# Patient Record
Sex: Female | Born: 1970
Health system: Southern US, Community
[De-identification: ages and names within clinical notes are randomized; demographics above are authoritative.]

## PROBLEM LIST (undated history)

## (undated) DIAGNOSIS — E785 Hyperlipidemia, unspecified: Secondary | ICD-10-CM

## (undated) DIAGNOSIS — I10 Essential (primary) hypertension: Secondary | ICD-10-CM

## (undated) DIAGNOSIS — K219 Gastro-esophageal reflux disease without esophagitis: Secondary | ICD-10-CM

## (undated) DIAGNOSIS — Z8719 Personal history of other diseases of the digestive system: Secondary | ICD-10-CM

## (undated) HISTORY — DX: Hyperlipidemia, unspecified: E78.5

## (undated) HISTORY — DX: Gastro-esophageal reflux disease without esophagitis: K21.9

## (undated) HISTORY — PX: TONSILLECTOMY: SUR1361

## (undated) HISTORY — PX: TUBAL LIGATION: SHX77

## (undated) HISTORY — PX: KNEE SURGERY: SHX244

---

## 2003-03-12 ENCOUNTER — Emergency Department (HOSPITAL_COMMUNITY): Admission: EM | Admit: 2003-03-12 | Discharge: 2003-03-12 | Payer: Self-pay

## 2004-08-26 ENCOUNTER — Emergency Department (HOSPITAL_COMMUNITY): Admission: EM | Admit: 2004-08-26 | Discharge: 2004-08-26 | Payer: Self-pay | Admitting: Emergency Medicine

## 2004-10-11 ENCOUNTER — Ambulatory Visit (HOSPITAL_BASED_OUTPATIENT_CLINIC_OR_DEPARTMENT_OTHER): Admission: RE | Admit: 2004-10-11 | Discharge: 2004-10-11 | Payer: Self-pay | Admitting: Orthopedic Surgery

## 2004-10-11 ENCOUNTER — Ambulatory Visit (HOSPITAL_COMMUNITY): Admission: RE | Admit: 2004-10-11 | Discharge: 2004-10-11 | Payer: Self-pay | Admitting: Orthopedic Surgery

## 2007-01-28 ENCOUNTER — Emergency Department (HOSPITAL_COMMUNITY): Admission: EM | Admit: 2007-01-28 | Discharge: 2007-01-28 | Payer: Self-pay | Admitting: Emergency Medicine

## 2007-03-01 ENCOUNTER — Emergency Department (HOSPITAL_COMMUNITY): Admission: EM | Admit: 2007-03-01 | Discharge: 2007-03-02 | Payer: Self-pay | Admitting: Emergency Medicine

## 2007-05-12 ENCOUNTER — Emergency Department (HOSPITAL_COMMUNITY): Admission: EM | Admit: 2007-05-12 | Discharge: 2007-05-12 | Payer: Self-pay | Admitting: Emergency Medicine

## 2007-05-20 ENCOUNTER — Other Ambulatory Visit (HOSPITAL_COMMUNITY): Admission: RE | Admit: 2007-05-20 | Discharge: 2007-06-08 | Payer: Self-pay | Admitting: Psychiatry

## 2007-06-14 ENCOUNTER — Other Ambulatory Visit: Admission: RE | Admit: 2007-06-14 | Discharge: 2007-06-14 | Payer: Self-pay | Admitting: Gynecology

## 2010-08-02 NOTE — Op Note (Signed)
NAME:  Sharon Hunt, Sharon Hunt              ACCOUNT NO.:  [id number]   MEDICAL RECORD NO.:  [id number]          PATIENT TYPE:  AMB   LOCATION:  DSC                          FACILITY:  [facility]   PHYSICIAN:  Harvie Junior, M.D.   DATE OF BIRTH:  1970/10/12   DATE OF PROCEDURE:  10/11/2004  DATE OF DISCHARGE:                                 OPERATIVE REPORT   PREOPERATIVE DIAGNOSIS:  Medial meniscal tear.   POSTOPERATIVE DIAGNOSES:  1.  Medial meniscal tear  2.  Synovitis, medial and lateral.   OPERATION PERFORMED:  1.  Debridement of anterior horn medial meniscal tear.  2.  Debridement of the synovitis medially and laterally.   SURGEON:  Harvie Junior, M.D.   ASSISTANT:  None.   ANESTHESIA:  General.   INDICATIONS FOR PROCEDURE:  The patient is a 40 year old female with a long  history of slip and fall type injury.  She is having severe type pain,  difficulty ranging the knee, difficulty being examined.  MRI was obtained  which showed an oblique medial meniscal tear and we examined her in the  office.  At that point we felt that she was having pain that was certainly  way out of proportion and could not be simply related to medial meniscal  tear.  We felt it would be important at that point to talk to her about  that, which we did address.  We got her into physical therapy.  We put her  on some nerve type medicine to see if that would not calm her pain down.  She continued to have pain and we felt that ultimately she would need to  have her meniscus tear addressed and felt that it is probably best to do  ahead and get it addressed and then address the other pain issues  simultaneously.  She was brought to the operating room for this procedure.   DESCRIPTION OF PROCEDURE:  The patient was brought to the operating room and  after adequate anesthesia was obtained with general anesthetic, the patient  was placed on the operating table where examination under anesthesia was  stable in all  directions.  Attention was then turned to the left knee which  was prepped and draped in the usual sterile fashion.  Following this,  routine arthroscopic examination of the knee revealed that there was no  significant patellofemoral abnormality, no cartilage abnormalities.  There  was no significant medial shelf plica.  We went down to the medial  compartment and the medial meniscus was probed at length. There was no  separation of the medial meniscus from the capsular structures.  There was  some looseness of the medial meniscus, not dramatic.  There was a small  anterior horn tear which was debrided which went around into an anterior  plica.  The ACL was examined and stressed and noted to be normal.  Lateral  compartment was examined and noted to be normal.  Lateral gutter had a mild  amount of synovitis which was debrided.  The articular surfaces were  evaluated at length with a probe through both  flexion and extension and up  under the patella, the articular cartilage was probed at length to look for  any kind of soft or irregular cartilage.  None was found.  Following this,  the knee was copiously irrigated and suctioned dry.  The arthroscope portals  were closed with a bandage.  Sterile compressive dressing was applied.  The  patient was then transferred to the recovery room where she was noted to be  in satisfactory condition.  Postoperatively I think it would be important  for the patient to be aggressively into physical therapy with early motion.  We may need to treat her for any kind of nerve related issues.  I do not  think her intra-articular pathology would explain the symptoms that she is  having.  We have discussed this preoperatively.  I will see her back in the  office in a week for recheck.      Harvie Junior, M.D.  Electronically Signed     JLG/MEDQ  D:  10/11/2004  T:  10/11/2004  Job:  [id number]

## 2010-10-16 ENCOUNTER — Inpatient Hospital Stay (HOSPITAL_COMMUNITY)
Admission: EM | Admit: 2010-10-16 | Discharge: 2010-10-19 | DRG: 305 | Disposition: A | Discharge status: Home / Self Care | Payer: Medicaid Other | Attending: Internal Medicine | Admitting: Internal Medicine

## 2010-10-16 ENCOUNTER — Emergency Department (HOSPITAL_COMMUNITY): Payer: Medicaid Other

## 2010-10-16 DIAGNOSIS — R1110 Vomiting, unspecified: Secondary | ICD-10-CM | POA: Diagnosis present

## 2010-10-16 DIAGNOSIS — E876 Hypokalemia: Secondary | ICD-10-CM | POA: Diagnosis present

## 2010-10-16 DIAGNOSIS — K449 Diaphragmatic hernia without obstruction or gangrene: Secondary | ICD-10-CM | POA: Diagnosis present

## 2010-10-16 DIAGNOSIS — E785 Hyperlipidemia, unspecified: Secondary | ICD-10-CM | POA: Diagnosis present

## 2010-10-16 DIAGNOSIS — F17200 Nicotine dependence, unspecified, uncomplicated: Secondary | ICD-10-CM | POA: Diagnosis present

## 2010-10-16 DIAGNOSIS — I10 Essential (primary) hypertension: Principal | ICD-10-CM | POA: Diagnosis present

## 2010-10-16 LAB — DIFFERENTIAL
Basophils Absolute: 0 10*3/uL (ref 0.0–0.1)
Basophils Relative: 0 % (ref 0–1)
Eosinophils Absolute: 0.2 10*3/uL (ref 0.0–0.7)
Lymphocytes Relative: 34 % (ref 12–46)
Monocytes Absolute: 0.7 10*3/uL (ref 0.1–1.0)
Monocytes Relative: 9 % (ref 3–12)
Neutrophils Relative %: 55 % (ref 43–77)

## 2010-10-16 LAB — BASIC METABOLIC PANEL
BUN: 5 mg/dL — ABNORMAL LOW (ref 6–23)
CO2: 25 mEq/L (ref 19–32)
GFR calc Af Amer: >60 mL/min (ref >60)
Glucose, Bld: 90 mg/dL (ref 70–99)

## 2010-10-16 LAB — CBC
Hemoglobin: 11.4 g/dL — ABNORMAL LOW (ref 12.0–15.0)
Platelets: 244 10*3/uL (ref 150–400)
WBC: 8 10*3/uL (ref 4.0–10.5)

## 2010-10-16 LAB — CK TOTAL AND CKMB (NOT AT ARMC)
CK, MB: 2.3 ng/mL (ref 0.3–4.0)
Total CK: 172 U/L (ref 7–177)

## 2010-10-16 LAB — TROPONIN I: Troponin I: <0.3 ng/mL (ref <0.30)

## 2010-10-17 ENCOUNTER — Emergency Department (HOSPITAL_COMMUNITY): Payer: Medicaid Other

## 2010-10-17 LAB — CARDIAC PANEL(CRET KIN+CKTOT+MB+TROPI)
CK, MB: 2.1 ng/mL (ref 0.3–4.0)
Relative Index: 1.7 (ref 0.0–2.5)
Relative Index: 1.5 (ref 0.0–2.5)
Total CK: 126 U/L (ref 7–177)
Total CK: 136 U/L (ref 7–177)
Troponin I: <0.3 ng/mL (ref <0.30)
Troponin I: <0.3 ng/mL (ref <0.30)

## 2010-10-17 LAB — LIPASE, BLOOD: Lipase: 14 U/L (ref 11–59)

## 2010-10-17 LAB — LIPID PANEL
Total CHOL/HDL Ratio: 2.5 RATIO
Triglycerides: 56 mg/dL (ref <150)

## 2010-10-17 LAB — T4, FREE: Free T4: 0.89 ng/dL (ref 0.80–1.80)

## 2010-10-17 LAB — T3, FREE: T3, Free: 2.5 pg/mL (ref 2.3–4.2)

## 2010-10-17 MED ORDER — IOHEXOL 350 MG/ML SOLN
100.0000 mL | Freq: Once | INTRAVENOUS | Status: AC | PRN
  Administered 2010-10-17: 100 mL via INTRAVENOUS

## 2010-10-17 NOTE — H&P (Signed)
NAME:  Sharon Hunt, Sharon Hunt              ACCOUNT NO.:  [id number]  MEDICAL RECORD NO.:  [id number]  LOCATION:                                 FACILITY:  PHYSICIAN:  Talmage Nap, MD  DATE OF BIRTH:  12-13-1970  DATE OF ADMISSION:  10/17/2010 DATE OF DISCHARGE:                             HISTORY & PHYSICAL   PRIMARY CARE PHYSICIAN:  Unassigned.  History was obtainable from the patient and the patient's son.  CHIEF COMPLAINT:  Headache and chest pain which started suddenly on October 16, 2010, in the evening.  The patient is a 40 year old African American female with history of hypertension who was said to have been in stable health until about evening of October 16, 2010, when the patient developed sudden occipital headache.  The patient claimed that was the worst headache of her life and was more than 10/10 in intensity.  The occipital head was radiating down to the neck posteriorly with associated nausea.  The patient denied any history of neck stiffness.  She denied any fever.  She denied any chills.  She denied any rigor or nuchal rigidity.She claimed she had precordial discomfort with associated nausea.  She denied any diaphoresis.  She denied any PND.  She denied any orthopnea.  She denied any systemic symptoms, i.e., no fever, no chills, no rigor, and subsequently presented to the emergency room to be evaluated.  In the ED, the patient was seen.  She was found to have a blood pressure of 211/118 and subsequently was given IV Lopressor and clonidine.  By the time the patient was seen by me, she was still complaining of occipital headache, lesser in intensity with nausea but no vomiting, no systemic symptoms, i.e., no fever, no chills, no rigor.  Her blood pressure, however, was now under control, 139/94, pulse was 74, respiratory rate 15.  After evaluating the patient, she was advised to be admitted for stabilization of blood pressure and also to rule out aneurysm  or subarachnoid hemorrhage.  PAST MEDICAL HISTORY:  Positive for hypertension and hiatal hernia.  PAST SURGICAL HISTORY: 1. Tummy tuck surgery. 2. Knee surgery. 3. Tonsillectomy. 4. Tubal ligation.  PREADMISSION MEDICATIONS:  Lisinopril 20 mg p.o. daily and omeprazole.  She has no known allergy.  SOCIAL HISTORY:  The patient smokes one pack of cigarettes over 3-4 days for the past 20 years.  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  FAMILY HISTORY:  York Spaniel to be positive for hypertension and early coronary artery disease.  REVIEW OF SYSTEMS:  The patient complained of occipital headache with nausea.  Denies any vomiting.  No fever.  No chills.  No rigor.  No neck stiffness.  She also complained about chest pain that is generalized with associated nausea.  She also denied any associated fever, chills, or rigor.  Also complained of abdominal discomfort, mainly located in the epigastric region.  Denies any diarrhea or hematochezia.  No dysuria or hematuria.  No swelling of the lower extremities.  No intolerance to heat or cold and no neuropsychiatric disorder.  PHYSICAL EXAMINATION:  GENERAL:  A young lady, overweight, not in acute  respiratory distress but holding tight to her head. INITIAL VITAL SIGNS:  Blood pressure on admission 211/118, pulse 84, respiratory rate 20, however, after IV Lopressor and clonidine, present blood pressure is 113/94, pulse is 74, respiratory rate is 15. HEENT:  Pupils are reactive to light, and extraocular muscles are intact. NECK:  No jugular venous distention.  No carotid bruit.  No lymphadenopathy. CHEST:  Clear to auscultation. HEART:  Heart sounds are one and two. ABDOMEN:  Soft, nontender.  Liver, spleen, and kidney not palpable. Bowel sounds are positive. EXTREMITIES:  No pedal edema. NEUROLOGIC:  Nonfocal. MUSCULOSKELETAL:  Unremarkable. SKIN:  Normal  turgor.  LABORATORY DATA:  Initial hematological indices showed sodium of 141, potassium of 3.4, chloride of 105 with a bicarb of 25, glucose is 90, BUN is 5, creatinine 0.80.  D-dimer is 0.62.  First set of cardiac enzymes:  Troponin I less than 0.30.  Hematological indices showed WBC of 8.0, hemoglobin of 11.4, hematocrit of 32.3, MCV of 74.4 with a platelet count of 244 with normal differentials.  IMAGING STUDIES DONE:  CT of the thorax which was negative for pulmonary embolism. EKG showed normal sinus rhythm with a rate of 80 and LVH.  IMPRESSION: 1. Occipital headache, to rule out aneurysm or subarachnoid     hemorrhage. 2. Hypertensive emergency. 3. Hiatal hernia.  Plan is to admit the patient to telemetry.  The patient's blood pressure will be continued to be maintained with Lopressor 50 mg p.o. b.i.d. and lisinopril 10 mg p.o. b.i.d.  She will be on Zofran 4 mg IV q.4 h. p.r.n. for nausea and morphine 2 mg IV q.4 h. p.r.n. for pain.  Since the patient is found to be slightly hypokalemic, she will be on KCl 10 mEq p.o. b.i.d.  GI prophylaxis will be done with Protonix 40 mg IV q.24 h. and DVT prophylaxis with TED stockings.  Further workup to be done on this patient will include cardiac enzymes q.6 h. x3, thyroid panel which will include TSH, T3, and T4, lipid panel, and 2-D echo.  Imaging study to be done will include CT of the brain with and without contrast.  The patient will be followed and evaluated on daily basis.     Talmage Nap, MD     CN/MEDQ  D:  10/17/2010  T:  10/17/2010  Job:  [id number]  Electronically Signed by Talmage Nap  on 10/17/2010 06:49:28 PM

## 2010-10-18 ENCOUNTER — Inpatient Hospital Stay (HOSPITAL_COMMUNITY): Payer: Medicaid Other

## 2010-10-18 LAB — BASIC METABOLIC PANEL
Calcium: 10.1 mg/dL (ref 8.4–10.5)
Creatinine, Ser: 0.82 mg/dL (ref 0.50–1.10)
GFR calc Af Amer: >60 mL/min (ref >60)

## 2010-10-18 MED ORDER — GADOBENATE DIMEGLUMINE 529 MG/ML IV SOLN
16.0000 mL | Freq: Once | INTRAVENOUS | Status: AC | PRN
  Administered 2010-10-18: 16 mL via INTRAVENOUS

## 2010-11-27 NOTE — Discharge Summary (Signed)
  NAMEFRANKLIN, CLAPSADDLE              ACCOUNT NO.:  [id number]  MEDICAL RECORD NO.:  [id number]  LOCATION:  5506                         FACILITY:  [facility]  PHYSICIAN:  Labresha Mellor I Tamir Wallman, MD      DATE OF BIRTH:  1970-11-21  DATE OF ADMISSION:  10/16/2010 DATE OF DISCHARGE:  10/19/2010                              DISCHARGE SUMMARY   PRIMARY CARE PHYSICIAN:  Unassigned.  DISCHARGE DIAGNOSES: 1. Hypertension urgency. 2. Headache, unlikely from hypertensive urgency. 3. Acute-on-chronic vomiting, resolved. 4. Hypokalemia.  DISCHARGE MEDICATIONS: 1. Lisinopril/hydrochlorothiazide 20/25 one tablet daily. 2. Norvasc 5 mg p.o. daily. 3. Ativan 0.5 mg p.o. daily p.r.n.  CONSULTATION:  None.  PROCEDURE: 1. Chest x-ray.  No active disease. 2. CT head without contrast.  Negative. 3. MRI of the brain.  No evidence of empty sella or dilated perioptic     space, mild exophthalmos, no acute infarct, scattered minimal     punctate, nonspecific white matter-type changes.  Minimal partial     opacification, inferior aspect of right mastoid air cells. 4. 2-D echo.  EF of 60-65%, wall motion within normal.  There were no     regional wall motion abnormalities.  HISTORY OF PRESENT ILLNESS:  This is a 40 year old African American female with history of hypertension.  On the evening of August 3, the patient developed occipital headache and headache was radiating to the neck posteriorly with associated nausea.  Denies any history of neck stiffness.  Denies any fever.  Associated with bradycardia, discomfort, and nausea.  No fever, no chills.  In the ED, the patient found to have blood pressure of 211/118. 1. Hypertensive urgency.  Blood pressure improved and currently the     patient will be discharge with lisinopril/HCTZ 20/25 mg p.o. daily     and Norvasc 5 mg p.o. daily. 2. Hyperlipidemia.  The patient educated about diet and within 6     months, if there is no significant improvement, medication  will be     indicated. 3. Chest pain, atypical.  Cardiac enzymes cycled, which were negative     and 2-D echo did show EF of 60% with normal EF. 4. Headache, which could be related to the hypertension urgency.  MRI     of the brain done, which did not show     any evidence of hemorrhage or aneurysm.  Also, CT head done, was     negative.  Currently, headache completely resolved.  Currently, we     felt the patient is medically stable for discharge. 5. Follow up wit her primary care as early as next week.  The patient     informed.     Ronneisha Jett Bosie Helper, MD     HIE/MEDQ  D:  10/19/2010  T:  10/19/2010  Job:  [id number]  Electronically Signed by Ebony Cargo MD on 11/27/2010 03:33:25 PM

## 2010-12-06 LAB — POCT CARDIAC MARKERS
CKMB, poc: <1 — ABNORMAL LOW
Myoglobin, poc: 56.8
Troponin i, poc: <0.05

## 2010-12-06 LAB — I-STAT 8, (EC8 V) (CONVERTED LAB)
BUN: 13
Bicarbonate: 23.6
Glucose, Bld: 73
TCO2: 25
pH, Ven: 7.394 — ABNORMAL HIGH

## 2010-12-06 LAB — POCT I-STAT CREATININE: Operator id: 288331

## 2010-12-20 LAB — I-STAT 8, (EC8 V) (CONVERTED LAB)
Acid-Base Excess: 1
Bicarbonate: 26.3 — ABNORMAL HIGH
HCT: 40
Hemoglobin: 13.6
Operator id: 151321
Sodium: 134 — ABNORMAL LOW
TCO2: 28
pCO2, Ven: 41.7 — ABNORMAL LOW

## 2010-12-20 LAB — DIFFERENTIAL
Basophils Absolute: 0.1
Basophils Relative: 0
Lymphocytes Relative: 11 — ABNORMAL LOW
Monocytes Absolute: 1
Neutro Abs: 14.8 — ABNORMAL HIGH
Neutrophils Relative %: 83 — ABNORMAL HIGH

## 2010-12-20 LAB — PREGNANCY, URINE: Preg Test, Ur: NEGATIVE

## 2010-12-20 LAB — CBC
Hemoglobin: 12.4
RBC: 4.36
RDW: 17 — ABNORMAL HIGH

## 2010-12-20 LAB — URINE CULTURE

## 2010-12-20 LAB — URINALYSIS, ROUTINE W REFLEX MICROSCOPIC
Glucose, UA: 100 — AB
Ketones, ur: 15 — AB
Nitrite: POSITIVE — AB
Specific Gravity, Urine: 1.036 — ABNORMAL HIGH
pH: 5.5

## 2010-12-20 LAB — HEPATIC FUNCTION PANEL
ALT: 10
AST: 42 — ABNORMAL HIGH
Albumin: 3.6
Bilirubin, Direct: 0.7 — ABNORMAL HIGH
Total Protein: 6.7

## 2010-12-20 LAB — URINE MICROSCOPIC-ADD ON

## 2010-12-20 LAB — WET PREP, GENITAL
Clue Cells Wet Prep HPF POC: NONE SEEN
Trich, Wet Prep: NONE SEEN
Yeast Wet Prep HPF POC: NONE SEEN

## 2010-12-20 LAB — POCT I-STAT CREATININE: Creatinine, Ser: 1.1

## 2010-12-20 LAB — POTASSIUM: Potassium: 3.9

## 2010-12-20 LAB — GC/CHLAMYDIA PROBE AMP, GENITAL: GC Probe Amp, Genital: POSITIVE — AB

## 2010-12-24 LAB — COMPREHENSIVE METABOLIC PANEL
AST: 13
Albumin: 3.4 — ABNORMAL LOW
BUN: 6
Creatinine, Ser: 0.83
GFR calc Af Amer: >60
Total Protein: 6.5

## 2010-12-24 LAB — CBC
HCT: 33.7 — ABNORMAL LOW
Hemoglobin: 10.9 — ABNORMAL LOW
MCHC: 32.3
Platelets: 338
RDW: 12.7

## 2010-12-24 LAB — URINALYSIS, ROUTINE W REFLEX MICROSCOPIC
Hgb urine dipstick: NEGATIVE
Nitrite: NEGATIVE
Protein, ur: NEGATIVE
Urobilinogen, UA: 0.2

## 2010-12-24 LAB — DIFFERENTIAL
Eosinophils Relative: 3
Lymphocytes Relative: 14
Lymphs Abs: 1.2
Monocytes Absolute: 0.2
Monocytes Relative: 2 — ABNORMAL LOW
Neutro Abs: 6.9

## 2011-08-19 DIAGNOSIS — E876 Hypokalemia: Secondary | ICD-10-CM | POA: Diagnosis present

## 2011-08-20 ENCOUNTER — Inpatient Hospital Stay (HOSPITAL_COMMUNITY)
Admission: EM | Admit: 2011-08-20 | Discharge: 2011-08-22 | DRG: 556 | Disposition: A | Discharge status: Home / Self Care | Payer: MEDICAID | Source: Ambulatory Visit | Attending: Family Medicine | Admitting: Family Medicine

## 2011-08-20 ENCOUNTER — Emergency Department (HOSPITAL_COMMUNITY): Payer: Self-pay

## 2011-08-20 ENCOUNTER — Encounter (HOSPITAL_COMMUNITY): Payer: Self-pay | Admitting: Emergency Medicine

## 2011-08-20 DIAGNOSIS — F1010 Alcohol abuse, uncomplicated: Secondary | ICD-10-CM | POA: Diagnosis present

## 2011-08-20 DIAGNOSIS — F10929 Alcohol use, unspecified with intoxication, unspecified: Secondary | ICD-10-CM | POA: Diagnosis present

## 2011-08-20 DIAGNOSIS — E876 Hypokalemia: Secondary | ICD-10-CM | POA: Diagnosis present

## 2011-08-20 DIAGNOSIS — I498 Other specified cardiac arrhythmias: Secondary | ICD-10-CM | POA: Diagnosis not present

## 2011-08-20 DIAGNOSIS — E538 Deficiency of other specified B group vitamins: Secondary | ICD-10-CM | POA: Diagnosis present

## 2011-08-20 DIAGNOSIS — D509 Iron deficiency anemia, unspecified: Secondary | ICD-10-CM | POA: Diagnosis present

## 2011-08-20 DIAGNOSIS — G40401 Other generalized epilepsy and epileptic syndromes, not intractable, with status epilepticus: Secondary | ICD-10-CM

## 2011-08-20 DIAGNOSIS — F17200 Nicotine dependence, unspecified, uncomplicated: Secondary | ICD-10-CM | POA: Diagnosis present

## 2011-08-20 DIAGNOSIS — R531 Weakness: Secondary | ICD-10-CM | POA: Diagnosis present

## 2011-08-20 DIAGNOSIS — R29898 Other symptoms and signs involving the musculoskeletal system: Principal | ICD-10-CM | POA: Diagnosis present

## 2011-08-20 DIAGNOSIS — Z7189 Other specified counseling: Secondary | ICD-10-CM

## 2011-08-20 DIAGNOSIS — I10 Essential (primary) hypertension: Secondary | ICD-10-CM | POA: Diagnosis present

## 2011-08-20 DIAGNOSIS — R404 Transient alteration of awareness: Secondary | ICD-10-CM

## 2011-08-20 DIAGNOSIS — E611 Iron deficiency: Secondary | ICD-10-CM | POA: Diagnosis present

## 2011-08-20 DIAGNOSIS — Z7982 Long term (current) use of aspirin: Secondary | ICD-10-CM

## 2011-08-20 DIAGNOSIS — R7989 Other specified abnormal findings of blood chemistry: Secondary | ICD-10-CM | POA: Diagnosis present

## 2011-08-20 DIAGNOSIS — Z9889 Other specified postprocedural states: Secondary | ICD-10-CM

## 2011-08-20 DIAGNOSIS — E785 Hyperlipidemia, unspecified: Secondary | ICD-10-CM | POA: Diagnosis present

## 2011-08-20 DIAGNOSIS — Z720 Tobacco use: Secondary | ICD-10-CM | POA: Diagnosis present

## 2011-08-20 DIAGNOSIS — Z79899 Other long term (current) drug therapy: Secondary | ICD-10-CM

## 2011-08-20 HISTORY — DX: Personal history of other diseases of the digestive system: Z87.19

## 2011-08-20 HISTORY — DX: Essential (primary) hypertension: I10

## 2011-08-20 LAB — COMPREHENSIVE METABOLIC PANEL
AST: 37 U/L (ref 0–37)
Albumin: 3.8 g/dL (ref 3.5–5.2)
Calcium: 8.9 mg/dL (ref 8.4–10.5)
Chloride: 105 mEq/L (ref 96–112)
Creatinine, Ser: 0.86 mg/dL (ref 0.50–1.10)
Sodium: 140 mEq/L (ref 135–145)
Total Bilirubin: 0.3 mg/dL (ref 0.3–1.2)

## 2011-08-20 LAB — CBC
MCH: 28.8 pg (ref 26.0–34.0)
Platelets: 203 10*3/uL (ref 150–400)
RBC: 3.99 MIL/uL (ref 3.87–5.11)
WBC: 9 10*3/uL (ref 4.0–10.5)

## 2011-08-20 LAB — APTT: aPTT: 27 seconds (ref 24–37)

## 2011-08-20 LAB — PROTIME-INR
INR: 1.02 (ref 0.00–1.49)
Prothrombin Time: 13.6 seconds (ref 11.6–15.2)

## 2011-08-20 LAB — GLUCOSE, CAPILLARY

## 2011-08-20 LAB — CK TOTAL AND CKMB (NOT AT ARMC)
CK, MB: 2.4 ng/mL (ref 0.3–4.0)
Relative Index: 0.8 (ref 0.0–2.5)

## 2011-08-20 LAB — RAPID URINE DRUG SCREEN, HOSP PERFORMED
Cocaine: NOT DETECTED
Opiates: NOT DETECTED

## 2011-08-20 MED ORDER — ONDANSETRON HCL 4 MG/2ML IJ SOLN
4.0000 mg | Freq: Once | INTRAMUSCULAR | Status: AC
  Administered 2011-08-20: 4 mg via INTRAVENOUS

## 2011-08-20 MED ORDER — LORAZEPAM 1 MG PO TABS
1.0000 mg | ORAL_TABLET | Freq: Four times a day (QID) | ORAL | Status: DC | PRN
  Filled 2011-08-20: qty 1

## 2011-08-20 MED ORDER — LORAZEPAM 2 MG/ML IJ SOLN: 0.0000 mg | Freq: Two times a day (BID) | INTRAMUSCULAR | Status: DC

## 2011-08-20 MED ORDER — THIAMINE HCL 100 MG/ML IJ SOLN: 100.0000 mg | Freq: Every day | INTRAMUSCULAR | Status: DC

## 2011-08-20 MED ORDER — LORAZEPAM 2 MG/ML IJ SOLN
0.0000 mg | Freq: Four times a day (QID) | INTRAMUSCULAR | Status: DC
  Administered 2011-08-20: 1 mg via INTRAVENOUS

## 2011-08-20 MED ORDER — FOLIC ACID 1 MG PO TABS: 1.0000 mg | ORAL_TABLET | Freq: Every day | ORAL | Status: DC

## 2011-08-20 MED ORDER — ADULT MULTIVITAMIN W/MINERALS CH
1.0000 | ORAL_TABLET | Freq: Every day | ORAL | Status: DC
  Filled 2011-08-20: qty 1

## 2011-08-20 MED ORDER — ONDANSETRON HCL 4 MG/2ML IJ SOLN: 4.0000 mg | Freq: Three times a day (TID) | INTRAMUSCULAR | Status: DC | PRN

## 2011-08-20 MED ORDER — SODIUM CHLORIDE 0.9 % IV SOLN
INTRAVENOUS | Status: DC
  Administered 2011-08-21: 75 mL/h via INTRAVENOUS

## 2011-08-20 MED ORDER — VITAMIN B-1 100 MG PO TABS: 100.0000 mg | ORAL_TABLET | Freq: Every day | ORAL | Status: DC

## 2011-08-20 MED ORDER — LORAZEPAM 2 MG/ML IJ SOLN
1.0000 mg | Freq: Four times a day (QID) | INTRAMUSCULAR | Status: DC | PRN
  Filled 2011-08-20: qty 1

## 2011-08-20 MED ORDER — ONDANSETRON HCL 4 MG/2ML IJ SOLN
INTRAMUSCULAR | Status: AC
  Filled 2011-08-20: qty 2

## 2011-08-20 NOTE — ED Notes (Signed)
Per EMS- pt was mowing the lawn when she collapsed. Her son called EMS. Pt had a couple of drinks prior to mowing the lawn. Pt has left sided facial droop and weakness. Pt stuttering but not slurring words. Pt also c/o chest pain. EMS didn't give any meds d/t pt unable speak clearly. EMS started IV 18G in Rt AC.

## 2011-08-20 NOTE — Code Documentation (Signed)
Patient arrived via EMS after being found by son's in grass while mowing lawn at 2140, patient admits having two wine coolers prior to mowing. Code stroke called at 2207, patient arrived at 2218, EDP exam at 2218, stroke team arrived at 2211, LSN 2140, patient arrived to CT at 2220, phlebotomist arrived at 2220, CT read by Dr. Roseanne Reno at 2230. Initial NIH 7 due to left side weakness, aphasia, and facial droop. UDS ordered Patient improved to NIH 2 with slight left arm weakness and aphasia. Failed swallow screen. Code stroke cancelled at 2248 by Dr. Roseanne Reno.

## 2011-08-20 NOTE — ED Notes (Signed)
Pt no longer has facial droop.

## 2011-08-20 NOTE — ED Notes (Signed)
Family in at bedside; plan of care discussed; pt attempting to get out of bed - instructed to stay in bed for safety reasons; family to assist in ensuring pt will stay in bed; stretcher in low position; side rails up x2

## 2011-08-20 NOTE — ED Notes (Signed)
Code Stroke cancelled, per Dr. Vonita Moss.

## 2011-08-20 NOTE — Consult Note (Signed)
[facility] CONSULT NOTE     Reason for Consult: Unresponsive followed by confusion and speech difficulty as well as reduced movement of left extremities.    HPI:    Sharon Hunt is an 41 y.o. female history of hypertension presenting following an episode of unresponsiveness followed by confusion as well as reduce speech output and reduced movement of left extremities. Patient was brought to the emergency room and code stroke status. Was noted to have reduce speech output but no dysarthria. She had good strength of left extremities and no facial asymmetry. There was reduced movement of left extremities voluntarily, compared to right extremities. CT scan of her head showed no acute intracranial abnormality. No previous history of loss of consciousness. Patient admits to alcohol consumption daily. Alcohol level is pending, as is urine drug screen. Patient's speech returned to normal in the emergency room after about 20-30 minutes. Speech and movement of her left extremities also returned to normal. Code stroke was canceled.   Past Medical History  Diagnosis Date  . Hypertension     History reviewed. No pertinent past surgical history.  History reviewed. No pertinent family history.  Social History:  does not have a smoking history on file. She does not have any smokeless tobacco history on file. She reports that she drinks alcohol. Her drug history not on file.  No Known Allergies  Medications:    Prior to Admission:  Norvasc 5 mg per day Aspirin 81 mg per day Lisinopril-hydrochlorothiazide 20-25 one tablet daily  Blood pressure 157/100, pulse 88, temperature 97.8 F (36.6 C), temperature source Oral, resp. rate 34, SpO2 100.00%.   Neurologic Examination:  Mental Status: Alert, oriented x3. Somewhat tremulous but no acute distress Speech fluent without evidence of aphasia. Able to follow commands without difficulty. Cranial Nerves: II-Visual  fields were normal. III/IV/VI-Pupils were equal and reacted. Extraocular movements were full and conjugate.    V/VII-no facial numbness and no facial weakness. VIII-normal. X-normal speech. XII-midline tongue extension Motor: 5/5 bilaterally with normal tone and bulk Sensory: Normal throughout. Deep Tendon Reflexes: Trace to 1+ and symmetric. Plantars: Flexor bilaterally Cerebellar: Normal finger-to-nose testing.   Lab Results  Component Value Date/Time   CHOL 203* 10/17/2010  6:40 AM    Results for orders placed during the hospital encounter of 08/20/11 (from the past 48 hour(s))  PROTIME-INR     Status: Normal   Collection Time   08/20/11 10:22 PM      Component Value Range Comment   Prothrombin Time 13.6  11.6 - 15.2 (seconds)    INR 1.02  0.00 - 1.49    APTT     Status: Normal   Collection Time   08/20/11 10:22 PM      Component Value Range Comment   aPTT 27  24 - 37 (seconds)   CBC     Status: Abnormal   Collection Time   08/20/11 10:22 PM      Component Value Range Comment   WBC 9.0  4.0 - 10.5 (K/uL)    RBC 3.99  3.87 - 5.11 (MIL/uL)    Hemoglobin 11.5 (*) 12.0 - 15.0 (g/dL)    HCT 82.9 (*) 56.2 - 46.0 (%)    MCV 82.7  78.0 - 100.0 (fL)    MCH 28.8  26.0 - 34.0 (pg)    MCHC 34.8  30.0 - 36.0 (g/dL)  RDW 21.4 (*) 11.5 - 15.5 (%)    Platelets 203  150 - 400 (K/uL)   DIFFERENTIAL     Status: Normal (Preliminary result)   Collection Time   08/20/11 10:22 PM      Component Value Range Comment   Neutrophils Relative PENDING  43 - 77 (%)    Neutro Abs PENDING  1.7 - 7.7 (K/uL)    Band Neutrophils PENDING  0 - 10 (%)    Lymphocytes Relative PENDING  12 - 46 (%)    Lymphs Abs PENDING  0.7 - 4.0 (K/uL)    Monocytes Relative PENDING  3 - 12 (%)    Monocytes Absolute PENDING  0.1 - 1.0 (K/uL)    Eosinophils Relative PENDING  0 - 5 (%)    Eosinophils Absolute PENDING  0.0 - 0.7 (K/uL)    Basophils Relative PENDING  0 - 1 (%)    Basophils Absolute PENDING  0.0 - 0.1 (K/uL)      WBC Morphology PENDING      RBC Morphology PENDING      Smear Review PENDING      nRBC PENDING  0 (/100 WBC)    Metamyelocytes Relative PENDING      Myelocytes PENDING      Promyelocytes Absolute PENDING      Blasts PENDING     COMPREHENSIVE METABOLIC PANEL     Status: Abnormal   Collection Time   08/20/11 10:22 PM      Component Value Range Comment   Sodium 140  135 - 145 (mEq/L)    Potassium 3.1 (*) 3.5 - 5.1 (mEq/L)    Chloride 105  96 - 112 (mEq/L)    CO2 22  19 - 32 (mEq/L)    Glucose, Bld 99  70 - 99 (mg/dL)    BUN 7  6 - 23 (mg/dL)    Creatinine, Ser 1.61  0.50 - 1.10 (mg/dL)    Calcium 8.9  8.4 - 10.5 (mg/dL)    Total Protein 6.8  6.0 - 8.3 (g/dL)    Albumin 3.8  3.5 - 5.2 (g/dL)    AST 37  0 - 37 (U/L)    ALT 16  0 - 35 (U/L)    Alkaline Phosphatase 72  39 - 117 (U/L)    Total Bilirubin 0.3  0.3 - 1.2 (mg/dL)    GFR calc non Af Amer 82 (*) >90 (mL/min)    GFR calc Af Amer >90  >90 (mL/min)   CK TOTAL AND CKMB     Status: Abnormal   Collection Time   08/20/11 10:22 PM      Component Value Range Comment   Total CK 318 (*) 7 - 177 (U/L)    CK, MB 2.4  0.3 - 4.0 (ng/mL)    Relative Index 0.8  0.0 - 2.5    TROPONIN I     Status: Normal   Collection Time   08/20/11 10:22 PM      Component Value Range Comment   Troponin I <0.30  <0.30 (ng/mL)   GLUCOSE, CAPILLARY     Status: Abnormal   Collection Time   08/20/11 10:46 PM      Component Value Range Comment   Glucose-Capillary 107 (*) 70 - 99 (mg/dL)    Comment 1 Documented in Chart      Comment 2 Notify RN       Ct Head Wo Contrast  08/20/2011  *RADIOLOGY REPORT*  Clinical Data: Left-sided weakness.  Code stroke.  CT HEAD WITHOUT CONTRAST  Technique:  Contiguous axial images were obtained from the base of the skull through the vertex without contrast.  Comparison: Brain MRI 10/18/2010.  Findings: No acute intracranial abnormalities.  Specifically, no definite evidence of acute/subacute cerebral ischemia, no acute  intracranial hemorrhage, no focal mass, mass effect, hydrocephalus or abnormal intra or extra-axial fluid collections.  No acute displaced skull fractures are identified.  Visualized paranasal sinuses and mastoids are well pneumatized.  IMPRESSION: 1.  No acute intracranial abnormalities. 2.  The appearance the brain is normal.  These results were called by telephone on 08/20/2011  at  10:32 p.m. to  Dr. Manus Gunning, who verbally acknowledged these results.  Original Report Authenticated By: Florencia Reasons, M.D.     Assessment/Plan:  Probable generalized seizure followed by postictal state with confusion with reduced speech output as well as reduced weakness of left extremities transiently. Patient is somewhat tremulous and I am suspicious that she may actually consume more alcohol than she admits to a daily basis and may have experienced a withdrawal seizure.  Recommendations: 1. No anticonvulsant medication at this point. 2. Alcohol withdrawal precautions and management 3. Seizure precautions 4. MRI of the brain without and with contrast 5. Routine EEG  Venetia Maxon M.D. [facility] [phone]  08/20/2011, 11:25 PM

## 2011-08-20 NOTE — ED Provider Notes (Signed)
History     CSN: [id number]  Arrival date & time 08/20/11  2220   First MD Initiated Contact with Patient 08/20/11 2237      Chief Complaint  Patient presents with  . Code Stroke    (Consider location/radiation/quality/duration/timing/severity/associated sxs/prior treatment) HPI Comments: Per EMS, patient collapsed in the yard while mowing the lawn. Found by son to be confused, difficulty talking, L sided weakness.  Code stroke called PTA.  Neurology at bedside on arrival. Admits to alcohol use today.  Patient tearful, anxious, follows commands, reduced speech without dysarthria. Questionable complaint of chest pain earlier.  The history is provided by the EMS personnel. The history is limited by the condition of the patient.    Past Medical History  Diagnosis Date  . Hypertension     History reviewed. No pertinent past surgical history.  History reviewed. No pertinent family history.  History  Substance Use Topics  . Smoking status: Not on file  . Smokeless tobacco: Not on file  . Alcohol Use: Yes    OB History    Grav Para Term Preterm Abortions TAB SAB Ect Mult Living                  Review of Systems  Unable to perform ROS   Allergies  Review of patient's allergies indicates no known allergies.  Home Medications   Current Outpatient Rx  Name Route Sig Dispense Refill  . AMLODIPINE BESYLATE 5 MG PO TABS Oral Take 5 mg by mouth daily.    . ASPIRIN 81 MG PO CHEW Oral Chew 81 mg by mouth daily.    Marland Kitchen LISINOPRIL-HYDROCHLOROTHIAZIDE 20-25 MG PO TABS Oral Take 1 tablet by mouth daily.      BP 157/100  Pulse 88  Temp(Src) 97.8 F (36.6 C) (Oral)  Resp 34  SpO2 100%  Physical Exam  Constitutional: She is oriented to person, place, and time. She appears well-developed. No distress.  HENT:  Head: Normocephalic and atraumatic.  Mouth/Throat: Oropharynx is clear and moist. No oropharyngeal exudate.       L sided facial droop, tongue midline  Eyes:  Conjunctivae and EOM are normal. Pupils are equal, round, and reactive to light.  Neck: Normal range of motion. Neck supple.  Cardiovascular: Normal rate, regular rhythm and normal heart sounds.   No murmur heard. Pulmonary/Chest: Effort normal and breath sounds normal. No respiratory distress.  Abdominal: Soft. There is no tenderness. There is no rebound and no guarding.  Musculoskeletal: Normal range of motion. She exhibits no edema and no tenderness.  Neurological: She is alert and oriented to person, place, and time. A cranial nerve deficit is present.       4/5 LUE, 4/5LLE, 5/5 RLE, 5/5 RUE.  See neurology notes for complete exam  Skin: Skin is warm.    ED Course  Procedures (including critical care time)  Labs Reviewed  CBC - Abnormal; Notable for the following:    Hemoglobin 11.5 (*)    HCT 33.0 (*)    RDW 21.4 (*)    All other components within normal limits  COMPREHENSIVE METABOLIC PANEL - Abnormal; Notable for the following:    Potassium 3.1 (*)    GFR calc non Af Amer 82 (*)    All other components within normal limits  CK TOTAL AND CKMB - Abnormal; Notable for the following:    Total CK 318 (*)    All other components within normal limits  GLUCOSE, CAPILLARY - Abnormal; Notable for  the following:    Glucose-Capillary 107 (*)    All other components within normal limits  PROTIME-INR  APTT  DIFFERENTIAL  TROPONIN I  URINE RAPID DRUG SCREEN (HOSP PERFORMED)  ETHANOL   Ct Head Wo Contrast  08/20/2011  *RADIOLOGY REPORT*  Clinical Data: Left-sided weakness.  Code stroke.  CT HEAD WITHOUT CONTRAST  Technique:  Contiguous axial images were obtained from the base of the skull through the vertex without contrast.  Comparison: Brain MRI 10/18/2010.  Findings: No acute intracranial abnormalities.  Specifically, no definite evidence of acute/subacute cerebral ischemia, no acute intracranial hemorrhage, no focal mass, mass effect, hydrocephalus or abnormal intra or extra-axial  fluid collections.  No acute displaced skull fractures are identified.  Visualized paranasal sinuses and mastoids are well pneumatized.  IMPRESSION: 1.  No acute intracranial abnormalities. 2.  The appearance the brain is normal.  These results were called by telephone on 08/20/2011  at  10:32 p.m. to  Dr. Manus Gunning, who verbally acknowledged these results.  Original Report Authenticated By: Florencia Reasons, M.D.     No diagnosis found.    MDM  Episode of collapsing and unresponsiveness at home. Patient arrived with left-sided facial droop and left-sided weakness. Protecting her airway and following commands. Family arrives and states that she was mowing the lawn and collapsed. she had a few alcoholic drinks today.  CT neg.  Patient improved back to baseline after about 20 minutes in ED.  Not a thrombolytic candidate given uncertain time of onset and rapidly improving symptoms.  Neurology suspects postictal state from possible alcohol withdrawal seizure. Patient states she usually drinks 4-5 daily and today had 2.  CIWA begun.  Patient with baseline speech and mentation but tremulous, not tachycardic.  Admission for further workup d/w hospitalist.      Date: 08/20/2011  Rate: 79  Rhythm: normal sinus rhythm  QRS Axis: normal  Intervals: normal  ST/T Wave abnormalities: normal  Conduction Disutrbances:none  Narrative Interpretation:   Old EKG Reviewed: unchanged  CRITICAL CARE Performed by: Glynn Octave   Total critical care time: 30  Critical care time was exclusive of separately billable procedures and treating other patients.  Critical care was necessary to treat or prevent imminent or life-threatening deterioration.  Critical care was time spent personally by me on the following activities: development of treatment plan with patient and/or surrogate as well as nursing, discussions with consultants, evaluation of patient's response to treatment, examination of patient,  obtaining history from patient or surrogate, ordering and performing treatments and interventions, ordering and review of laboratory studies, ordering and review of radiographic studies, pulse oximetry and re-evaluation of patient's condition.   Glynn Octave, MD 08/21/11 [phone]

## 2011-08-20 NOTE — ED Notes (Signed)
Pt chewing on left jaw and twitching noted to left side of face.

## 2011-08-20 NOTE — ED Notes (Addendum)
Pt now talking in full sentences. Pt reports she is embarrassed about her high blood pressure.

## 2011-08-20 NOTE — ED Notes (Signed)
Neurologist in to speak with pt and family - plan of care discussed

## 2011-08-20 NOTE — ED Notes (Signed)
Dr. Roseanne Reno at bedside to assess pt. Pt A&O X 4, pt denies pain. Pt keeps saying she wants to go home. Pt is restless in bed, wont stop moving arms and legs.

## 2011-08-21 ENCOUNTER — Inpatient Hospital Stay (HOSPITAL_COMMUNITY): Payer: Self-pay

## 2011-08-21 ENCOUNTER — Encounter (HOSPITAL_COMMUNITY): Payer: Self-pay | Admitting: Internal Medicine

## 2011-08-21 DIAGNOSIS — F1010 Alcohol abuse, uncomplicated: Secondary | ICD-10-CM

## 2011-08-21 DIAGNOSIS — I10 Essential (primary) hypertension: Secondary | ICD-10-CM | POA: Diagnosis present

## 2011-08-21 DIAGNOSIS — G8190 Hemiplegia, unspecified affecting unspecified side: Secondary | ICD-10-CM

## 2011-08-21 DIAGNOSIS — Z720 Tobacco use: Secondary | ICD-10-CM | POA: Diagnosis present

## 2011-08-21 DIAGNOSIS — R404 Transient alteration of awareness: Secondary | ICD-10-CM

## 2011-08-21 DIAGNOSIS — R531 Weakness: Secondary | ICD-10-CM | POA: Diagnosis present

## 2011-08-21 DIAGNOSIS — E538 Deficiency of other specified B group vitamins: Secondary | ICD-10-CM | POA: Diagnosis present

## 2011-08-21 DIAGNOSIS — I6340 Cerebral infarction due to embolism of unspecified cerebral artery: Secondary | ICD-10-CM

## 2011-08-21 DIAGNOSIS — D509 Iron deficiency anemia, unspecified: Secondary | ICD-10-CM | POA: Insufficient documentation

## 2011-08-21 DIAGNOSIS — E611 Iron deficiency: Secondary | ICD-10-CM | POA: Diagnosis present

## 2011-08-21 DIAGNOSIS — F10929 Alcohol use, unspecified with intoxication, unspecified: Secondary | ICD-10-CM | POA: Diagnosis present

## 2011-08-21 DIAGNOSIS — G40401 Other generalized epilepsy and epileptic syndromes, not intractable, with status epilepticus: Secondary | ICD-10-CM

## 2011-08-21 LAB — FERRITIN: Ferritin: 12 ng/mL (ref 10–291)

## 2011-08-21 LAB — FOLATE: Folate: 2.6 ng/mL — ABNORMAL LOW

## 2011-08-21 LAB — COMPREHENSIVE METABOLIC PANEL WITH GFR
ALT: 13 U/L (ref 0–35)
AST: 29 U/L (ref 0–37)
Albumin: 3.3 g/dL — ABNORMAL LOW (ref 3.5–5.2)
Alkaline Phosphatase: 61 U/L (ref 39–117)
BUN: 7 mg/dL (ref 6–23)
CO2: 21 meq/L (ref 19–32)
Calcium: 8.3 mg/dL — ABNORMAL LOW (ref 8.4–10.5)
Chloride: 108 meq/L (ref 96–112)
Creatinine, Ser: 0.87 mg/dL (ref 0.50–1.10)
GFR calc Af Amer: >90 mL/min
GFR calc non Af Amer: 81 mL/min — ABNORMAL LOW
Glucose, Bld: 82 mg/dL (ref 70–99)
Potassium: 3.1 meq/L — ABNORMAL LOW (ref 3.5–5.1)
Sodium: 144 meq/L (ref 135–145)
Total Bilirubin: 0.3 mg/dL (ref 0.3–1.2)
Total Protein: 6 g/dL (ref 6.0–8.3)

## 2011-08-21 LAB — DIFFERENTIAL
Eosinophils Relative: 2 % (ref 0–5)
Lymphocytes Relative: 49 % — ABNORMAL HIGH (ref 12–46)
Monocytes Absolute: 0.6 10*3/uL (ref 0.1–1.0)
Neutrophils Relative %: 41 % — ABNORMAL LOW (ref 43–77)

## 2011-08-21 LAB — RAPID URINE DRUG SCREEN, HOSP PERFORMED
Barbiturates: NOT DETECTED
Cocaine: NOT DETECTED

## 2011-08-21 LAB — CARDIAC PANEL(CRET KIN+CKTOT+MB+TROPI)
CK, MB: 2.7 ng/mL (ref 0.3–4.0)
CK, MB: 2.7 ng/mL (ref 0.3–4.0)
Relative Index: 0.8 (ref 0.0–2.5)
Troponin I: <0.3 ng/mL (ref <0.30)
Troponin I: <0.3 ng/mL (ref <0.30)

## 2011-08-21 LAB — HEMOGLOBIN A1C
Hgb A1c MFr Bld: 5.2 %
Mean Plasma Glucose: 103 mg/dL

## 2011-08-21 LAB — ETHANOL: Alcohol, Ethyl (B): 233 mg/dL — ABNORMAL HIGH (ref 0–11)

## 2011-08-21 LAB — IRON AND TIBC
Saturation Ratios: 8 % — ABNORMAL LOW (ref 20–55)
UIBC: 345 ug/dL (ref 125–400)

## 2011-08-21 LAB — CBC
HCT: 30.9 % — ABNORMAL LOW (ref 36.0–46.0)
Hemoglobin: 10.8 g/dL — ABNORMAL LOW (ref 12.0–15.0)
MCHC: 35 g/dL (ref 30.0–36.0)

## 2011-08-21 LAB — RETICULOCYTES
RBC.: 3.73 MIL/uL — ABNORMAL LOW (ref 3.87–5.11)
Retic Count, Absolute: 56 K/uL (ref 19.0–186.0)
Retic Ct Pct: 1.5 % (ref 0.4–3.1)

## 2011-08-21 LAB — LIPID PANEL
HDL: 59 mg/dL (ref >39)
LDL Cholesterol: 132 mg/dL — ABNORMAL HIGH (ref 0–99)

## 2011-08-21 MED ORDER — FOLIC ACID 1 MG PO TABS
1.0000 mg | ORAL_TABLET | Freq: Every day | ORAL | Status: DC
  Administered 2011-08-22: 1 mg via ORAL
  Filled 2011-08-21 (×2): qty 1

## 2011-08-21 MED ORDER — LORAZEPAM 2 MG/ML IJ SOLN: 1.0000 mg | Freq: Four times a day (QID) | INTRAMUSCULAR | Status: DC | PRN

## 2011-08-21 MED ORDER — LORAZEPAM 1 MG PO TABS: 0.0000 mg | ORAL_TABLET | Freq: Two times a day (BID) | ORAL | Status: DC

## 2011-08-21 MED ORDER — SENNOSIDES-DOCUSATE SODIUM 8.6-50 MG PO TABS: 1.0000 | ORAL_TABLET | Freq: Every evening | ORAL | Status: DC | PRN

## 2011-08-21 MED ORDER — POTASSIUM CHLORIDE IN NACL 20-0.9 MEQ/L-% IV SOLN
INTRAVENOUS | Status: DC
  Administered 2011-08-21 – 2011-08-22 (×2): via INTRAVENOUS
  Filled 2011-08-21 (×5): qty 1000

## 2011-08-21 MED ORDER — THIAMINE HCL 100 MG/ML IJ SOLN
100.0000 mg | Freq: Every day | INTRAMUSCULAR | Status: DC
  Administered 2011-08-21: 100 mg via INTRAVENOUS
  Filled 2011-08-21 (×2): qty 1

## 2011-08-21 MED ORDER — LORAZEPAM 1 MG PO TABS
0.0000 mg | ORAL_TABLET | Freq: Four times a day (QID) | ORAL | Status: DC
  Administered 2011-08-21 – 2011-08-22 (×2): 1 mg via ORAL
  Filled 2011-08-21 (×2): qty 1

## 2011-08-21 MED ORDER — LISINOPRIL 20 MG PO TABS
20.0000 mg | ORAL_TABLET | Freq: Every day | ORAL | Status: DC
  Administered 2011-08-22: 20 mg via ORAL
  Filled 2011-08-21 (×2): qty 1

## 2011-08-21 MED ORDER — LORAZEPAM 1 MG PO TABS: 1.0000 mg | ORAL_TABLET | Freq: Four times a day (QID) | ORAL | Status: DC | PRN

## 2011-08-21 MED ORDER — POTASSIUM CHLORIDE CRYS ER 20 MEQ PO TBCR
30.0000 meq | EXTENDED_RELEASE_TABLET | Freq: Three times a day (TID) | ORAL | Status: DC
  Administered 2011-08-21 – 2011-08-22 (×3): 30 meq via ORAL
  Filled 2011-08-21 (×5): qty 1

## 2011-08-21 MED ORDER — FERROUS SULFATE 325 (65 FE) MG PO TABS
325.0000 mg | ORAL_TABLET | Freq: Every day | ORAL | Status: DC
  Administered 2011-08-22: 325 mg via ORAL
  Filled 2011-08-21 (×2): qty 1

## 2011-08-21 MED ORDER — CYANOCOBALAMIN 1000 MCG/ML IJ SOLN
1000.0000 ug | Freq: Once | INTRAMUSCULAR | Status: DC
  Filled 2011-08-21: qty 1

## 2011-08-21 MED ORDER — SODIUM CHLORIDE 0.9 % IV SOLN
INTRAVENOUS | Status: DC
  Administered 2011-08-21: 02:00:00 via INTRAVENOUS

## 2011-08-21 MED ORDER — ADULT MULTIVITAMIN W/MINERALS CH
1.0000 | ORAL_TABLET | Freq: Every day | ORAL | Status: DC
  Administered 2011-08-22: 1 via ORAL
  Filled 2011-08-21 (×2): qty 1

## 2011-08-21 MED ORDER — AMLODIPINE BESYLATE 5 MG PO TABS
5.0000 mg | ORAL_TABLET | Freq: Every day | ORAL | Status: DC
  Administered 2011-08-22: 5 mg via ORAL
  Filled 2011-08-21 (×2): qty 1

## 2011-08-21 MED ORDER — VITAMIN B-1 100 MG PO TABS
100.0000 mg | ORAL_TABLET | Freq: Every day | ORAL | Status: DC
  Administered 2011-08-22: 100 mg via ORAL
  Filled 2011-08-21 (×2): qty 1

## 2011-08-21 MED ORDER — BIOTENE DRY MOUTH MT LIQD
15.0000 mL | Freq: Two times a day (BID) | OROMUCOSAL | Status: DC
  Administered 2011-08-21: 15 mL via OROMUCOSAL

## 2011-08-21 MED ORDER — GADOBENATE DIMEGLUMINE 529 MG/ML IV SOLN
20.0000 mL | Freq: Once | INTRAVENOUS | Status: AC
  Administered 2011-08-21: 20 mL via INTRAVENOUS

## 2011-08-21 MED ORDER — ENSURE COMPLETE PO LIQD: 237.0000 mL | Freq: Every day | ORAL | Status: DC

## 2011-08-21 NOTE — Progress Notes (Signed)
Reviewed chart, labs and examined patient.   Please see new orders.  Maryln Manuel, MD

## 2011-08-21 NOTE — Progress Notes (Signed)
Occupational Therapy Discharge Patient Details Name: Sharon Hunt MRN: [id number] DOB: 07-24-1970 Today's Date: 08/21/2011 Time: 4098-1191 ([id number]) OT Time Calculation (min): 26 min  Patient discharged from OT services secondary to goals met and no further OT needs identified.  Please see latest therapy progress note for current level of functioning and progress toward goals.    Progress and discharge plan discussed with patient and/or caregiver: Patient/Caregiver agrees with plan  Lucile Shutters Pager: [phone]  08/21/2011, 3:46 PM

## 2011-08-21 NOTE — Evaluation (Signed)
Occupational Therapy Evaluation Patient Details Name: Sharon Hunt MRN: [id number] DOB: [birth year] Today's Date: 08/21/2011 Time: 4098-1191 ([id number]) OT Time Calculation (min): 26 min  OT Assessment / Plan / Recommendation Clinical Impression  41 yo female admitted for LOC (+) and pending work up to R/o CVA.     OT Assessment  Patient does not need any further OT services    Follow Up Recommendations  No OT follow up    Barriers to Discharge      Equipment Recommendations  None recommended by OT    Recommendations for Other Services    Frequency       Precautions / Restrictions Precautions Precautions: None   Pertinent Vitals/Pain Monitored and stable    ADL  Lower Body Dressing: Performed;Modified independent Where Assessed - Lower Body Dressing: Unsupported sitting Toilet Transfer: Simulated;Modified independent Toilet Transfer Method: Sit to Barista: Regular height toilet Toileting - Clothing Manipulation and Hygiene: Performed;Modified independent Where Assessed - Toileting Clothing Manipulation and Hygiene: Sit on 3-in-1 or toilet ADL Comments: PT reports feeling back to baseline and with no deficits. PT closing one eye to read the clock. When asked "do you see double?" pt said if I tell you then you arent going to feed me. Pt educated on the need for SLP to eval and that double vision will not affect the result of that seperate testing. Pt says I dont have my glasses so i can see better with one eye closed. Pt denies double vision.     OT Goals    Visit Information  Last OT Received On: 08/21/11 Assistance Needed: +1    Subjective Data  Subjective: "when can I eat? I am hungry   Prior Functioning  Home Living Lives With: Son Available Help at Discharge: Available PRN/intermittently (can divide supervision between two sons as needed ) Type of Home: House Home Access: Stairs to enter [employer] of Steps: 1 ( on  porch) Home Layout: Two level Bathroom Shower/Tub: Tub/shower unit;Curtain Firefighter: Handicapped height Bathroom Accessibility: Yes How Accessible: Accessible via walker Home Adaptive Equipment: None Prior Function Level of Independence: Independent Able to Take Stairs?: Yes Driving: Yes Vocation: Student Communication Communication: No difficulties Dominant Hand: Right    Cognition  Overall Cognitive Status: Appears within functional limits for tasks assessed/performed Arousal/Alertness: Awake/alert Orientation Level: Appears intact for tasks assessed Behavior During Session: [facility] for tasks performed    Extremity/Trunk Assessment Right Upper Extremity Assessment RUE ROM/Strength/Tone: Within functional levels RUE Sensation: WFL - Light Touch RUE Coordination: WFL - gross/fine motor Left Upper Extremity Assessment LUE ROM/Strength/Tone: Within functional levels LUE Sensation: WFL - Light Touch LUE Coordination: WFL - gross/fine motor Trunk Assessment Trunk Assessment: Normal   Mobility Bed Mobility Bed Mobility: Supine to Sit Supine to Sit: 7: Independent;HOB flat Transfers Transfers: Sit to Stand;Stand to Sit Sit to Stand: 6: Modified independent (Device/Increase time);From bed Stand to Sit: 6: Modified independent (Device/Increase time);To chair/3-in-1   Exercise    Balance    End of Session OT - End of Session Activity Tolerance: Patient tolerated treatment well Patient left: in chair (transporting to vascular lab) Nurse Communication: Mobility status  Pt educated on signs and symptoms of stroke. Pt educated on risk and prevention. Pt plans to drink ETOH and pt encouraged to speak to MD about safe levels of ETOH and that no consumption would be the best option.    Harrel Carina [facility] 08/21/2011, 3:44 PM Pager: [phone]

## 2011-08-21 NOTE — Evaluation (Signed)
Physical Therapy Evaluation Patient Details Name: Sharon Hunt MRN: [id number] DOB: 1970/04/11 Today's Date: 08/21/2011 Time: 0865-7846 PT Time Calculation (min): 17 min  PT Assessment / Plan / Recommendation Clinical Impression  Pt presents with a medical diagnosis of Questionable tiny acute non hemorrhagic infarct posterior capsule. Pt with decreased functional mobility and balance presenting as a fall risk as well as decreased safety awareness. Pt will benefit from skilled PT in the acute care setting in order to maximize functional mobility and safety prior to d/c home    PT Assessment  Patient needs continued PT services    Follow Up Recommendations  Home health PT (pending progress)       lEquipment Recommendations  Other (comment) (TBD)       Frequency Min 4X/week    Precautions / Restrictions Precautions Precautions: None Restrictions Weight Bearing Restrictions: No         Mobility  Bed Mobility Bed Mobility: Supine to Sit Supine to Sit: 7: Independent;HOB flat Sit to Supine: 7: Independent Transfers Transfers: Sit to Stand;Stand to Sit Sit to Stand: 4: Min guard;With upper extremity assist;From bed Stand to Sit: 4: Min guard;With upper extremity assist;To bed Details for Transfer Assistance: Minguard for stability upon standing Ambulation/Gait Ambulation/Gait Assistance: 4: Min assist Ambulation Distance (Feet): 150 Feet Assistive device: None Ambulation/Gait Assistance Details: Min assist for stability as pt with scissoring gait and balance difficulties Gait Pattern: Scissoring;Narrow base of support Stairs: Yes Stairs Assistance: 5: Supervision Stairs Assistance Details (indicate cue type and reason): VC for sequencing and safe technique Stair Management Technique: Two rails;Forwards Number of Stairs: 5  Modified Rankin (Stroke Patients Only) Pre-Morbid Rankin Score: No symptoms Modified Rankin: Moderately severe disability    Exercises     PT  Diagnosis: Difficulty walking  PT Problem List: Decreased activity tolerance;Decreased balance;Decreased mobility;Decreased knowledge of use of DME;Decreased safety awareness;Decreased knowledge of precautions PT Treatment Interventions: DME instruction;Gait training;Stair training;Therapeutic activities;Functional mobility training;Therapeutic exercise;Balance training;Neuromuscular re-education;Patient/family education   PT Goals Acute Rehab PT Goals PT Goal Formulation: With patient Time For Goal Achievement: 08/28/11 Potential to Achieve Goals: Fair Pt will go Sit to Stand: with modified independence PT Goal: Sit to Stand - Progress: Goal set today Pt will go Stand to Sit: with modified independence PT Goal: Stand to Sit - Progress: Goal set today Pt will Transfer Bed to Chair/Chair to Bed: with modified independence PT Transfer Goal: Bed to Chair/Chair to Bed - Progress: Goal set today Pt will Ambulate: >150 feet;with modified independence;with least restrictive assistive device PT Goal: Ambulate - Progress: Goal set today Pt will Go Up / Down Stairs: Flight;with supervision;with rail(s) PT Goal: Up/Down Stairs - Progress: Goal set today Additional Goals Additional Goal #1: Pt will score > 19 on the DGI to indicate a decreased fall risk in the community PT Goal: Additional Goal #1 - Progress: Goal set today  Visit Information  Last PT Received On: 08/21/11 Assistance Needed: +1    Subjective Data      Prior Functioning  Home Living Lives With: Son Available Help at Discharge: Available PRN/intermittently Type of Home: House Home Access: Stairs to enter Secretary/administrator of Steps: 1 Entrance Stairs-Rails: Right;None Home Layout: Two level Alternate Level Stairs-Number of Steps: 13 Alternate Level Stairs-Rails: Can reach both Bathroom Shower/Tub: Tub/shower unit;Curtain Bathroom Toilet: Handicapped height Bathroom Accessibility: Yes How Accessible: Accessible via  walker Home Adaptive Equipment: None Prior Function Level of Independence: Independent Able to Take Stairs?: Yes Driving: Yes Vocation: Student Communication Communication:  No difficulties Dominant Hand: Right    Cognition  Overall Cognitive Status: Appears within functional limits for tasks assessed/performed Arousal/Alertness: Awake/alert Orientation Level: Appears intact for tasks assessed Behavior During Session: [facility] for tasks performed    Extremity/Trunk Assessment Right Upper Extremity Assessment RUE ROM/Strength/Tone: Within functional levels RUE Sensation: WFL - Light Touch RUE Coordination: WFL - gross/fine motor Left Upper Extremity Assessment LUE ROM/Strength/Tone: Within functional levels LUE Sensation: WFL - Light Touch LUE Coordination: WFL - gross/fine motor Right Lower Extremity Assessment RLE ROM/Strength/Tone: Within functional levels RLE Sensation: WFL - Light Touch Left Lower Extremity Assessment LLE ROM/Strength/Tone: Within functional levels LLE Sensation: WFL - Light Touch Trunk Assessment Trunk Assessment: Normal   Balance Balance Balance Assessed: Yes (see ggait) Standardized Balance Assessment Standardized Balance Assessment: Dynamic Gait Index  End of Session PT - End of Session Equipment Utilized During Treatment: Gait belt Activity Tolerance: Patient tolerated treatment well Patient left: in bed;with call bell/phone within reach Nurse Communication: Mobility status   Milana Kidney 08/21/2011, 5:22 PM  08/21/2011 Milana Kidney DPT PAGER: [phone] OFFICE: [phone]

## 2011-08-21 NOTE — Care Management Note (Signed)
    Page 1 of 1   08/25/2011     10:29:10 AM   CARE MANAGEMENT NOTE 08/25/2011  Patient:  Sharon Hunt, Sharon Hunt   Account Number:  [id number]  Date Initiated:  08/21/2011  Documentation initiated by:  Onnie Boer  Subjective/Objective Assessment:   PT WAS ADMITTED WITH LEFT SIDED WEAKNESS AFTER BEING FOUND UNCONSIOUS AT HOME IN HER YARD     Action/Plan:   PROGRESSION OF CARE AND DISCHARE PLANNING   Anticipated DC Date:  08/24/2011   Anticipated DC Plan:  HOME W HOME HEALTH SERVICES  In-house referral  Clinical Social Worker      DC Planning Services  CM consult      Choice offered to / List presented to:             Status of service:  Completed, signed off Medicare Important Message given?   (If response is "NO", the following Medicare IM given date fields will be blank) Date Medicare IM given:   Date Additional Medicare IM given:    Discharge Disposition:  HOME/SELF CARE  Per UR Regulation:  Reviewed for med. necessity/level of care/duration of stay  If discussed at Long Length of Stay Meetings, dates discussed:    Comments:  08/22/11 Onnie Boer, RN, BSN 1028 PT WAS DC'D TO HOME WITH SELF CARE  08/21/11 Onnie Boer, RN, BSN 1454 PT WAS AT HOME WITH SELF CARE PTA.  WILL F/U ON DC NEEDS

## 2011-08-21 NOTE — Progress Notes (Signed)
  Echocardiogram 2D Echocardiogram has been performed.  Crystal Scarberry L 08/21/2011, 11:51 AM

## 2011-08-21 NOTE — Progress Notes (Signed)
Subjective: Patient awake and alert.  Ready to go home.  Still feels that her left side is weaker but also has pain on the left as well and it is unclear if this is more related to the fall than an intracranial process.    Objective: Current vital signs: BP 118/76  Pulse 89  Temp(Src) 98.6 F (37 C) (Oral)  Resp 16  Ht 5\' 5"  (1.651 m)  Wt 75.751 kg (167 lb)  BMI 27.79 kg/m2  SpO2 97% Vital signs in last 24 hours: Temp:  [97.8 F (36.6 C)-98.6 F (37 C)] 98.6 F (37 C) (06/06 0413) Pulse Rate:  [84-89] 89  (06/06 0413) Resp:  [16-34] 16  (06/06 0413) BP: (113-161)/(72-105) 118/76 mmHg (06/06 0413) SpO2:  [96 %-100 %] 97 % (06/06 0413) Weight:  [75.751 kg (167 lb)] 75.751 kg (167 lb) (06/06 0500)  Intake/Output from previous day:   Intake/Output this shift:   Nutritional status: NPO  Neurologic Exam: Mental Status: Alert, oriented, thought content appropriate.  Speech fluent without evidence of aphasia.  Able to follow 3 step commands without difficulty. Cranial Nerves: II: visual fields grossly normal, pupils equal, round, reactive to light and accommodation III,IV, VI: ptosis not present, extra-ocular motions intact bilaterally V,VII: smile symmetric, facial light touch sensation normal bilaterally VIII: hearing normal bilaterally IX,X: gag reflex present XI: trapezius strength/neck flexion strength normal bilaterally XII: tongue strength normal  Motor: Right : Upper extremity   5/5    Left:  Upper extremity Decreased movement secondary to pain but able to lift off bed  Lower extremity   5/5             Lower extremity Decreased movement secondary to pain but able to lift off bed  Tone and bulk:normal tone throughout; no atrophy noted Sensory: Pinprick and light touch intact throughout, bilaterally Deep Tendon Reflexes: 1+ and symmetric throughout Plantars: Right: downgoing   Left: downgoing Cerebellar: normal finger-to-nose and normal heel-to-shin test   Lab  Results: Results for orders placed during the hospital encounter of 08/20/11 (from the past 48 hour(s))  PROTIME-INR     Status: Normal   Collection Time   08/20/11 10:22 PM      Component Value Range Comment   Prothrombin Time 13.6  11.6 - 15.2 (seconds)    INR 1.02  0.00 - 1.49    APTT     Status: Normal   Collection Time   08/20/11 10:22 PM      Component Value Range Comment   aPTT 27  24 - 37 (seconds)   CBC     Status: Abnormal   Collection Time   08/20/11 10:22 PM      Component Value Range Comment   WBC 9.0  4.0 - 10.5 (K/uL)    RBC 3.99  3.87 - 5.11 (MIL/uL)    Hemoglobin 11.5 (*) 12.0 - 15.0 (g/dL)    HCT 40.9 (*) 81.1 - 46.0 (%)    MCV 82.7  78.0 - 100.0 (fL)    MCH 28.8  26.0 - 34.0 (pg)    MCHC 34.8  30.0 - 36.0 (g/dL)    RDW 91.4 (*) 78.2 - 15.5 (%)    Platelets 203  150 - 400 (K/uL)   DIFFERENTIAL     Status: Abnormal   Collection Time   08/20/11 10:22 PM      Component Value Range Comment   Neutrophils Relative 41 (*) 43 - 77 (%)    Lymphocytes Relative 49 (*) 12 -  46 (%)    Monocytes Relative 7  3 - 12 (%)    Eosinophils Relative 2  0 - 5 (%)    Basophils Relative 1  0 - 1 (%)    Neutro Abs 3.7  1.7 - 7.7 (K/uL)    Lymphs Abs 4.4 (*) 0.7 - 4.0 (K/uL)    Monocytes Absolute 0.6  0.1 - 1.0 (K/uL)    Eosinophils Absolute 0.2  0.0 - 0.7 (K/uL)    Basophils Absolute 0.1  0.0 - 0.1 (K/uL)    RBC Morphology TEARDROP CELLS      WBC Morphology        Value: MODERATE LEFT SHIFT (>5% METAS AND MYELOS,OCC PRO NOTED)  COMPREHENSIVE METABOLIC PANEL     Status: Abnormal   Collection Time   08/20/11 10:22 PM      Component Value Range Comment   Sodium 140  135 - 145 (mEq/L)    Potassium 3.1 (*) 3.5 - 5.1 (mEq/L)    Chloride 105  96 - 112 (mEq/L)    CO2 22  19 - 32 (mEq/L)    Glucose, Bld 99  70 - 99 (mg/dL)    BUN 7  6 - 23 (mg/dL)    Creatinine, Ser 1.61  0.50 - 1.10 (mg/dL)    Calcium 8.9  8.4 - 10.5 (mg/dL)    Total Protein 6.8  6.0 - 8.3 (g/dL)    Albumin 3.8  3.5 -  5.2 (g/dL)    AST 37  0 - 37 (U/L)    ALT 16  0 - 35 (U/L)    Alkaline Phosphatase 72  39 - 117 (U/L)    Total Bilirubin 0.3  0.3 - 1.2 (mg/dL)    GFR calc non Af Amer 82 (*) >90 (mL/min)    GFR calc Af Amer >90  >90 (mL/min)   CK TOTAL AND CKMB     Status: Abnormal   Collection Time   08/20/11 10:22 PM      Component Value Range Comment   Total CK 318 (*) 7 - 177 (U/L)    CK, MB 2.4  0.3 - 4.0 (ng/mL)    Relative Index 0.8  0.0 - 2.5    TROPONIN I     Status: Normal   Collection Time   08/20/11 10:22 PM      Component Value Range Comment   Troponin I <0.30  <0.30 (ng/mL)   URINE RAPID DRUG SCREEN (HOSP PERFORMED)     Status: Normal   Collection Time   08/20/11 10:46 PM      Component Value Range Comment   Opiates NONE DETECTED  NONE DETECTED     Cocaine NONE DETECTED  NONE DETECTED     Benzodiazepines NONE DETECTED  NONE DETECTED     Amphetamines NONE DETECTED  NONE DETECTED     Tetrahydrocannabinol NONE DETECTED  NONE DETECTED     Barbiturates NONE DETECTED  NONE DETECTED    GLUCOSE, CAPILLARY     Status: Abnormal   Collection Time   08/20/11 10:46 PM      Component Value Range Comment   Glucose-Capillary 107 (*) 70 - 99 (mg/dL)    Comment 1 Documented in Chart      Comment 2 Notify RN     ETHANOL     Status: Abnormal   Collection Time   08/20/11 11:02 PM      Component Value Range Comment   Alcohol, Ethyl (B) 233 (*) 0 - 11 (mg/dL)  COMPREHENSIVE METABOLIC PANEL     Status: Abnormal   Collection Time   08/21/11  6:13 AM      Component Value Range Comment   Sodium 144  135 - 145 (mEq/L)    Potassium 3.1 (*) 3.5 - 5.1 (mEq/L)    Chloride 108  96 - 112 (mEq/L)    CO2 21  19 - 32 (mEq/L)    Glucose, Bld 82  70 - 99 (mg/dL)    BUN 7  6 - 23 (mg/dL)    Creatinine, Ser 2.84  0.50 - 1.10 (mg/dL)    Calcium 8.3 (*) 8.4 - 10.5 (mg/dL)    Total Protein 6.0  6.0 - 8.3 (g/dL)    Albumin 3.3 (*) 3.5 - 5.2 (g/dL)    AST 29  0 - 37 (U/L)    ALT 13  0 - 35 (U/L)    Alkaline  Phosphatase 61  39 - 117 (U/L)    Total Bilirubin 0.3  0.3 - 1.2 (mg/dL)    GFR calc non Af Amer 81 (*) >90 (mL/min)    GFR calc Af Amer >90  >90 (mL/min)   CBC     Status: Abnormal   Collection Time   08/21/11  6:13 AM      Component Value Range Comment   WBC 6.5  4.0 - 10.5 (K/uL)    RBC 3.73 (*) 3.87 - 5.11 (MIL/uL)    Hemoglobin 10.8 (*) 12.0 - 15.0 (g/dL)    HCT 13.2 (*) 44.0 - 46.0 (%)    MCV 82.8  78.0 - 100.0 (fL)    MCH 29.0  26.0 - 34.0 (pg)    MCHC 35.0  30.0 - 36.0 (g/dL)    RDW 10.2 (*) 72.5 - 15.5 (%)    Platelets 206  150 - 400 (K/uL)   RETICULOCYTES     Status: Abnormal   Collection Time   08/21/11  6:13 AM      Component Value Range Comment   Retic Ct Pct 1.5  0.4 - 3.1 (%)    RBC. 3.73 (*) 3.87 - 5.11 (MIL/uL)    Retic Count, Manual 56.0  19.0 - 186.0 (K/uL)   LIPID PANEL     Status: Abnormal   Collection Time   08/21/11  6:13 AM      Component Value Range Comment   Cholesterol 212 (*) 0 - 200 (mg/dL)    Triglycerides 366  <150 (mg/dL)    HDL 59  >44 (mg/dL)    Total CHOL/HDL Ratio 3.6      VLDL 21  0 - 40 (mg/dL)    LDL Cholesterol 034 (*) 0 - 99 (mg/dL)   URINE RAPID DRUG SCREEN (HOSP PERFORMED)     Status: Normal   Collection Time   08/21/11  6:50 AM      Component Value Range Comment   Opiates NONE DETECTED  NONE DETECTED     Cocaine NONE DETECTED  NONE DETECTED     Benzodiazepines NONE DETECTED  NONE DETECTED     Amphetamines NONE DETECTED  NONE DETECTED     Tetrahydrocannabinol NONE DETECTED  NONE DETECTED     Barbiturates NONE DETECTED  NONE DETECTED      No results found for this or any previous visit (from the past 240 hour(s)).  Lipid Panel  Basename 08/21/11 0613  CHOL 212*  TRIG 105  HDL 59  CHOLHDL 3.6  VLDL 21  LDLCALC 742*    Studies/Results: Ct Head Wo  Contrast  08/20/2011  *RADIOLOGY REPORT*  Clinical Data: Left-sided weakness.  Code stroke.  CT HEAD WITHOUT CONTRAST  Technique:  Contiguous axial images were obtained from the base  of the skull through the vertex without contrast.  Comparison: Brain MRI 10/18/2010.  Findings: No acute intracranial abnormalities.  Specifically, no definite evidence of acute/subacute cerebral ischemia, no acute intracranial hemorrhage, no focal mass, mass effect, hydrocephalus or abnormal intra or extra-axial fluid collections.  No acute displaced skull fractures are identified.  Visualized paranasal sinuses and mastoids are well pneumatized.  IMPRESSION: 1.  No acute intracranial abnormalities. 2.  The appearance the brain is normal.  These results were called by telephone on 08/20/2011  at  10:32 p.m. to  Dr. Manus Gunning, who verbally acknowledged these results.  Original Report Authenticated By: Florencia Reasons, M.D.    Medications:  I have reviewed the patient's current medications. Scheduled:   . amLODipine  5 mg Oral Daily  . antiseptic oral rinse  15 mL Mouth Rinse q12n4p  . folic acid  1 mg Oral Daily  . lisinopril  20 mg Oral Daily  . LORazepam  0-4 mg Oral Q6H   Followed by  . LORazepam  0-4 mg Oral Q12H  . multivitamin with minerals  1 tablet Oral Daily  . ondansetron      . ondansetron (ZOFRAN) IV  4 mg Intravenous Once  . potassium chloride  30 mEq Oral TID  . thiamine  100 mg Oral Daily   Or  . thiamine  100 mg Intravenous Daily  . DISCONTD: sodium chloride   Intravenous STAT  . DISCONTD: folic acid  1 mg Oral Daily  . DISCONTD: LORazepam  0-4 mg Intravenous Q6H  . DISCONTD: LORazepam  0-4 mg Intravenous Q12H  . DISCONTD: multivitamin with minerals  1 tablet Oral Daily  . DISCONTD: thiamine  100 mg Intravenous Daily  . DISCONTD: thiamine  100 mg Oral Daily    Assessment/Plan:  Patient Active Hospital Problem List: Left-sided weakness (08/21/2011)   Assessment: Exam essentially unchanged.  MRI and EEG pending. Patient no longer tremulous and vital are stable.     Plan: Will f/u results of MRI and EEG.    LOS: 1 day   Thana Farr, MD [facility] [phone] 08/21/2011  11:17 AM

## 2011-08-21 NOTE — Progress Notes (Signed)
INITIAL ADULT NUTRITION ASSESSMENT Date: 08/21/2011   Time: 3:24 PM Reason for Assessment: Nutrition Risk- unintended weight loss  ASSESSMENT: Female 41 y.o.  Dx: Left-sided weakness, alcohol abuse, anemia  Past Medical History  Diagnosis Date  . Hypertension   . H/O hiatal hernia     Scheduled Meds:    . amLODipine  5 mg Oral Daily  . antiseptic oral rinse  15 mL Mouth Rinse q12n4p  . folic acid  1 mg Oral Daily  . gadobenate dimeglumine  20 mL Intravenous Once  . lisinopril  20 mg Oral Daily  . LORazepam  0-4 mg Oral Q6H   Followed by  . LORazepam  0-4 mg Oral Q12H  . multivitamin with minerals  1 tablet Oral Daily  . ondansetron      . ondansetron (ZOFRAN) IV  4 mg Intravenous Once  . potassium chloride  30 mEq Oral TID  . thiamine  100 mg Oral Daily   Or  . thiamine  100 mg Intravenous Daily  . DISCONTD: sodium chloride   Intravenous STAT  . DISCONTD: folic acid  1 mg Oral Daily  . DISCONTD: LORazepam  0-4 mg Intravenous Q6H  . DISCONTD: LORazepam  0-4 mg Intravenous Q12H  . DISCONTD: multivitamin with minerals  1 tablet Oral Daily  . DISCONTD: thiamine  100 mg Intravenous Daily  . DISCONTD: thiamine  100 mg Oral Daily   Continuous Infusions:    . 0.9 % NaCl with KCl 20 mEq / L 100 mL/hr at 08/21/11 1051  . DISCONTD: sodium chloride 100 mL/hr at 08/21/11 0200   PRN Meds:.LORazepam, LORazepam, senna-docusate, DISCONTD: LORazepam, DISCONTD: LORazepam, DISCONTD: ondansetron (ZOFRAN) IV   Ht: 5\' 5"  (165.1 cm)  Wt: 167 lb (75.751 kg)  Ideal Wt: 56.8 kg  % Ideal Wt: 133%  Usual Wt: around 190-200 lb   Body mass index is 27.79 kg/(m^2).  Food/Nutrition Related Hx:  Pt reports frequent vomiting due to GERD and hiatal hernia. Pt stated that used to weight 216 lb and has progressively lost, and regained some of the weight back. Pt reports avoiding foods that are acidic like tomato or vinegar based sauces and peppers. Pt willing to try supplement  beverage.  Labs:  CMP     Component Value Date/Time   NA 144 08/21/2011 0613   K 3.1* 08/21/2011 0613   CL 108 08/21/2011 0613   CO2 21 08/21/2011 0613   GLUCOSE 82 08/21/2011 0613   BUN 7 08/21/2011 0613   CREATININE 0.87 08/21/2011 0613   CALCIUM 8.3* 08/21/2011 0613   PROT 6.0 08/21/2011 0613   ALBUMIN 3.3* 08/21/2011 0613   AST 29 08/21/2011 0613   ALT 13 08/21/2011 0613   ALKPHOS 61 08/21/2011 0613   BILITOT 0.3 08/21/2011 0613   GFRNONAA 81* 08/21/2011 0613   GFRAA >90 08/21/2011 0613    CBG (last 3)   Basename 08/20/11 2246  GLUCAP 107*     Intake/Output Summary (Last 24 hours) at 08/21/11 1524 Last data filed at 08/21/11 1100  Gross per 24 hour  Intake    900 ml  Output      0 ml  Net    900 ml     Diet Order: General; no PO documented at this time because pt previously NPO  Supplements/Tube Feeding: None at this time.  IVF:     0.9 % NaCl with KCl 20 mEq / L Last Rate: 100 mL/hr at 08/21/11 1051  DISCONTD: sodium chloride Last Rate: 100  mL/hr at 08/21/11 0200    Estimated Nutritional Needs:   Kcal: 2000-2150 Protein: 70-80 gm Fluid: >2.2 L  NUTRITION DIAGNOSIS: -Altered GI function (NI-1.4).  Status: Ongoing  RELATED TO: hiatal hernia  AS EVIDENCE BY: frequent vomiting and approx. 50 lb weight loss  MONITORING/EVALUATION(Goals): Goals: Pt to maintain current weight within 5%. Monitor: weight, PO intake, labs  EDUCATION NEEDS: -Education needs addressed  INTERVENTION: Discussed with pt dietary management of GERD. Will order pt Ensure Complete daily.   DOCUMENTATION CODES Per approved criteria  -Non-severe (moderate) malnutrition in the context of chronic illness    Leonette Most 08/21/2011, 3:24 PM

## 2011-08-21 NOTE — Progress Notes (Signed)
Physical Therapy Cancellation Note: order received pt currently OOR for procedure/test and unable to complete eval. Will attempt again as time allows. Thanks Delaney Meigs, PT [phone]

## 2011-08-21 NOTE — Progress Notes (Signed)
*  PRELIMINARY RESULTS* Vascular Ultrasound Carotid Duplex (Doppler) has been completed.  No evidence of internal carotid artery stenosis bilaterally. Bilateral antegrade vertebral artery flow.  Malachy Moan, RDMS, RDCS 08/21/2011, 12:02 PM

## 2011-08-21 NOTE — Evaluation (Signed)
Clinical/Bedside Swallow Evaluation Patient Details  Name: Sharon Hunt MRN: [id number] Date of Birth: April 26, 1970  Today's Date: 08/21/2011 Time: 8657-8469 SLP Time Calculation (min): 26 min  Past Medical History:  Past Medical History  Diagnosis Date  . Hypertension   . H/O hiatal hernia    Past Surgical History:  Past Surgical History  Procedure Date  . Tonsillectomy   . Tubal ligation   . Knee surgery    HPI:  Sharon Hunt is an 41 y.o. female history of hypertension presenting following an episode of unresponsiveness followed by confusion as well as reduce speech output and reduced movement of left extremities. Patient was brought to the emergency room and code stroke status. Was noted to have reduce speech output but no dysarthria. She had good strength of left extremities and no facial asymmetry. There was reduced movement of left extremities voluntarily, compared to right extremities. CT scan of her head showed no acute intracranial abnormality. No previous history of loss of consciousness. Patient admits to alcohol consumption daily. Alcohol level is pending, as is urine drug screen. Patient's speech returned to normal in the emergency room after about 20-30 minutes. Speech and movement of her left extremities also returned to normal. MRI does show questionable small infarct in right internal capsule. Pt reports history of vomiting due to hiatal hernia and severe acid reflux, diagnosed by Dr. Ewing Schlein via EGD. Cannot locate these records in chart.    Assessment / Plan / Recommendation Clinical Impression  Pt does not demonstarte any overt s/s ofa spiraiton iwth liquid or solid POs. Pt does admit to long history of esopahgeal dyshpagia with vomiting in am and feeling of food stuck in throat at night. She reports she had an EGD with [facility] who told her she had a hiatal hernia and severe reflux. Pt did complin of sharp pain in chest after eating. SLP recommends pt initate a regular  diet and thin liquids but follow esophageal precuations. SLP educated pt. No f/u needed at this point.     Aspiration Risk  None    Diet Recommendation Regular;Thin liquid   Liquid Administration via: Cup;Straw Medication Administration: Whole meds with liquid Supervision: Patient able to self feed Compensations: Follow solids with liquid Postural Changes and/or Swallow Maneuvers: Upright 30-60 min after meal    Other  Recommendations Recommended Consults: Other (Comment) (MD discuss medical managment of GERD with pt. ) Oral Care Recommendations: Patient independent with oral care   Follow Up Recommendations  None    Frequency and Duration        Pertinent Vitals/Pain NA    SLP Swallow Goals     Swallow Study Prior Functional Status       General HPI: Sharon Hunt is an 41 y.o. female history of hypertension presenting following an episode of unresponsiveness followed by confusion as well as reduce speech output and reduced movement of left extremities. Patient was brought to the emergency room and code stroke status. Was noted to have reduce speech output but no dysarthria. She had good strength of left extremities and no facial asymmetry. There was reduced movement of left extremities voluntarily, compared to right extremities. CT scan of her head showed no acute intracranial abnormality. No previous history of loss of consciousness. Patient admits to alcohol consumption daily. Alcohol level is pending, as is urine drug screen. Patient's speech returned to normal in the emergency room after about 20-30 minutes. Speech and movement of her left extremities also returned to normal. MRI  does show questionable small infarct in right internal capsule. Pt reports history of vomiting due to hiatal hernia and severe acid reflux, diagnosed by Dr. Ewing Schlein via EGD. Cannot locate these records in chart.  Type of Study: Bedside swallow evaluation Diet Prior to this Study: NPO Temperature  Spikes Noted: No Respiratory Status: Room air Behavior/Cognition: Alert;Cooperative Oral Cavity - Dentition: Adequate natural dentition Self-Feeding Abilities: Able to feed self Patient Positioning: Upright in bed Baseline Vocal Quality: Clear Volitional Cough: Strong Volitional Swallow: Able to elicit    Oral/Motor/Sensory Function Overall Oral Motor/Sensory Function: Impaired Labial ROM: Within Functional Limits Labial Symmetry: Within Functional Limits Labial Strength: Within Functional Limits Labial Sensation: Within Functional Limits Lingual ROM: Within Functional Limits Lingual Symmetry: Abnormal symmetry right (questionable. ) Lingual Strength: Within Functional Limits Lingual Sensation: Within Functional Limits Facial ROM: Within Functional Limits Facial Symmetry: Within Functional Limits Facial Strength: Within Functional Limits Facial Sensation: Within Functional Limits Velum: Within Functional Limits Mandible: Within Functional Limits   Ice Chips Ice chips: Not tested   Thin Liquid Thin Liquid: Within functional limits Presentation: Cup;Straw;Self Fed    Nectar Thick Nectar Thick Liquid: Not tested   Honey Thick Honey Thick Liquid: Not tested   Puree Puree: Within functional limits   Solid Solid: Within functional limits    Anndee Connett, Riley Nearing 08/21/2011,3:20 PM

## 2011-08-21 NOTE — H&P (Signed)
Sharon Hunt is an 41 y.o. female.   PCP - [facility]. Chief Complaint: Found unconscious. HPI: 41 year-old female with history of hypertension was found to be unconscious in the yard at home. She was mowing the yard when suddenly she lost consciousness. Prior to that patient had some alcohol. Patient's family noticed that patient also had some left-sided weakness. EMS was called and patient was brought to the ER as code stroke. CT head was negative. Neurologist evaluated the patient and felt patient probably might have had seizure related to alcoholism. Patient has been admitted for further management. Patient denies any headache, or visual symptoms. Patient still has mild weakness on the left side. Denies any chest pain or shortness of breath.  Past Medical History  Diagnosis Date  . Hypertension     Past Surgical History  Procedure Date  . Tonsillectomy   . Tubal ligation   . Knee surgery     History reviewed. No pertinent family history. Social History:  reports that she has been smoking.  She does not have any smokeless tobacco history on file. She reports that she drinks alcohol. Her drug history not on file.  Allergies: No Known Allergies   (Not in a hospital admission)  Results for orders placed during the hospital encounter of 08/20/11 (from the past 48 hour(s))  PROTIME-INR     Status: Normal   Collection Time   08/20/11 10:22 PM      Component Value Range Comment   Prothrombin Time 13.6  11.6 - 15.2 (seconds)    INR 1.02  0.00 - 1.49    APTT     Status: Normal   Collection Time   08/20/11 10:22 PM      Component Value Range Comment   aPTT 27  24 - 37 (seconds)   CBC     Status: Abnormal   Collection Time   08/20/11 10:22 PM      Component Value Range Comment   WBC 9.0  4.0 - 10.5 (K/uL)    RBC 3.99  3.87 - 5.11 (MIL/uL)    Hemoglobin 11.5 (*) 12.0 - 15.0 (g/dL)    HCT 16.1 (*) 09.6 - 46.0 (%)    MCV 82.7  78.0 - 100.0 (fL)    MCH 28.8   26.0 - 34.0 (pg)    MCHC 34.8  30.0 - 36.0 (g/dL)    RDW 04.5 (*) 40.9 - 15.5 (%)    Platelets 203  150 - 400 (K/uL)   DIFFERENTIAL     Status: Abnormal   Collection Time   08/20/11 10:22 PM      Component Value Range Comment   Neutrophils Relative 41 (*) 43 - 77 (%)    Lymphocytes Relative 49 (*) 12 - 46 (%)    Monocytes Relative 7  3 - 12 (%)    Eosinophils Relative 2  0 - 5 (%)    Basophils Relative 1  0 - 1 (%)    Neutro Abs 3.7  1.7 - 7.7 (K/uL)    Lymphs Abs 4.4 (*) 0.7 - 4.0 (K/uL)    Monocytes Absolute 0.6  0.1 - 1.0 (K/uL)    Eosinophils Absolute 0.2  0.0 - 0.7 (K/uL)    Basophils Absolute 0.1  0.0 - 0.1 (K/uL)    RBC Morphology TEARDROP CELLS      WBC Morphology        Value: MODERATE LEFT SHIFT (>5% METAS AND MYELOS,OCC PRO NOTED)  COMPREHENSIVE METABOLIC PANEL     Status: Abnormal   Collection Time   08/20/11 10:22 PM      Component Value Range Comment   Sodium 140  135 - 145 (mEq/L)    Potassium 3.1 (*) 3.5 - 5.1 (mEq/L)    Chloride 105  96 - 112 (mEq/L)    CO2 22  19 - 32 (mEq/L)    Glucose, Bld 99  70 - 99 (mg/dL)    BUN 7  6 - 23 (mg/dL)    Creatinine, Ser 1.61  0.50 - 1.10 (mg/dL)    Calcium 8.9  8.4 - 10.5 (mg/dL)    Total Protein 6.8  6.0 - 8.3 (g/dL)    Albumin 3.8  3.5 - 5.2 (g/dL)    AST 37  0 - 37 (U/L)    ALT 16  0 - 35 (U/L)    Alkaline Phosphatase 72  39 - 117 (U/L)    Total Bilirubin 0.3  0.3 - 1.2 (mg/dL)    GFR calc non Af Amer 82 (*) >90 (mL/min)    GFR calc Af Amer >90  >90 (mL/min)   CK TOTAL AND CKMB     Status: Abnormal   Collection Time   08/20/11 10:22 PM      Component Value Range Comment   Total CK 318 (*) 7 - 177 (U/L)    CK, MB 2.4  0.3 - 4.0 (ng/mL)    Relative Index 0.8  0.0 - 2.5    TROPONIN I     Status: Normal   Collection Time   08/20/11 10:22 PM      Component Value Range Comment   Troponin I <0.30  <0.30 (ng/mL)   URINE RAPID DRUG SCREEN (HOSP PERFORMED)     Status: Normal   Collection Time   08/20/11 10:46 PM       Component Value Range Comment   Opiates NONE DETECTED  NONE DETECTED     Cocaine NONE DETECTED  NONE DETECTED     Benzodiazepines NONE DETECTED  NONE DETECTED     Amphetamines NONE DETECTED  NONE DETECTED     Tetrahydrocannabinol NONE DETECTED  NONE DETECTED     Barbiturates NONE DETECTED  NONE DETECTED    GLUCOSE, CAPILLARY     Status: Abnormal   Collection Time   08/20/11 10:46 PM      Component Value Range Comment   Glucose-Capillary 107 (*) 70 - 99 (mg/dL)    Comment 1 Documented in Chart      Comment 2 Notify RN     ETHANOL     Status: Abnormal   Collection Time   08/20/11 11:02 PM      Component Value Range Comment   Alcohol, Ethyl (B) 233 (*) 0 - 11 (mg/dL)    Ct Head Wo Contrast  08/20/2011  *RADIOLOGY REPORT*  Clinical Data: Left-sided weakness.  Code stroke.  CT HEAD WITHOUT CONTRAST  Technique:  Contiguous axial images were obtained from the base of the skull through the vertex without contrast.  Comparison: Brain MRI 10/18/2010.  Findings: No acute intracranial abnormalities.  Specifically, no definite evidence of acute/subacute cerebral ischemia, no acute intracranial hemorrhage, no focal mass, mass effect, hydrocephalus or abnormal intra or extra-axial fluid collections.  No acute displaced skull fractures are identified.  Visualized paranasal sinuses and mastoids are well pneumatized.  IMPRESSION: 1.  No acute intracranial abnormalities. 2.  The appearance the brain is normal.  These results were called by telephone on  08/20/2011  at  10:32 p.m. to  Dr. Manus Gunning, who verbally acknowledged these results.  Original Report Authenticated By: Florencia Reasons, M.D.    Review of Systems  Constitutional: Negative.   HENT: Negative.   Eyes: Negative.   Respiratory: Negative.   Cardiovascular: Negative.   Gastrointestinal: Negative.   Genitourinary: Negative.   Musculoskeletal: Negative.   Skin: Negative.   Neurological: Positive for focal weakness (left sided weakness.) and loss  of consciousness.  Endo/Heme/Allergies: Negative.   Psychiatric/Behavioral: Negative.     Blood pressure 131/84, pulse 88, temperature 97.8 F (36.6 C), temperature source Oral, resp. rate 16, SpO2 100.00%. Physical Exam  Constitutional: She is oriented to person, place, and time. She appears well-developed and well-nourished. No distress.  HENT:  Head: Normocephalic and atraumatic.  Right Ear: External ear normal.  Left Ear: External ear normal.  Nose: Nose normal.  Mouth/Throat: Oropharynx is clear and moist. No oropharyngeal exudate.  Eyes: Conjunctivae are normal. Pupils are equal, round, and reactive to light. Right eye exhibits no discharge. Left eye exhibits no discharge. No scleral icterus.  Neck: Normal range of motion. Neck supple.  Cardiovascular: Normal rate and regular rhythm.   Respiratory: Effort normal and breath sounds normal. No respiratory distress. She has no wheezes. She has no rales.  GI: Soft. Bowel sounds are normal. She exhibits no distension. There is no tenderness. There is no rebound.  Musculoskeletal: Normal range of motion. She exhibits no edema and no tenderness.  Neurological: She is alert and oriented to person, place, and time.       Left upper extremity 4/4. Left lower extremity 4/5. Right upper and lower 5/5. No facial asymmetry. Tongue is midline.   Skin: Skin is warm and dry. She is not diaphoretic.  Psychiatric: Her behavior is normal.     Assessment/Plan #1. Left-sided weakness after unresponsive episode concerning for seizures - neurologist has already evaluated the patient. Patient will be placed on neurochecks and seizure precautions. MRI of the brain with and without contrast and EEG has been ordered. #2. Alcohol abuse - patient is placed on alcohol withdrawal protocol. #3. Hypertension - continue home medications except for HCTZ as we are hydrating the patient. #4. Tobacco abuse - strongly advised to quit smoking. #5. Anemia - check  anemia panel.  CODE STATUS - full code.  Santina Trillo N. 08/21/2011, 12:49 AM

## 2011-08-21 NOTE — Progress Notes (Signed)
Routine EEG completed.  

## 2011-08-21 NOTE — Procedures (Signed)
REFERRING PHYSICIAN:  Eduard Clos, MD  HISTORY:  A 41 year old female with episode of syncope.  MEDICATIONS:  Norvasc, Folvite, Ativan, vitamins, potassium, lisinopril.  CONDITIONS OF RECORDING:  This is a 16-channel EEG carried out with the patient in the awake state.  DESCRIPTION:  The waking background activity consists of a low-voltage, symmetrical, fairly well-organized 10 Hz alpha activity seen from the parieto-occipital and posterotemporal regions.  Low-voltage, fast activity, poorly organized was seen and during at times, superimposed on more posterior rhythms.  A mixture of theta and alpha rhythm was seen from these central and temporal regions.  The patient does not drowse or sleep.  Hyperventilation was not performed.  Intermittent photic stimulation failed to elicit any change in the tracing.  IMPRESSION:  This is a normal awake EEG.  COMMENT:  An EEG with the patient sleep deprived to elicit drowse and light sleep maybe desirable to further elicit a possible seizure disorder.          ______________________________ Thana Farr, MD    [id number] D:  08/21/2011 18:55:34  T:  08/21/2011 19:58:17  Job #:  [id number]

## 2011-08-22 DIAGNOSIS — I10 Essential (primary) hypertension: Secondary | ICD-10-CM

## 2011-08-22 DIAGNOSIS — F17200 Nicotine dependence, unspecified, uncomplicated: Secondary | ICD-10-CM

## 2011-08-22 DIAGNOSIS — G8190 Hemiplegia, unspecified affecting unspecified side: Secondary | ICD-10-CM

## 2011-08-22 DIAGNOSIS — E785 Hyperlipidemia, unspecified: Secondary | ICD-10-CM | POA: Diagnosis present

## 2011-08-22 DIAGNOSIS — F1010 Alcohol abuse, uncomplicated: Secondary | ICD-10-CM

## 2011-08-22 LAB — COMPREHENSIVE METABOLIC PANEL
ALT: 12 U/L (ref 0–35)
AST: 27 U/L (ref 0–37)
Alkaline Phosphatase: 64 U/L (ref 39–117)
CO2: 23 mEq/L (ref 19–32)
Calcium: 8.7 mg/dL (ref 8.4–10.5)
GFR calc non Af Amer: >90 mL/min (ref >90)
Potassium: 3.9 mEq/L (ref 3.5–5.1)

## 2011-08-22 LAB — CBC
HCT: 32.7 % — ABNORMAL LOW (ref 36.0–46.0)
Hemoglobin: 11.4 g/dL — ABNORMAL LOW (ref 12.0–15.0)
MCH: 29.2 pg (ref 26.0–34.0)
MCHC: 34.9 g/dL (ref 30.0–36.0)

## 2011-08-22 MED ORDER — SENNOSIDES-DOCUSATE SODIUM 8.6-50 MG PO TABS
1.0000 | ORAL_TABLET | Freq: Every day | ORAL | Status: AC | PRN
Start: 2011-08-22 — End: 2012-08-21

## 2011-08-22 MED ORDER — ASPIRIN 81 MG PO CHEW
81.0000 mg | CHEWABLE_TABLET | Freq: Every day | ORAL | Status: DC
  Administered 2011-08-22: 81 mg via ORAL
  Filled 2011-08-22: qty 1

## 2011-08-22 MED ORDER — THIAMINE HCL 100 MG PO TABS: 100.0000 mg | ORAL_TABLET | Freq: Every day | ORAL | Status: AC

## 2011-08-22 MED ORDER — CYANOCOBALAMIN 1000 MCG PO TABS: 1000.0000 ug | ORAL_TABLET | Freq: Every day | ORAL | Status: AC

## 2011-08-22 MED ORDER — PRAVASTATIN SODIUM 20 MG PO TABS: 20.0000 mg | ORAL_TABLET | Freq: Every day | ORAL | Status: DC

## 2011-08-22 MED ORDER — FERROUS SULFATE 325 (65 FE) MG PO TABS: 325.0000 mg | ORAL_TABLET | Freq: Every day | ORAL | Status: DC

## 2011-08-22 MED ORDER — VITAMIN B-12 1000 MCG PO TABS
1000.0000 ug | ORAL_TABLET | Freq: Every day | ORAL | Status: DC
  Filled 2011-08-22: qty 1

## 2011-08-22 MED ORDER — FOLIC ACID 1 MG PO TABS: 1.0000 mg | ORAL_TABLET | Freq: Every day | ORAL | Status: AC

## 2011-08-22 NOTE — Progress Notes (Signed)
Stroke Team Progress Note  HISTORY Sharon Hunt is an 41 y.o. female history of hypertension presenting following an episode of unresponsiveness followed by confusion as well as reduce speech output and reduced movement of left extremities.   Patient was brought to the emergency room and code stroke status. Patient's speech returned to normal in the emergency room after about 20-30 minutes. On evaluation in the ER she was felt to have a probable generalized seizure followed by postictal state with confusion with reduced speech output as well as reduced weakness of left extremities transiently.  Her alcohol level was elevated on admission and she admitted to drinking 4 alcoholic beverages daily. In addition, she did mention to [place] that her father had a stroke in his 33's. She feels that she has returned back to her normal baseline without any residual symptoms.   SUBJECTIVE Overall she feels her condition is completely resolved. She is lying flat in the bed resting.She denies any prior h/o stroke, TIA or any current focal neurologic symptoms.   OBJECTIVE Most recent Vital Signs: Filed Vitals:   08/22/11 0241 08/22/11 0307 08/22/11 0713 08/22/11 1000  BP: 166/114 154/99 158/90 147/72  Pulse: 97 73 78 74  Temp: 98.7 F (37.1 C) 98.7 F (37.1 C) 98.6 F (37 C) 98.8 F (37.1 C)  TempSrc: Oral Oral Oral Oral  Resp: 18 18 18 18   Height:      Weight:      SpO2: 97% 96% 98% 99%   CBG (last 3)   Basename 08/21/11 2233 08/20/11 2246  GLUCAP 182* 107*   Intake/Output from previous day: 06/06 0701 - 06/07 0700 In: 2400 [I.V.:2400] Out: -   IV Fluid Intake:     . 0.9 % NaCl with KCl 20 mEq / L 100 mL/hr at 08/22/11 0535    MEDICATIONS    . amLODipine  5 mg Oral Daily  . antiseptic oral rinse  15 mL Mouth Rinse q12n4p  . cyanocobalamin  1,000 mcg Intramuscular Once  . feeding supplement  237 mL Oral Q1200  . ferrous sulfate  325 mg Oral Q breakfast  . folic acid  1 mg Oral Daily   . gadobenate dimeglumine  20 mL Intravenous Once  . lisinopril  20 mg Oral Daily  . LORazepam  0-4 mg Oral Q6H   Followed by  . LORazepam  0-4 mg Oral Q12H  . multivitamin with minerals  1 tablet Oral Daily  . potassium chloride  30 mEq Oral TID  . thiamine  100 mg Oral Daily   Or  . thiamine  100 mg Intravenous Daily   PRN:  LORazepam, LORazepam, senna-docusate  Diet:  General regular liquids Activity: up ad lib DVT Prophylaxis:  Asa 81mg  daily  CLINICALLY SIGNIFICANT STUDIES Basic Metabolic Panel:  Lab 08/22/11 0454 08/21/11 0613  NA 139 144  K 3.9 3.1*  CL 107 108  CO2 23 21  GLUCOSE 89 82  BUN 6 7  CREATININE 0.76 0.87  CALCIUM 8.7 8.3*  MG 1.6 --  PHOS -- --   Liver Function Tests:  Lab 08/22/11 0655 08/21/11 0613  AST 27 29  ALT 12 13  ALKPHOS 64 61  BILITOT 0.6 0.3  PROT 6.2 6.0  ALBUMIN 3.3* 3.3*   CBC:  Lab 08/22/11 0655 08/21/11 0613 08/20/11 2222  WBC 7.9 6.5 --  NEUTROABS -- -- 3.7  HGB 11.4* 10.8* --  HCT 32.7* 30.9* --  MCV 83.6 82.8 --  PLT 213 206 --  Coagulation:  Lab 08/20/11 2222  LABPROT 13.6  INR 1.02   Cardiac Enzymes:  Lab 08/21/11 1654 08/21/11 1506 08/20/11 2222  CKTOTAL 328* 331* 318*  CKMB 2.7 2.7 2.4  CKMBINDEX -- -- --  TROPONINI <0.30 <0.30 <0.30   Urinalysis: No results found for this basename: COLORURINE:2,APPERANCEUR:2,LABSPEC:2,PHURINE:2,GLUCOSEU:2,HGBUR:2,BILIRUBINUR:2,KETONESUR:2,PROTEINUR:2,UROBILINOGEN:2,NITRITE:2,LEUKOCYTESUR:2 in the last 168 hours Lipid Panel    Component Value Date/Time   CHOL 212* 08/21/2011 0613   TRIG 105 08/21/2011 0613   HDL 59 08/21/2011 0613   CHOLHDL 3.6 08/21/2011 0613   VLDL 21 08/21/2011 0613   LDLCALC 132* 08/21/2011 0613   HgbA1C  Lab Results  Component Value Date   HGBA1C 5.2 08/21/2011    Urine Drug Screen:     Component Value Date/Time   LABOPIA NONE DETECTED 08/21/2011 0650   COCAINSCRNUR NONE DETECTED 08/21/2011 0650   LABBENZ NONE DETECTED 08/21/2011 0650   AMPHETMU NONE  DETECTED 08/21/2011 0650   THCU NONE DETECTED 08/21/2011 0650   LABBARB NONE DETECTED 08/21/2011 0650    Alcohol Level:  Lab 08/20/11 2302  ETH 233*    Dg Chest 2 View  08/21/2011  *RADIOLOGY REPORT*  Clinical Data: Stroke, hypertension  CHEST - 2 VIEW  Comparison: 10/16/2010  Findings: Normal heart size, mediastinal contours, and pulmonary vascularity. Lungs clear. No pleural effusion or pneumothorax. Bones unremarkable. Bilateral nipple rings.  IMPRESSION: No acute abnormalities.  Original Report Authenticated By: Lollie Marrow, M.D.   Ct Head Wo Contrast  08/20/2011  *RADIOLOGY REPORT*  Clinical Data: Left-sided weakness.  Code stroke.  CT HEAD WITHOUT CONTRAST  Technique:  Contiguous axial images were obtained from the base of the skull through the vertex without contrast.  Comparison: Brain MRI 10/18/2010.  Findings: No acute intracranial abnormalities.  Specifically, no definite evidence of acute/subacute cerebral ischemia, no acute intracranial hemorrhage, no focal mass, mass effect, hydrocephalus or abnormal intra or extra-axial fluid collections.  No acute displaced skull fractures are identified.  Visualized paranasal sinuses and mastoids are well pneumatized.  IMPRESSION: 1.  No acute intracranial abnormalities. 2.  The appearance the brain is normal.  These results were called by telephone on 08/20/2011  at  10:32 p.m. to  Dr. Manus Gunning, who verbally acknowledged these results.  Original Report Authenticated By: Florencia Reasons, M.D.   Mr Laqueta Jean WG Contrast  08/21/2011  *RADIOLOGY REPORT*  Clinical Data: Left-sided weakness.  Found down.  History of alcohol use.  Hypertension.  Possible seizure related to alcoholism.  MRI HEAD WITHOUT AND WITH CONTRAST  Technique:  Multiplanar, multiecho pulse sequences of the brain and surrounding structures were obtained according to standard protocol without and with intravenous contrast  Contrast: 20mL MULTIHANCE GADOBENATE DIMEGLUMINE 529 MG/ML IV SOLN   Comparison: 08/20/2011 head CT.  10/18/2010 brain MR.  Findings: Questionable tiny acute non hemorrhagic infarct posterior limb of the right internal capsule (series 3 image 15).  Minimal nonspecific white matter type changes unchanged.  No intracranial mass or abnormal enhancement noted on the motion degraded contrast enhanced imaging.  No intracranial hemorrhage.  Exophthalmos.  Minimal paranasal sinus mucosal thickening.   Minimal partial opacification inferior aspect of the mastoid air cells.  Major intracranial vascular structures are patent.  Cervical medullary junction unremarkable.  IMPRESSION: Questionable tiny acute non hemorrhagic infarct posterior limb of the right internal capsule.  Please see above.  Original Report Authenticated By: Fuller Canada, M.D.    EKG  normal EKG, normal sinus rhythm, unchanged from previous tracings, normal sinus rhythm  Physical  Exam    Cranial Nerves:  II: visual fields grossly normal, pupils equal, round, reactive to light and accommodation  III,IV, VI: ptosis not present, extra-ocular motions intact bilaterally  V,VII: smile symmetric, facial light touch sensation normal bilaterally  VIII: hearing normal bilaterally  IX,X: gag reflex present  XI: trapezius strength/neck flexion strength normal bilaterally  XII: tongue strength normal  Motor:  Right : Upper extremity 5/5 Left: Lower extremity 5/5 Lower extremity.Tone and bulk:normal tone throughout; no atrophy noted  Sensory: Pinprick and light touch intact throughout, bilaterally  Deep Tendon Reflexes: 1+ and symmetric throughout  Plantars:  Right: downgoing Left: downgoing  Cerebellar:  normal finger-to-nose and normal heel-to-shin test    OT/PT no needs identified.  EEG: NORMAL AWAKE EEG  ASSESSMENT Ms. CIARRAH RAE is a 41 y.o. female with a left sided weakness after epicode concerning for seizures. She has known alcohol abuse and this was felt to be a possible additive factor to her  symptoms. Now on aspirin 81 mg orally every day for secondary stroke prevention.    Hospital day # 2  TREATMENT/PLAN -Continue aspirin 81mg  daily for secondary stroke prevention. -aggressive management of blood pressure. -recommend stroke workup. Discussed with Dr. Laural Benes who plans to refer her to primary MD, Dr. Mayford Knife for  outpatient workup:recommendations to include hypercoagulability testing, ANA, ESR, and further stroke testing to include MRA brain, carotid u/s and possible TEE. Patient is to followup with Dr. Pearlean Brownie within 2 mos and is to see her primary MD for acute management, Dr Mayford Knife as discussed in plan of care.   Guy Franco, [facility],  MBA, [facility]  [phone] Dr. Delia Heady  Patient was seen and examined by Dr. Pearlean Brownie who provided plan of care/recommendations for Ms. Habenicht.

## 2011-08-22 NOTE — Progress Notes (Signed)
Physical Therapy Treatment Patient Details Name: Sharon Hunt MRN: [id number] DOB: November 17, 1970 Today's Date: 08/22/2011 Time: 4098-1191 PT Time Calculation (min): 9 min  PT Assessment / Plan / Recommendation Comments on Treatment Session  Pt at baseline functional level. Minimal balance deficits during head turns, although no significant fall risk. Pt plan to d/c home this afternoon.    Follow Up Recommendations  No PT follow up    Barriers to Discharge        Equipment Recommendations  None recommended by PT    Recommendations for Other Services    Frequency     Plan All goals met and education completed, patient dischaged from PT services    Precautions / Restrictions Precautions Precautions: None Restrictions Weight Bearing Restrictions: No       Mobility  Bed Mobility Bed Mobility: Not assessed Transfers Transfers: Sit to Stand;Stand to Sit Sit to Stand: 7: Independent Stand to Sit: 7: Independent Ambulation/Gait Ambulation/Gait Assistance: 7: Independent Ambulation Distance (Feet): 250 Feet Assistive device: None Ambulation/Gait Assistance Details: No assist needed Gait Pattern: Within Functional Limits Stairs: Yes Stairs Assistance: 7: Independent Stairs Assistance Details (indicate cue type and reason): No assist needed Stair Management Technique: No rails;Alternating pattern Number of Stairs: 5  Modified Rankin (Stroke Patients Only) Modified Rankin: No significant disability      PT Goals Acute Rehab PT Goals PT Goal: Sit to Stand - Progress: Met PT Goal: Stand to Sit - Progress: Met PT Transfer Goal: Bed to Chair/Chair to Bed - Progress: Met PT Goal: Ambulate - Progress: Met PT Goal: Up/Down Stairs - Progress: Progressing toward goal Additional Goals PT Goal: Additional Goal #1 - Progress: Met  Visit Information  Last PT Received On: 08/22/11 Assistance Needed: +1    Subjective Data      Cognition  Overall Cognitive Status: Appears  within functional limits for tasks assessed/performed Arousal/Alertness: Awake/alert Orientation Level: Appears intact for tasks assessed Behavior During Session: [facility] for tasks performed    Balance  Balance Balance Assessed: Yes Standardized Balance Assessment Standardized Balance Assessment: Dynamic Gait Index Dynamic Gait Index Level Surface: Normal Change in Gait Speed: Normal Gait with Horizontal Head Turns: Mild Impairment Gait with Vertical Head Turns: Mild Impairment Gait and Pivot Turn: Normal Step Over Obstacle: Normal Step Around Obstacles: Normal Steps: Normal Total Score: 22   End of Session PT - End of Session Equipment Utilized During Treatment: Gait belt Activity Tolerance: Patient tolerated treatment well Patient left: in chair;with call bell/phone within reach;with family/visitor present Nurse Communication: Mobility status    Milana Kidney 08/22/2011, 5:11 PM  08/22/2011 Milana Kidney DPT PAGER: [phone] OFFICE: [phone]

## 2011-08-22 NOTE — Discharge Instructions (Signed)
STROKE/TIA DISCHARGE INSTRUCTIONS SMOKING Cigarette smoking nearly doubles your risk of having a stroke & is the single most alterable risk factor  If you smoke or have smoked in the last 12 months, you are advised to quit smoking for your health.  Most of the excess cardiovascular risk related to smoking disappears within a year of stopping.  Ask you doctor about anti-smoking medications  [place] Quit Line: 1-800-QUIT NOW  Free Smoking Cessation Classes [phone]  CHOLESTEROL Know your levels; limit fat & cholesterol in your diet  Lipid Panel     Component Value Date/Time   CHOL 203* 10/17/2010 0640   TRIG 56 10/17/2010 0640   HDL 81 10/17/2010 0640   CHOLHDL 2.5 10/17/2010 0640   VLDL 11 10/17/2010 0640   LDLCALC 111* 10/17/2010 0640      Many patients benefit from treatment even if their cholesterol is at goal.  Goal: Total Cholesterol (CHOL) less than 160  Goal:  Triglycerides (TRIG) less than 150  Goal:  HDL greater than 40  Goal:  LDL (LDLCALC) less than 100   BLOOD PRESSURE American Stroke Association blood pressure target is less that 120/80 mm/Hg  Your discharge blood pressure is:  BP: 118/76 mmHg  Monitor your blood pressure  Limit your salt and alcohol intake  Many individuals will require more than one medication for high blood pressure  DIABETES (A1c is a blood sugar average for last 3 months) Goal HGBA1c is under 7% (HBGA1c is blood sugar average for last 3 months)  Diabetes: {STROKE DC DIABETES:22357}    No results found for this basename: HGBA1C     Your HGBA1c can be lowered with medications, healthy diet, and exercise.  Check your blood sugar as directed by your physician  Call your physician if you experience unexplained or low blood sugars.  PHYSICAL ACTIVITY/REHABILITATION Goal is 30 minutes at least 4 days per week    {STROKE DC ACTIVITY/REHAB:22359}  Activity decreases your risk of heart attack and stroke and makes your heart stronger.  It helps control  your weight and blood pressure; helps you relax and can improve your mood.  Participate in a regular exercise program.  Talk with your doctor about the best form of exercise for you (dancing, walking, swimming, cycling).  DIET/WEIGHT Goal is to maintain a healthy weight  Your discharge diet is: NPO *** liquids Your height is:  Height: 5\' 5"  (165.1 cm) Your current weight is: Weight: 75.751 kg (167 lb) Your Body Mass Index (BMI) is:  BMI (Calculated): 27.8   Following the type of diet specifically designed for you will help prevent another stroke.  Your goal weight range is:  ***  Your goal Body Mass Index (BMI) is 19-24.  Healthy food habits can help reduce 3 risk factors for stroke:  High cholesterol, hypertension, and excess weight.  RESOURCES Stroke/Support Group:  Call [phone]  they meet the 3rd Sunday of the month on the Rehab Unit at [facility], [place] ( no meetings June, July & Aug).  STROKE EDUCATION PROVIDED/REVIEWED AND GIVEN TO PATIENT Stroke warning signs and symptoms How to activate emergency medical system (call 911). Medications prescribed at discharge. Need for follow-up after discharge. Personal risk factors for stroke. Pneumonia vaccine given:   {STROKE DC YES/NO/DATE:22363} Flu vaccine given:   {STROKE DC YES/NO/DATE:22363} My questions have been answered, the writing is legible, and I understand these instructions.  I will adhere to these goals & educational materials that have been provided to me after my  discharge from the hospital.     Cyanocobalamin, Vitamin B12 injection What is this medicine? CYANOCOBALAMIN (sye an oh koe BAL a min) is a man made form of vitamin B12. Vitamin B12 is used in the growth of healthy blood cells, nerve cells, and proteins in the body. It also helps with the metabolism of fats and carbohydrates. This medicine is used to treat people who can not absorb vitamin B12. This medicine may be used for other purposes; ask your health care  provider or pharmacist if you have questions. What should I tell my health care provider before I take this medicine? They need to know if you have any of these conditions: -kidney disease -Leber's disease -megaloblastic anemia -an unusual or allergic reaction to cyanocobalamin, cobalt, other medicines, foods, dyes, or preservatives -pregnant or trying to get pregnant -breast-feeding How should I use this medicine? This medicine is injected into a muscle or deeply under the skin. It is usually given by a health care professional in a clinic or doctor's office. However, your doctor may teach you how to inject yourself. Follow all instructions. Talk to your pediatrician regarding the use of this medicine in children. Special care may be needed. Overdosage: If you think you have taken too much of this medicine contact a poison control center or emergency room at once. NOTE: This medicine is only for you. Do not share this medicine with others. What if I miss a dose? If you are given your dose at a clinic or doctor's office, call to reschedule your appointment. If you give your own injections and you miss a dose, take it as soon as you can. If it is almost time for your next dose, take only that dose. Do not take double or extra doses. What may interact with this medicine? -colchicine -heavy alcohol intake This list may not describe all possible interactions. Give your health care provider a list of all the medicines, herbs, non-prescription drugs, or dietary supplements you use. Also tell them if you smoke, drink alcohol, or use illegal drugs. Some items may interact with your medicine. What should I watch for while using this medicine? Visit your doctor or health care professional regularly. You may need blood work done while you are taking this medicine. You may need to follow a special diet. Talk to your doctor. Limit your alcohol intake and avoid smoking to get the best benefit. What side  effects may I notice from receiving this medicine? Side effects that you should report to your doctor or health care professional as soon as possible: -allergic reactions like skin rash, itching or hives, swelling of the face, lips, or tongue -blue tint to skin -chest tightness, pain -difficulty breathing, wheezing -dizziness -red, swollen painful area on the leg Side effects that usually do not require medical attention (report to your doctor or health care professional if they continue or are bothersome): -diarrhea -headache This list may not describe all possible side effects. Call your doctor for medical advice about side effects. You may report side effects to FDA at 1-800-FDA-1088. Where should I keep my medicine? Keep out of the reach of children. Store at room temperature between 15 and 30 degrees C (59 and 85 degrees F). Protect from light. Throw away any unused medicine after the expiration date. NOTE: This sheet is a summary. It may not cover all possible information. If you have questions about this medicine, talk to your doctor, pharmacist, or health care provider.  2012, Elsevier/Gold Standard. (06/14/2007  10:10:20 PM)  Hypertension As your heart beats, it forces blood through your arteries. This force is your blood pressure. If the pressure is too high, it is called hypertension (HTN) or high blood pressure. HTN is dangerous because you may have it and not know it. High blood pressure may mean that your heart has to work harder to pump blood. Your arteries may be narrow or stiff. The extra work puts you at risk for heart disease, stroke, and other problems.  Blood pressure consists of two numbers, a higher number over a lower, 110/72, for example. It is stated as "110 over 72." The ideal is below 120 for the top number (systolic) and under 80 for the bottom (diastolic). Write down your blood pressure today. You should pay close attention to your blood pressure if you have certain  conditions such as:  Heart failure.   Prior heart attack.   Diabetes   Chronic kidney disease.   Prior stroke.   Multiple risk factors for heart disease.  To see if you have HTN, your blood pressure should be measured while you are seated with your arm held at the level of the heart. It should be measured at least twice. A one-time elevated blood pressure reading (especially in the Emergency Department) does not mean that you need treatment. There may be conditions in which the blood pressure is different between your right and left arms. It is important to see your caregiver soon for a recheck. Most people have essential hypertension which means that there is not a specific cause. This type of high blood pressure may be lowered by changing lifestyle factors such as:  Stress.   Smoking.   Lack of exercise.   Excessive weight.   Drug/tobacco/alcohol use.   Eating less salt.  Most people do not have symptoms from high blood pressure until it has caused damage to the body. Effective treatment can often prevent, delay or reduce that damage. TREATMENT  When a cause has been identified, treatment for high blood pressure is directed at the cause. There are a large number of medications to treat HTN. These fall into several categories, and your caregiver will help you select the medicines that are best for you. Medications may have side effects. You should review side effects with your caregiver. If your blood pressure stays high after you have made lifestyle changes or started on medicines,   Your medication(s) may need to be changed.   Other problems may need to be addressed.   Be certain you understand your prescriptions, and know how and when to take your medicine.   Be sure to follow up with your caregiver within the time frame advised (usually within two weeks) to have your blood pressure rechecked and to review your medications.   If you are taking more than one medicine to  lower your blood pressure, make sure you know how and at what times they should be taken. Taking two medicines at the same time can result in blood pressure that is too low.  SEEK IMMEDIATE MEDICAL CARE IF:  You develop a severe headache, blurred or changing vision, or confusion.   You have unusual weakness or numbness, or a faint feeling.   You have severe chest or abdominal pain, vomiting, or breathing problems.  MAKE SURE YOU:   Understand these instructions.   Will watch your condition.   Will get help right away if you are not doing well or get worse.  Document Released: 03/03/2005 Document  Revised: 02/20/2011 Document Reviewed: 10/22/2007 [facility] Patient Information 2012 [place], [place].  Iron Deficiency Anemia  Anemia is when you have a low number of healthy red blood cells. HOME CARE   Ask your doctor or dietician what foods you should eat.   Take iron and vitamins as told by your doctor.   Eat foods that have iron in them. This includes liver, lean beef, whole-grain bread, eggs, dried fruit, and dark green leafy vegetables.  GET HELP RIGHT AWAY IF:  You pass out (faint).   You have chest pain.   You feel sick to your stomach (nauseous) or throw up (vomit).   You get very short of breath with activity.   You are weak.   You are thirstier than normal.   You have a fast heartbeat.   You start to sweat or become lightheaded when getting up from a chair or bed.  MAKE SURE YOU:  Understand these instructions.   Will watch your condition.   Will get help right away if you are not doing well or get worse.  Document Released: 04/05/2010 Document Revised: 02/20/2011 Document Reviewed: 04/05/2010 [facility] Patient Information 2012 [place], [place].  Syncope Syncope (fainting) is a sudden, short loss of consciousness. People normally fall to the ground when they faint. Recovery is often fast. HOME CARE  Do not drive or use machines. Wait until your doctor says it  is safe to do so.   If you have diabetes, check your blood sugar. If it is low (below 70), you need to drink or eat something sweet. If over 300, call your doctor.   If you have a blood pressure machine at home, take your blood pressure. If the top number is below 100 or above 170, call your doctor.   Lie down until you feel normal.   Drink extra fluids (water, juice, soup).  GET HELP RIGHT AWAY IF:   You pass out (faint) when sitting or lying down. Do not drive. Call your local emergency services (911 in [place] if no one is there to help you.   There is chest pain.   You feel sick to your stomach (nauseous) or keep throwing up (vomiting).   You have very bad belly (abdominal) pain.   You feel your heartbeat is fast or not normal.   You lose feeling in some part of the body.   You cannot move your arms or legs.   You cannot talk well and get confused.   You feel weak or cannot see well.   You get sweaty and feel lightheaded.  MAKE SURE YOU:   Understand these instructions.   Will watch your condition.   Will get help right away if you are not doing well or get worse.  Document Released: 08/20/2007 Document Revised: 02/20/2011 Document Reviewed: 08/20/2007 [facility] Patient Information 2012 [place], [place].  Smoking Cessation, Tips for Success YOU CAN QUIT SMOKING If you are ready to quit smoking, congratulations! You have chosen to help yourself be healthier. Cigarettes bring nicotine, tar, carbon monoxide, and other irritants into your body. Your lungs, heart, and blood vessels will be able to work better without these poisons. There are many different ways to quit smoking. Nicotine gum, nicotine patches, a nicotine inhaler, or nicotine nasal spray can help with physical craving. Hypnosis, support groups, and medicines help break the habit of smoking. Here are some tips to help you quit for good.  Throw away all cigarettes.   Clean and remove all ashtrays from your  home,  work, and car.   On a card, write down your reasons for quitting. Carry the card with you and read it when you get the urge to smoke.   Cleanse your body of nicotine. Drink enough water and fluids to keep your urine clear or pale yellow. Do this after quitting to flush the nicotine from your body.   Learn to predict your moods. Do not let a bad situation be your excuse to have a cigarette. Some situations in your life might tempt you into wanting a cigarette.   Never have "just one" cigarette. It leads to wanting another and another. Remind yourself of your decision to quit.   Change habits associated with smoking. If you smoked while driving or when feeling stressed, try other activities to replace smoking. Stand up when drinking your coffee. Brush your teeth after eating. Sit in a different chair when you read the paper. Avoid alcohol while trying to quit, and try to drink fewer caffeinated beverages. Alcohol and caffeine may urge you to smoke.   Avoid foods and drinks that can trigger a desire to smoke, such as sugary or spicy foods and alcohol.   Ask people who smoke not to smoke around you.   Have something planned to do right after eating or having a cup of coffee. Take a walk or exercise to perk you up. This will help to keep you from overeating.   Try a relaxation exercise to calm you down and decrease your stress. Remember, you may be tense and nervous for the first 2 weeks after you quit, but this will pass.   Find new activities to keep your hands busy. Play with a pen, coin, or rubber band. Doodle or draw things on paper.   Brush your teeth right after eating. This will help cut down on the craving for the taste of tobacco after meals. You can try mouthwash, too.   Use oral substitutes, such as lemon drops, carrots, a cinnamon stick, or chewing gum, in place of cigarettes. Keep them handy so they are available when you have the urge to smoke.   When you have the urge to smoke,  try deep breathing.   Designate your home as a nonsmoking area.   If you are a heavy smoker, ask your caregiver about a prescription for nicotine chewing gum. It can ease your withdrawal from nicotine.   Reward yourself. Set aside the cigarette money you save and buy yourself something nice.   Look for support from others. Join a support group or smoking cessation program. Ask someone at home or at work to help you with your plan to quit smoking.   Always ask yourself, "Do I need this cigarette or is this just a reflex?" Tell yourself, "Today, I choose not to smoke," or "I do not want to smoke." You are reminding yourself of your decision to quit, even if you do smoke a cigarette.  HOW WILL I FEEL WHEN I QUIT SMOKING?  The benefits of not smoking start within days of quitting.   You may have symptoms of withdrawal because your body is used to nicotine (the addictive substance in cigarettes). You may crave cigarettes, be irritable, feel very hungry, cough often, get headaches, or have difficulty concentrating.   The withdrawal symptoms are only temporary. They are strongest when you first quit but will go away within 10 to 14 days.   When withdrawal symptoms occur, stay in control. Think about your reasons for quitting.  Remind yourself that these are signs that your body is healing and getting used to being without cigarettes.   Remember that withdrawal symptoms are easier to treat than the major diseases that smoking can cause.   Even after the withdrawal is over, expect periodic urges to smoke. However, these cravings are generally short-lived and will go away whether you smoke or not. Do not smoke!   If you relapse and smoke again, do not lose hope. Most smokers quit 3 times before they are successful.   If you relapse, do not give up! Plan ahead and think about what you will do the next time you get the urge to smoke.  LIFE AS A NONSMOKER: MAKE IT FOR A MONTH, MAKE IT FOR LIFE Day 1:  Hang this page where you will see it every day. Day 2: Get rid of all ashtrays, matches, and lighters. Day 3: Drink water. Breathe deeply between sips. Day 4: Avoid places with smoke-filled air, such as bars, clubs, or the smoking section of restaurants. Day 5: Keep track of how much money you save by not smoking. Day 6: Avoid boredom. Keep a good book with you or go to the movies. Day 7: Reward yourself! One week without smoking! Day 8: Make a dental appointment to get your teeth cleaned. Day 9: Decide how you will turn down a cigarette before it is offered to you. Day 10: Review your reasons for quitting. Day 11: Distract yourself. Stay active to keep your mind off smoking and to relieve tension. Take a walk, exercise, read a book, do a crossword puzzle, or try a new hobby. Day 12: Exercise. Get off the bus before your stop or use stairs instead of escalators. Day 13: Call on friends for support and encouragement. Day 14: Reward yourself! Two weeks without smoking! Day 15: Practice deep breathing exercises. Day 16: Bet a friend that you can stay a nonsmoker. Day 17: Ask to sit in nonsmoking sections of restaurants. Day 18: Hang up "No Smoking" signs. Day 19: Think of yourself as a nonsmoker. Day 20: Each morning, tell yourself you will not smoke. Day 21: Reward yourself! Three weeks without smoking! Day 22: Think of smoking in negative ways. Remember how it stains your teeth, gives you bad breath, and leaves you short of breath. Day 23: Eat a nutritious breakfast. Day 24:Do not relive your days as a smoker. Day 25: Hold a pencil in your hand when talking on the telephone. Day 26: Tell all your friends you do not smoke. Day 27: Think about how much better food tastes. Day 28: Remember, one cigarette is one too many. Day 29: Take up a hobby that will keep your hands busy. Day 30: Congratulations! One month without smoking! Give yourself a big reward. Your caregiver can direct you to  community resources or hospitals for support, which may include:  Group support.   Education.   Hypnosis.   Subliminal therapy.  Document Released: 11/30/2003 Document Revised: 02/20/2011 Document Reviewed: 12/18/2008 [facility] Patient Information 2012 [place], [place].  Alcohol Intoxication Alcohol intoxication means your blood alcohol level is above legal limits. Alcohol is a drug. It has serious side effects. These side effects can include:  Damage to your organs (liver, nervous system, and blood system).   Unclear thinking.   Slowed reflexes.   Decreased muscle coordination.  HOME CARE  Do not drink and drive.   Do not drink alcohol if you are taking medicine or using other drugs. Doing so can  cause serious medical problems or even death.   Drink enough water and fluids to keep your pee (urine) clear or pale yellow.   Eat healthy foods.   Only take medicine as told by your doctor.   Join an alcohol support group.  GET HELP RIGHT AWAY IF:  You become shaky when you stop drinking.   Your thinking is unclear or you become confused.   You throw up (vomit) blood. It may look bright red or like coffee grounds.   You notice blood in your poop (bowel movements).   You become lightheaded or pass out (faint).  MAKE SURE YOU:   Understand these instructions.   Will watch your condition.   Will get help right away if you are not doing well or get worse.  Document Released: 08/20/2007 Document Revised: 02/20/2011 Document Reviewed: 08/20/2009 [facility] Patient Information 2012 [place], [place].

## 2011-08-22 NOTE — Discharge Summary (Addendum)
Physician Discharge Summary  Patient ID: Sharon Hunt MRN: [id number] DOB/AGE: 03/23/1970 41 y.o.  Admit date: 08/20/2011 Discharge date: 08/22/2011  Admission Diagnoses: #1. Left-sided weakness after unresponsive episode concerning for seizures  #2. Alcohol abuse - patient is placed on alcohol withdrawal protocol.  #3. Hypertension - continue home medications except for HCTZ as we are hydrating the patient.  #4. Tobacco abuse - strongly advised to quit smoking.  #5. Anemia - check anemia panel.  Discharge Diagnoses:  Principal Problem:  *Left-sided weakness Active Problems:  Alcohol abuse  HTN (hypertension)  Tobacco abuse  Low vitamin B12 level  Iron deficiency  Alcohol intoxication  Things To Follow Up Outpatient:  Discussed with inpatient neurology team and they recommended outpatient workup recommendations to include hypercoagulability testing, ANA, ESR, and further stroke testing to include MRA brain, and possible TEE.  This can be done through primary care physician as outpatient.  Patient is to followup with Dr. Pearlean Brownie within 2 mos and is to see her primary MD for acute management, Dr Mayford Knife as discussed in plan of care.  Discharged Condition: good  Hospital Course:  #1. Left-sided weakness after unresponsive episode concerning for seizures - neurologist has already evaluated the patient. Patient was placed on neurochecks and seizure precautions. No further seizure activity found.  Pt was placed on alcohol withdrawal protocol.  MRI of the brain suspicious for small acute non hemorrhagic infarct posterior limb of the right internal capsule.  #2. Alcohol abuse - patient is placed on alcohol withdrawal protocol. No delerium tremens.    #3. Hypertension - continue home medications.   #4. Tobacco abuse - strongly advised to quit smoking. Cessation counseling was provided.   #5. Anemia - Pt found to be deficient in iron and B12.  Pt declined a B12 injection.  She will be sent  home with oral B12 tablets.    Consults: neurology  Significant Diagnostic Studies:  Urine Drug Screen:   Component  Value  Date/Time    LABOPIA  NONE DETECTED  08/21/2011 0650    COCAINSCRNUR  NONE DETECTED  08/21/2011 0650    LABBENZ  NONE DETECTED  08/21/2011 0650    AMPHETMU  NONE DETECTED  08/21/2011 0650    THCU  NONE DETECTED  08/21/2011 0650    LABBARB  NONE DETECTED  08/21/2011 0650    Alcohol Level:  Lab  08/20/11 2302   ETH  233*    Dg Chest 2 View  08/21/2011 *RADIOLOGY REPORT* Clinical Data: Stroke, hypertension CHEST - 2 VIEW Comparison: 10/16/2010 Findings: Normal heart size, mediastinal contours, and pulmonary vascularity. Lungs clear. No pleural effusion or pneumothorax. Bones unremarkable. Bilateral nipple rings. IMPRESSION: No acute abnormalities. Original Report Authenticated By: Lollie Marrow, M.D.   Ct Head Wo Contrast  08/20/2011 *RADIOLOGY REPORT* Clinical Data: Left-sided weakness. Code stroke. CT HEAD WITHOUT CONTRAST Technique: Contiguous axial images were obtained from the base of the skull through the vertex without contrast. Comparison: Brain MRI 10/18/2010. Findings: No acute intracranial abnormalities. Specifically, no definite evidence of acute/subacute cerebral ischemia, no acute intracranial hemorrhage, no focal mass, mass effect, hydrocephalus or abnormal intra or extra-axial fluid collections. No acute displaced skull fractures are identified. Visualized paranasal sinuses and mastoids are well pneumatized. IMPRESSION: 1. No acute intracranial abnormalities. 2. The appearance the brain is normal. These results were called by telephone on 08/20/2011 at 10:32 p.m. to Dr. Manus Gunning, who verbally acknowledged these results. Original Report Authenticated By: Florencia Reasons, M.D.   Mr Laqueta Jean Wo  Contrast  08/21/2011 *RADIOLOGY REPORT* Clinical Data: Left-sided weakness. Found down. History of alcohol use. Hypertension. Possible seizure related to alcoholism. MRI HEAD WITHOUT  AND WITH CONTRAST Technique: Multiplanar, multiecho pulse sequences of the brain and surrounding structures were obtained according to standard protocol without and with intravenous contrast Contrast: 20mL MULTIHANCE GADOBENATE DIMEGLUMINE 529 MG/ML IV SOLN Comparison: 08/20/2011 head CT. 10/18/2010 brain MR. Findings: Questionable tiny acute non hemorrhagic infarct posterior limb of the right internal capsule (series 3 image 15). Minimal nonspecific white matter type changes unchanged. No intracranial mass or abnormal enhancement noted on the motion degraded contrast enhanced imaging. No intracranial hemorrhage. Exophthalmos. Minimal paranasal sinus mucosal thickening. Minimal partial opacification inferior aspect of the mastoid air cells. Major intracranial vascular structures are patent. Cervical medullary junction unremarkable. IMPRESSION: Questionable tiny acute non hemorrhagic infarct posterior limb of the right internal capsule. Please see above. Original Report Authenticated By: Fuller Canada, M.D.   2D Echocardiogram Left ventricle: The cavity size was normal. Systolic function was normal. The estimated ejection fraction was in the range of 55% to 60%. Wall motion was normal; there were no regional wall motion abnormalities. Impressions: - No cardiac source of emboli was indentified.  EEG: This is a normal awake EEG  Carotid Doppler No significant extracranial carotid artery stenosis demonstrated. Vertebrals are patent with antegrade flow.  Discharge Exam: Blood pressure 147/72, pulse 74, temperature 98.8 F (37.1 C), temperature source Oral, resp. rate 18, height 5\' 5"  (1.651 m), weight 75.751 kg (167 lb), last menstrual period 08/15/2011, SpO2 99.00%. Constitutional: She is oriented to person, place, and time. She appears well-developed and well-nourished. No distress.  Head: Normocephalic and atraumatic.  Nose: Nose normal.  Mouth/Throat: Oropharynx is clear and moist. No oropharyngeal  exudate.  Eyes: Conjunctivae are normal. Pupils are equal, round, and reactive to light. Right eye exhibits no discharge. Left eye exhibits no discharge. No scleral icterus.  Neck: Normal range of motion. Neck supple.  Cardiovascular: Normal rate and regular rhythm.  Respiratory: Effort normal and breath sounds normal. No respiratory distress. She has no wheezes. She has no rales.  GI: Soft. Bowel sounds are normal. She exhibits no distension. There is no tenderness. There is no rebound.  Musculoskeletal: Normal range of motion. She exhibits no edema and no tenderness.  Neurological: She is alert and oriented to person, place, and time. No facial asymmetry. Tongue is midline. Skin: Skin is warm and dry. She is not diaphoretic.  Psychiatric: Her behavior is normal.   Disposition: 01-Home or Self Care   Medication List  As of 08/22/2011  3:13 PM   ASK your doctor about these medications         amLODipine 5 MG tablet   Commonly known as: NORVASC   Take 5 mg by mouth daily.      aspirin 81 MG chewable tablet   Chew 81 mg by mouth daily.      lisinopril-hydrochlorothiazide 20-25 MG per tablet   Commonly known as: PRINZIDE,ZESTORETIC   Take 1 tablet by mouth daily.           Follow-up Information    Follow up with Jearld Lesch, MD. Schedule an appointment as soon as possible for a visit in 1 week.   Contact information:   [address]  [facility] [place] [address] [phone]         I spent 38 mins preparing discharge, reviewing labs, tests, imaging, counseling patient, etc.   Signed: Thy Gullikson 08/22/2011, 3:13 PM

## 2011-08-22 NOTE — Consult Note (Signed)
Pt is a 1/2 ppd smoker who has quit several times on her own. She is interested in the Nicotrol Inhaler. Discussed the nicotrol inhaler instructions and referred pt to MD for Rx. Pt also had questions about the e- cigarettes which were addressed and discouraged their use. Referred to 1-800 quit now for f/u and support. Discussed oral fixation substitutes, second hand smoke and in home smoking policy. Reviewed and gave pt Written education/contact information.

## 2011-08-22 NOTE — Progress Notes (Signed)
PT Cancellation Note  Treatment cancelled today due to patient's refusal to participate. Pt extremely lethargic and requested to come back later this afternoon. Will attempt treatment this afternoon pending availability.  Thanks, 08/22/2011 Sharon Hunt DPT PAGER: [phone] OFFICE: [phone]    Sharon Hunt 08/22/2011, 10:37 AM

## 2012-05-19 DIAGNOSIS — F419 Anxiety disorder, unspecified: Secondary | ICD-10-CM | POA: Insufficient documentation

## 2012-12-30 ENCOUNTER — Other Ambulatory Visit: Payer: Self-pay | Admitting: Family Medicine

## 2012-12-30 DIAGNOSIS — Z1231 Encounter for screening mammogram for malignant neoplasm of breast: Secondary | ICD-10-CM

## 2013-02-25 ENCOUNTER — Encounter: Payer: Self-pay | Admitting: Obstetrics

## 2013-03-07 ENCOUNTER — Ambulatory Visit (INDEPENDENT_AMBULATORY_CARE_PROVIDER_SITE_OTHER): Payer: BC Managed Care – PPO | Admitting: Obstetrics

## 2013-03-07 ENCOUNTER — Encounter: Payer: Self-pay | Admitting: Obstetrics

## 2013-03-07 VITALS — BP 122/81 | HR 84 | Temp 98.4°F | Ht 66.0 in | Wt 186.0 lb

## 2013-03-07 DIAGNOSIS — N939 Abnormal uterine and vaginal bleeding, unspecified: Secondary | ICD-10-CM | POA: Insufficient documentation

## 2013-03-07 DIAGNOSIS — Z01419 Encounter for gynecological examination (general) (routine) without abnormal findings: Secondary | ICD-10-CM | POA: Insufficient documentation

## 2013-03-07 DIAGNOSIS — D259 Leiomyoma of uterus, unspecified: Secondary | ICD-10-CM

## 2013-03-07 DIAGNOSIS — N946 Dysmenorrhea, unspecified: Secondary | ICD-10-CM | POA: Insufficient documentation

## 2013-03-07 MED ORDER — MELOXICAM 7.5 MG PO TABS: 7.5000 mg | ORAL_TABLET | Freq: Every day | ORAL | Status: DC

## 2013-03-07 NOTE — Addendum Note (Signed)
Addended by: Elby Beck F on: 03/07/2013 04:15 PM   Modules accepted: Orders

## 2013-03-07 NOTE — Progress Notes (Signed)
Subjective:     Sharon Hunt is a 42 y.o. female here for a routine annual exam.  Current complaints:  Pt was given diagnosis of uterine fibroids in 2009. Pt states that her bleeding has gotten worse and is much more painful.  Pt states that intercourse is also painful.  Pt is in office to find out about fibroids and what course to take.  Pt would like to know if hysterectomy is the best option.    Personal health questionnaire reviewed: yes.   Gynecologic History Patient's last menstrual period was 02/22/2013. Contraception: tubal ligation Last Pap: years ago. Results were: normal Last mammogram: n/a  Obstetric History OB History  No data available     The following portions of the patient's history were reviewed and updated as appropriate: allergies, current medications, past family history, past medical history, past social history, past surgical history and problem list.  Review of Systems Pertinent items are noted in HPI.    Objective:    General appearance: alert and no distress Breasts: normal appearance, no masses or tenderness Abdomen: normal findings: soft, non-tender Pelvic: cervix normal in appearance, external genitalia normal, no adnexal masses or tenderness, no cervical motion tenderness, rectovaginal septum normal, uterus normal size, shape, and consistency and vagina normal without discharge    Assessment:    Healthy female exam.   Dysmenorrhea  ? Uterine Fibroids  AUB   Plan:    Education reviewed: safe sex/STD prevention, self breast exams and management of Dysmenorrhea. Contraception: tubal ligation. Mammogram ordered. Follow up in: 2 weeks. Ultrasound ordered.

## 2013-03-08 LAB — PAP IG W/ RFLX HPV ASCU

## 2013-03-08 LAB — WET PREP BY MOLECULAR PROBE
Gardnerella vaginalis: NEGATIVE
Trichomonas vaginosis: NEGATIVE

## 2013-03-08 LAB — GC/CHLAMYDIA PROBE AMP
CT Probe RNA: NEGATIVE
GC Probe RNA: NEGATIVE

## 2013-03-23 ENCOUNTER — Ambulatory Visit (HOSPITAL_COMMUNITY): Payer: Self-pay

## 2013-03-23 ENCOUNTER — Ambulatory Visit (HOSPITAL_COMMUNITY): Payer: Self-pay | Attending: Family Medicine

## 2015-03-09 ENCOUNTER — Encounter (HOSPITAL_COMMUNITY): Payer: Self-pay | Admitting: Family Medicine

## 2015-03-09 ENCOUNTER — Emergency Department (HOSPITAL_COMMUNITY)
Admission: EM | Admit: 2015-03-09 | Discharge: 2015-03-09 | Disposition: A | Discharge status: Home / Self Care | Payer: Medicaid Other | Attending: Emergency Medicine | Admitting: Emergency Medicine

## 2015-03-09 ENCOUNTER — Emergency Department (HOSPITAL_COMMUNITY): Payer: Medicaid Other

## 2015-03-09 DIAGNOSIS — Z3202 Encounter for pregnancy test, result negative: Secondary | ICD-10-CM | POA: Diagnosis not present

## 2015-03-09 DIAGNOSIS — Z79899 Other long term (current) drug therapy: Secondary | ICD-10-CM | POA: Diagnosis not present

## 2015-03-09 DIAGNOSIS — I10 Essential (primary) hypertension: Secondary | ICD-10-CM | POA: Insufficient documentation

## 2015-03-09 DIAGNOSIS — Z7982 Long term (current) use of aspirin: Secondary | ICD-10-CM | POA: Diagnosis not present

## 2015-03-09 DIAGNOSIS — K2970 Gastritis, unspecified, without bleeding: Secondary | ICD-10-CM | POA: Insufficient documentation

## 2015-03-09 DIAGNOSIS — F419 Anxiety disorder, unspecified: Secondary | ICD-10-CM | POA: Diagnosis not present

## 2015-03-09 DIAGNOSIS — R109 Unspecified abdominal pain: Secondary | ICD-10-CM | POA: Diagnosis present

## 2015-03-09 DIAGNOSIS — F17200 Nicotine dependence, unspecified, uncomplicated: Secondary | ICD-10-CM | POA: Insufficient documentation

## 2015-03-09 LAB — COMPREHENSIVE METABOLIC PANEL
ALBUMIN: 4.3 g/dL (ref 3.5–5.0)
ALT: 38 U/L (ref 14–54)
AST: 108 U/L — AB (ref 15–41)
Alkaline Phosphatase: 80 U/L (ref 38–126)
Anion gap: 20 — ABNORMAL HIGH (ref 5–15)
BILIRUBIN TOTAL: 1.5 mg/dL — AB (ref 0.3–1.2)
BUN: 7 mg/dL (ref 6–20)
CHLORIDE: 100 mmol/L — AB (ref 101–111)
CO2: 20 mmol/L — AB (ref 22–32)
Calcium: 9.7 mg/dL (ref 8.9–10.3)
Creatinine, Ser: 0.98 mg/dL (ref 0.44–1.00)
GFR calc Af Amer: >60 mL/min (ref >60)
GFR calc non Af Amer: >60 mL/min (ref >60)
GLUCOSE: 112 mg/dL — AB (ref 65–99)
POTASSIUM: 2.9 mmol/L — AB (ref 3.5–5.1)
SODIUM: 140 mmol/L (ref 135–145)
Total Protein: 7.4 g/dL (ref 6.5–8.1)

## 2015-03-09 LAB — CBC
HCT: 39.6 % (ref 36.0–46.0)
Hemoglobin: 13.9 g/dL (ref 12.0–15.0)
MCH: 29.2 pg (ref 26.0–34.0)
MCHC: 35.1 g/dL (ref 30.0–36.0)
MCV: 83.2 fL (ref 78.0–100.0)
Platelets: 252 10*3/uL (ref 150–400)
RBC: 4.76 MIL/uL (ref 3.87–5.11)
RDW: 22.7 % — AB (ref 11.5–15.5)
WBC: 12 10*3/uL — ABNORMAL HIGH (ref 4.0–10.5)

## 2015-03-09 LAB — I-STAT BETA HCG BLOOD, ED (MC, WL, AP ONLY): I-stat hCG, quantitative: <5 m[IU]/mL (ref <5)

## 2015-03-09 LAB — ETHANOL: Alcohol, Ethyl (B): 145 mg/dL — ABNORMAL HIGH (ref <5)

## 2015-03-09 LAB — LIPASE, BLOOD: LIPASE: 28 U/L (ref 11–51)

## 2015-03-09 MED ORDER — PROMETHAZINE HCL 25 MG PO TABS: 25.0000 mg | ORAL_TABLET | Freq: Three times a day (TID) | ORAL | Status: DC | PRN

## 2015-03-09 MED ORDER — LORAZEPAM 1 MG PO TABS: 1.0000 mg | ORAL_TABLET | Freq: Three times a day (TID) | ORAL | Status: DC | PRN

## 2015-03-09 MED ORDER — LORAZEPAM 2 MG/ML IJ SOLN
1.0000 mg | Freq: Once | INTRAMUSCULAR | Status: AC
  Administered 2015-03-09: 1 mg via INTRAVENOUS
  Filled 2015-03-09: qty 1

## 2015-03-09 MED ORDER — POTASSIUM CHLORIDE CRYS ER 20 MEQ PO TBCR
40.0000 meq | EXTENDED_RELEASE_TABLET | Freq: Once | ORAL | Status: AC
  Administered 2015-03-09: 40 meq via ORAL
  Filled 2015-03-09: qty 2

## 2015-03-09 MED ORDER — SUCRALFATE 1 GM/10ML PO SUSP: 1.0000 g | Freq: Three times a day (TID) | ORAL | Status: DC

## 2015-03-09 MED ORDER — SODIUM CHLORIDE 0.9 % IV BOLUS (SEPSIS)
1000.0000 mL | Freq: Once | INTRAVENOUS | Status: AC
  Administered 2015-03-09: 1000 mL via INTRAVENOUS

## 2015-03-09 MED ORDER — RANITIDINE HCL 150 MG PO CAPS: 150.0000 mg | ORAL_CAPSULE | Freq: Two times a day (BID) | ORAL | Status: DC

## 2015-03-09 MED ORDER — IOHEXOL 300 MG/ML  SOLN
100.0000 mL | Freq: Once | INTRAMUSCULAR | Status: AC | PRN
  Administered 2015-03-09: 100 mL via INTRAVENOUS

## 2015-03-09 MED ORDER — ONDANSETRON HCL 4 MG/2ML IJ SOLN
4.0000 mg | Freq: Once | INTRAMUSCULAR | Status: AC | PRN
  Administered 2015-03-09: 4 mg via INTRAVENOUS
  Filled 2015-03-09: qty 2

## 2015-03-09 MED ORDER — ONDANSETRON HCL 4 MG/2ML IJ SOLN
4.0000 mg | Freq: Once | INTRAMUSCULAR | Status: AC
  Administered 2015-03-09: 4 mg via INTRAVENOUS
  Filled 2015-03-09: qty 2

## 2015-03-09 MED ORDER — PANTOPRAZOLE SODIUM 40 MG IV SOLR
40.0000 mg | Freq: Once | INTRAVENOUS | Status: AC
Start: 2015-03-09 — End: 2015-03-09
  Administered 2015-03-09: 40 mg via INTRAVENOUS
  Filled 2015-03-09: qty 40

## 2015-03-09 NOTE — Discharge Instructions (Signed)
Your liver enzymes were slightly elevated today, which can be a sign of liver injury due to alcohol use.  Your CT scan showed an area on your uterus that needs to be rechecked by your family doctor or OBGYN - call for follow up.  Drink only liquids for the next twenty four hours and advance your diet as tolerated after that.     Gastritis, Adult Gastritis is soreness and swelling (inflammation) of the lining of the stomach. Gastritis can develop as a sudden onset (acute) or long-term (chronic) condition. If gastritis is not treated, it can lead to stomach bleeding and ulcers. CAUSES  Gastritis occurs when the stomach lining is weak or damaged. Digestive juices from the stomach then inflame the weakened stomach lining. The stomach lining may be weak or damaged due to viral or bacterial infections. One common bacterial infection is the Helicobacter pylori infection. Gastritis can also result from excessive alcohol consumption, taking certain medicines, or having too much acid in the stomach.  SYMPTOMS  In some cases, there are no symptoms. When symptoms are present, they may include:  Pain or a burning sensation in the upper abdomen.  Nausea.  Vomiting.  An uncomfortable feeling of fullness after eating. DIAGNOSIS  Your caregiver may suspect you have gastritis based on your symptoms and a physical exam. To determine the cause of your gastritis, your caregiver may perform the following:  Blood or stool tests to check for the H pylori bacterium.  Gastroscopy. A thin, flexible tube (endoscope) is passed down the esophagus and into the stomach. The endoscope has a light and camera on the end. Your caregiver uses the endoscope to view the inside of the stomach.  Taking a tissue sample (biopsy) from the stomach to examine under a microscope. TREATMENT  Depending on the cause of your gastritis, medicines may be prescribed. If you have a bacterial infection, such as an H pylori infection, antibiotics  may be given. If your gastritis is caused by too much acid in the stomach, H2 blockers or antacids may be given. Your caregiver may recommend that you stop taking aspirin, ibuprofen, or other nonsteroidal anti-inflammatory drugs (NSAIDs). HOME CARE INSTRUCTIONS  Only take over-the-counter or prescription medicines as directed by your caregiver.  If you were given antibiotic medicines, take them as directed. Finish them even if you start to feel better.  Drink enough fluids to keep your urine clear or pale yellow.  Avoid foods and drinks that make your symptoms worse, such as:  Caffeine or alcoholic drinks.  Chocolate.  Peppermint or mint flavorings.  Garlic and onions.  Spicy foods.  Citrus fruits, such as oranges, lemons, or limes.  Tomato-based foods such as sauce, chili, salsa, and pizza.  Fried and fatty foods.  Eat small, frequent meals instead of large meals. SEEK IMMEDIATE MEDICAL CARE IF:   You have black or dark red stools.  You vomit blood or material that looks like coffee grounds.  You are unable to keep fluids down.  Your abdominal pain gets worse.  You have a fever.  You do not feel better after 1 week.  You have any other questions or concerns. MAKE SURE YOU:  Understand these instructions.  Will watch your condition.  Will get help right away if you are not doing well or get worse.   This information is not intended to replace advice given to you by your health care provider. Make sure you discuss any questions you have with your health care provider.  Document Released: 02/25/2001 Document Revised: 09/02/2011 Document Reviewed: 04/16/2011 Elsevier Interactive Patient Education 2016 [employer]. Alcohol Withdrawal Alcohol withdrawal is a group of symptoms that can develop when a person who drinks heavily and regularly stops drinking or drinks less. CAUSES Heavy and regular drinking can cause chemicals that send signals from the brain to the  body (neurotransmitters) to deactivate. Alcohol withdrawal develops when deactivated neurotransmitters reactivate because a person stops drinking or drinks less. RISK FACTORS The more a person drinks and the longer he or she drinks, the greater the risk of alcohol withdrawal. Severe withdrawal is more likely to develop in someone who:  Had severe alcohol withdrawal in the past.  Had a seizure during a previous episode of alcohol withdrawal.  Is elderly.  Is pregnant.  Has been abusing drugs.  Has other medical problems, including:  Infection.  Heart, lung, or liver disease.  Seizures.  Mental health problems. SYMPTOMS Symptoms of this condition can be mild to moderate, or they can be severe. Mild to moderate symptoms may include:  Fatigue.  Nightmares.  Trouble sleeping.  Depression.  Anxiety.  Inability to think clearly.  Mood swings.  Irritability.  Loss of appetite.  Nausea or vomiting.  Clammy skin.  Extreme sweating.  Rapid heartbeat.  Shakiness.  Uncontrollable shaking (tremor). Severe symptoms may include:  Fever.  Seizures.  Severeconfusion.  Feeling or seeing things that are not there (hallucinations). Symptoms usually begin within eight hours after a person stops drinking or drinks less. They can last for weeks. DIAGNOSIS Alcohol withdrawal is diagnosed with a medical history and physical exam. Sometimes, urine and blood tests are also done. TREATMENT Treatment may involve:  Monitoring blood pressure, pulse, and breathing.  Getting fluids through an IV tube.  Medicine to reduce anxiety.  Medicine to prevent or control seizures.  Multivitamins and B vitamins.  Having a health care provider check on you daily. If symptoms are moderate to severe or if there is a risk of severe withdrawal, treatment may be done at a hospital or treatment center. HOME CARE INSTRUCTIONS  Take medicines and vitamin supplements only as directed  by your health care provider.  Do not drink alcohol.  Have someone stay with you or be available if you need help.  Drink enough fluid to keep your urine clear or pale yellow.  Consider joining a 12-step program or another alcohol support group. SEEK MEDICAL CARE IF:  Your symptoms get worse or do not go away.  You cannot keep food or water in your stomach.  You are struggling with not drinking alcohol.  You cannot stop drinking alcohol. SEEK IMMEDIATE MEDICAL CARE IF:   You have an irregular heartbeat.  You have chest pain.  You have trouble breathing.  You have symptoms of severe withdrawal, such as:  A fever.  Seizures.  Severe confusion.  Hallucinations.   This information is not intended to replace advice given to you by your health care provider. Make sure you discuss any questions you have with your health care provider.   Document Released: 12/11/2004 Document Revised: 03/24/2014 Document Reviewed: 12/20/2013 Elsevier Interactive Patient Education 2016 [employer].  Emergency Department Resource Guide 1) Find a Doctor and Pay Out of Pocket Although you won't have to find out who is covered by your insurance plan, it is a good idea to ask around and get recommendations. You will then need to call the office and see if the doctor you have chosen will accept you as a new  patient and what types of options they offer for patients who are self-pay. Some doctors offer discounts or will set up payment plans for their patients who do not have insurance, but you will need to ask so you aren't surprised when you get to your appointment.  2) Contact Your Local Health Department Not all health departments have doctors that can see patients for sick visits, but many do, so it is worth a call to see if yours does. If you don't know where your local health department is, you can check in your phone book. The CDC also has a tool to help you locate your state's health  department, and many state websites also have listings of all of their local health departments.  3) Find a [facility] If your illness is not likely to be very severe or complicated, you may want to try a walk in clinic. These are popping up all over the country in pharmacies, drugstores, and shopping centers. They're usually staffed by nurse practitioners or physician assistants that have been trained to treat common illnesses and complaints. They're usually fairly quick and inexpensive. However, if you have serious medical issues or chronic medical problems, these are probably not your best option.  No Primary Care Doctor: - Call Health Connect at  [phone] - they can help you locate a primary care doctor that  accepts your insurance, provides certain services, etc. - Physician Referral Service- [phone]  Chronic Pain Problems: Organization         Address  Phone   Notes  [facility]  [phone] Patients need to be referred by their primary care doctor.   Medication Assistance: Organization         Address  Phone   Notes  [facility] Medication [facility] [address] [place], [place] [address] [phone] --Must be a resident of [facility] -- Must have NO insurance coverage whatsoever (no Medicaid/ Medicare, etc.) -- The pt. MUST have a primary care doctor that directs their care regularly and follows them in the community   MedAssist  [phone]   [employer]  [phone]    Agencies that provide inexpensive medical care: Organization         Address  Phone   Notes  [place]  [phone]   [facility]    [phone]   [facility] [place], [place] [address] [phone]   [place] [address] [place] [phone]   Planned Parenthood    [phone]   [facility]    [phone]    [place] and [place], [place] Phone:  [phone], Fax:  [phone] Hours of Operation:  9 am - 6 pm, M-F.  Also accepts Medicaid/Medicare and self-pay.  [facility] for [place], [address], [place] Phone: [phone], Fax: [phone]. Hours of Operation:  8:30 am - 5:30 pm, M-F.  Also accepts Medicaid and self-pay.  [facility] [address] [place] Phone: [phone]   [place], [place], [place] [phone], Ext. [phone] Mondays & Thursdays: 7-9 AM.  First 15 patients are seen on a first come, first serve basis.    [place] Providers:  Organization         Address  Phone   Notes  [facility] [address], [place] [phone] Also accepts self-pay patients.  [facility] [id number] [place], [place]  [phone]   [place], [address], [place] [phone]   [facility] [address] [place] [phone]   Lucianne Lei [address], [place]   [phone] Only accepts [place] Access [place] patients after they have their name applied to their card.   Self-Pay (no insurance) in [facility]:  Organization         Address  Phone   Notes  Sickle Cell Patients, [facility] [place] [phone]   [facility] [place] [phone]   [facility] [place]  [place], [place], [place] [phone]   [facility]/Dr. Osei-Bonsu  [address], [place] or [address], [place] [phone] Phone number for both Circle and [place] locations is the same.  Urgent Medical and [facility] [address] [place] [phone]    [facility] [address], [place] or [address] [phone]   [facility] [address], [place] [phone], phone; [phone], fax Sees patients 1st and 3rd Saturday of every month.  Must not qualify for public or private insurance (i.e. Medicaid, Medicare, [place] Health Choice, Veterans' Benefits)  Household income should be no more than 200% of the poverty level The clinic cannot treat you if you are pregnant or think you are pregnant  Sexually transmitted diseases are not treated at the clinic.    Dental Care: Organization         Address  Phone  Notes  [facility] Department of [facility] [place] [phone] Accepts children up to age 57 who are enrolled in [place] or [place]; pregnant women with a Medicaid card; and children who have applied for Medicaid or [place] Health Choice, but were declined, whose parents can pay a reduced fee at time of service.  [facility] Department of [facility]  [address], [place] [phone] Accepts children up to age 69 who are enrolled in [place] or [place]; pregnant women with a Medicaid card; and children who have applied for Medicaid or [place] Health Choice, but were declined, whose parents can pay a reduced fee at time of service.  [place] Adult Dental Access PROGRAM  [place] [phone] Patients are seen by appointment only. Walk-ins are not accepted. [place] will see patients 69 years of age and older. Monday - Tuesday (8am-5pm) Most Wednesdays (8:30-5pm) $30 per visit, cash only  [facility] Adult Dental Access PROGRAM  [address], [facility] [phone] Patients are seen by appointment only. Walk-ins are not accepted. [place] will see patients 75 years of age and older. One Wednesday Evening (Monthly: Volunteer Based).   $30 per visit, cash only  [place]  [phone] for adults; Children under age 62, call Graduate Pediatric Dentistry at [phone]. Children aged 51-14, please call [phone] to request a pediatric application.  Dental services are provided in all areas of dental care including fillings, crowns  and bridges, complete and partial dentures, implants, gum treatment, root canals, and extractions. Preventive care is also provided. Treatment is provided to both adults and children. Patients are selected via a lottery and there is often a waiting list.   [facility] [address] [place]  [phone] [email or web address]   [facility] [address] [place], [place] [phone], Ext. [phone] Second and Fourth Thursday of each month, opens at 6:30 AM; Clinic ends at 9 AM.  Patients are seen on a first-come first-served basis, and a limited number are seen during each clinic.   [facility]  [address] Hillard Danker [place], [place] [phone]   Eligibility Requirements You must have lived in [place], [place], or [place] counties for at least the last three months.   You cannot be eligible for state or federal sponsored [employer], including [employer], [place], or [employer].   You generally cannot be eligible for healthcare insurance through your employer.    How to apply: Eligibility screenings are held every Tuesday and Wednesday afternoon from 1:00 pm until 4:00 pm. You do not need an appointment for the interview!  [facility] [address] [place], [place]   [place]  [place] Department  [place]  [phone]    Behavioral Health Resources in the Community: Intensive Outpatient Programs Organization         Address  Phone  Notes  [place]. [address] [place], [place] [phone]   [facility] Outpatient [address] [place], [place]   ADS: Alcohol & Drug [facility] [address] [place], [place]   [place] [address],  [place], [place] or [phone]   Substance Abuse Resources Organization         Address  Phone  Notes  Alcohol and Drug Services  [phone]   [place]  [phone]   The [place]   Chinita Pester  [phone]   Residential & Outpatient Substance Abuse Program  [phone]   Psychological Services Organization         Address  Phone  Notes  [facility] [place]  [place]  [phone]   [place] [address] [place] or [phone]    Mobile Crisis Teams Organization         Address  Phone  Notes  [facility], Mobile Crisis Care Unit  [phone]   [facility]ive Psychotherapeutic Services  [address] [place], [place]   [place] [address] [place] [phone]    Self-Help/Support Groups Organization         Address  Phone             Notes  [place]. of [place] - variety of support groups  [place] Call for more information  Narcotics Anonymous (NA), [facility] [address], [employer] [place]  2 meetings at this location   Special educational needs teacher         Address  Phone  Notes  ASAP Residential Treatment [place],    [place]  Retreat  [address] [place], [place], [place]   [place], [place] [phone]  Admissions: 8am-3pm M-F  Incentives Substance [place] [address]    [place], [place] [phone]   [facility] [address] [place], [place], [place]   [facility] [address],  [place], [place]   [facility]ight Programs - Intensive Outpatient [address] Kristeen [address], [place], [place]   [facility] ([place].) [place].,  [place], [place] [phone] or [phone]   [facility]idential Treatment Services (RTS) [address] [place], [place] Accepts Medicaid  [facility] [address]  [place] [phone] Substance Abuse/Addiction Treatment   [facility] Organization         Address  Phone  Notes  [facility]  [phone]   Domenic Schwab, PhD [address] Arlis [place], [place]   [phone] or [phone]   [place] [address] [place], [place] [phone]   [facility] [address] [place], [place] [phone] Insurance/Medicaid/sponsorship through [place] and Families [address]                                    [place], [place] [phone] [place] [address], [place] [phone]    Dr. Adele Schilder  [phone]   [facility] Dept. 1) [address], [place] 2) [place] 3)  [place], [place] [address], [place] [phone]  [phone]   [place] [phone] or [phone] (After Hours)

## 2015-03-09 NOTE — ED Provider Notes (Signed)
Pt visit shared.  Pt with hx/o EtOH abuse here for evaluation of epigastric pain, vomiting.  Pt improved in ED and able to tolerate oral fluids.  Plan to d/c home with medications for alcoholic gastritis.  Providing ativan for EtOH withrdrawl symptoms as well as outpatient detox options.  Discussed close return precautions for recurrent/worsening symptoms.     Quintella Reichert, MD 03/10/15 [phone]

## 2015-03-09 NOTE — ED Notes (Signed)
Pt c/o n/v w/ occasional blood in vomit and no bowel movement x 1 week. Pt reports starting to drink heavily in October. Pt has had GI issues since beginning to drink heavily but worse recently. Pt drinks a fifth a day. Pt last drink 0600 today. Pt tremors, anxious, n/v at this time.

## 2015-03-09 NOTE — ED Notes (Signed)
Wasted 1mg  ativan w/ Christoper Allegra - unable to do so in Pyxis.

## 2015-03-09 NOTE — ED Notes (Signed)
Pt here for abd pain, constipation, N.V.

## 2015-03-09 NOTE — ED Notes (Signed)
Dr. Belfi at bedside 

## 2015-03-09 NOTE — ED Provider Notes (Signed)
CSN: [id number]     Arrival date & time 03/09/15  1229 History   First MD Initiated Contact with Patient 03/09/15 1242     Chief Complaint  Patient presents with  . Abdominal Pain  . Nausea     (Consider location/radiation/quality/duration/timing/severity/associated sxs/prior Treatment) HPI Comments: Patient with a history of hypertension, hyperlipidemia and alcohol abuse presents with abdominal pain and vomiting. She states that since September, she's been drinking more heavily because she lost her job. She drinks throughout the day, usually about a fifth a day. She states over the last month she's had worsening pain to her left abdomen. She states over the last week it's been constant. She has pain across her upper abdomen in the left side. She hasn't had a bowel movement but she feels like it's because she hasn't eaten. She's had ongoing nausea and vomiting. Today and yesterday she noticed a little bit of blood mixed with emesis. She denies any blood in her stool. She is taking omeprazole without improvement of symptoms.  Patient is a 44 y.o. female presenting with abdominal pain.  Abdominal Pain Associated symptoms: nausea and vomiting   Associated symptoms: no chest pain, no chills, no cough, no diarrhea, no fatigue, no fever, no hematuria and no shortness of breath     Past Medical History  Diagnosis Date  . Hypertension   . H/O hiatal hernia    Past Surgical History  Procedure Laterality Date  . Tonsillectomy    . Tubal ligation    . Knee surgery     No family history on file. Social History  Substance Use Topics  . Smoking status: Current Every Day Smoker -- 0.25 packs/day  . Smokeless tobacco: None  . Alcohol Use: 2.4 oz/week    4 Cans of beer per week     Comment: daily   OB History    No data available     Review of Systems  Constitutional: Negative for fever, chills, diaphoresis and fatigue.  HENT: Negative for congestion, rhinorrhea and sneezing.   Eyes:  Negative.   Respiratory: Negative for cough, chest tightness and shortness of breath.   Cardiovascular: Negative for chest pain and leg swelling.  Gastrointestinal: Positive for nausea, vomiting and abdominal pain. Negative for diarrhea and blood in stool.  Genitourinary: Negative for frequency, hematuria, flank pain and difficulty urinating.  Musculoskeletal: Negative for back pain and arthralgias.  Skin: Negative for rash.  Neurological: Negative for dizziness, speech difficulty, weakness, numbness and headaches.  Psychiatric/Behavioral: The patient is nervous/anxious.       Allergies  Review of patient's allergies indicates no known allergies.  Home Medications   Prior to Admission medications   Medication Sig Start Date End Date Taking? Authorizing Provider  amLODipine (NORVASC) 5 MG tablet Take 5 mg by mouth daily.   Yes Historical Provider, MD  aspirin 81 MG chewable tablet Chew 81 mg by mouth daily.   Yes Historical Provider, MD  losartan-hydrochlorothiazide (HYZAAR) 50-12.5 MG tablet Take 1 tablet by mouth daily.   Yes Historical Provider, MD  omeprazole (PRILOSEC) 20 MG capsule Take 20 mg by mouth daily.   Yes Historical Provider, MD  ferrous sulfate 325 (65 FE) MG tablet Take 1 tablet (325 mg total) by mouth daily with breakfast. 08/22/11 08/21/12  Clanford Marisa Hua, MD  lisinopril-hydrochlorothiazide (PRINZIDE,ZESTORETIC) 20-25 MG per tablet Take 1 tablet by mouth daily.    Historical Provider, MD  pravastatin (PRAVACHOL) 20 MG tablet Take 1 tablet (20 mg total) by mouth  at bedtime. For cholesterol 08/22/11 08/21/12  Clanford L Johnson, MD   BP 131/75 mmHg  Pulse 114  Resp 22  SpO2 96%  LMP 02/15/2015 Physical Exam  Constitutional: She is oriented to person, place, and time. She appears well-developed and well-nourished.  HENT:  Head: Normocephalic and atraumatic.  Eyes: Pupils are equal, round, and reactive to light.  Neck: Normal range of motion. Neck supple.   Cardiovascular: Normal rate, regular rhythm and normal heart sounds.   Pulmonary/Chest: Effort normal and breath sounds normal. No respiratory distress. She has no wheezes. She has no rales. She exhibits no tenderness.  Abdominal: Soft. Bowel sounds are normal. There is tenderness (moderate TTP across epigastrium and left upper/mid abdomen). There is no rebound and no guarding.  Musculoskeletal: Normal range of motion. She exhibits no edema.  Lymphadenopathy:    She has no cervical adenopathy.  Neurological: She is alert and oriented to person, place, and time.  Slight tremor  Skin: Skin is warm and dry. No rash noted.  Psychiatric: She has a normal mood and affect.    ED Course  Procedures (including critical care time) Labs Review Labs Reviewed  COMPREHENSIVE METABOLIC PANEL - Abnormal; Notable for the following:    Potassium 2.9 (*)    Chloride 100 (*)    CO2 20 (*)    Glucose, Bld 112 (*)    AST 108 (*)    Total Bilirubin 1.5 (*)    Anion gap 20 (*)    All other components within normal limits  CBC - Abnormal; Notable for the following:    WBC 12.0 (*)    RDW 22.7 (*)    All other components within normal limits  ETHANOL - Abnormal; Notable for the following:    Alcohol, Ethyl (B) 145 (*)    All other components within normal limits  LIPASE, BLOOD  URINALYSIS, ROUTINE W REFLEX MICROSCOPIC (NOT AT [facility])  I-STAT BETA HCG BLOOD, ED ([facility], [facility], [facility] ONLY)    Imaging Review No results found. I have personally reviewed and evaluated these images and lab results as part of my medical decision-making.   EKG Interpretation None      MDM   Final diagnoses:  Gastritis    Pt with hx of ETOH abuse presents with worsening abd pain and vomiting.  Given IVFs, zofran, protonix.  CT pending.  Dr. Ralene Bathe to take over care.    Malvin Johns, MD 03/09/15 [phone]

## 2015-04-04 ENCOUNTER — Telehealth: Payer: Self-pay | Admitting: Family Medicine

## 2015-04-04 ENCOUNTER — Ambulatory Visit (INDEPENDENT_AMBULATORY_CARE_PROVIDER_SITE_OTHER): Payer: Self-pay | Admitting: Family Medicine

## 2015-04-04 ENCOUNTER — Encounter: Payer: Self-pay | Admitting: Family Medicine

## 2015-04-04 VITALS — BP 162/96 | HR 89 | Temp 98.6°F | Resp 16 | Ht 65.5 in | Wt 177.0 lb

## 2015-04-04 DIAGNOSIS — F1011 Alcohol abuse, in remission: Secondary | ICD-10-CM

## 2015-04-04 DIAGNOSIS — F1010 Alcohol abuse, uncomplicated: Secondary | ICD-10-CM

## 2015-04-04 DIAGNOSIS — D259 Leiomyoma of uterus, unspecified: Secondary | ICD-10-CM

## 2015-04-04 DIAGNOSIS — G629 Polyneuropathy, unspecified: Secondary | ICD-10-CM

## 2015-04-04 DIAGNOSIS — R634 Abnormal weight loss: Secondary | ICD-10-CM

## 2015-04-04 DIAGNOSIS — Z720 Tobacco use: Secondary | ICD-10-CM

## 2015-04-04 DIAGNOSIS — I10 Essential (primary) hypertension: Secondary | ICD-10-CM

## 2015-04-04 DIAGNOSIS — E785 Hyperlipidemia, unspecified: Secondary | ICD-10-CM

## 2015-04-04 LAB — POCT URINALYSIS DIP (DEVICE)
BILIRUBIN URINE: NEGATIVE
GLUCOSE, UA: NEGATIVE mg/dL
Hgb urine dipstick: NEGATIVE
KETONES UR: NEGATIVE mg/dL
LEUKOCYTES UA: NEGATIVE
Nitrite: NEGATIVE
Protein, ur: NEGATIVE mg/dL
Urobilinogen, UA: 0.2 mg/dL (ref 0.0–1.0)
pH: 5.5 (ref 5.0–8.0)

## 2015-04-04 MED ORDER — GABAPENTIN 100 MG PO CAPS: 100.0000 mg | ORAL_CAPSULE | Freq: Three times a day (TID) | ORAL | Status: DC

## 2015-04-04 MED ORDER — AMLODIPINE BESYLATE 10 MG PO TABS: 5.0000 mg | ORAL_TABLET | Freq: Every day | ORAL | Status: DC

## 2015-04-04 MED FILL — AMLODIPINE BESYLATE 10 MG T: 10 | 30 days supply | Qty: 15 | Fill #0

## 2015-04-04 MED FILL — GABAPENTIN 100 MG CAPSULE: 100 | 30 days supply | Qty: 90 | Fill #0

## 2015-04-04 NOTE — Telephone Encounter (Signed)
Opened in error

## 2015-04-04 NOTE — Patient Instructions (Addendum)
Follow up with [facility] [place], [place]. Have a walk-in clinic on M-F 8-3 Will follow up in 1 month after starting trial of Gabapentin 100 TID for neuropathy Will continue Amlodipine 10 mg daily for hypertension Will send a referral for a screening mammogram Will schedule a pelvic/transvaginal ultrasound for possible fibroidsPeripheral Neuropathy Peripheral neuropathy is a type of nerve damage. It affects nerves that carry signals between the spinal cord and other parts of the body. These are called peripheral nerves. With peripheral neuropathy, one nerve or a group of nerves may be damaged.  CAUSES  Many things can damage peripheral nerves. For some people with peripheral neuropathy, the cause is unknown. Some causes include:  Diabetes. This is the most common cause of peripheral neuropathy.  Injury to a nerve.  Pressure or stress on a nerve that lasts a long time.  Too little vitamin B. Alcoholism can lead to this.  Infections.  Autoimmune diseases, such as multiple sclerosis and systemic lupus erythematosus.  Inherited nerve diseases.  Some medicines, such as cancer drugs.  Toxic substances, such as lead and mercury.  Too little blood flowing to the legs.  Kidney disease.  Thyroid disease. SIGNS AND SYMPTOMS  Different people have different symptoms. The symptoms you have will depend on which of your nerves is damaged. Common symptoms include:  Loss of feeling (numbness) in the feet and hands.  Tingling in the feet and hands.  Pain that burns.  Very sensitive skin.  Weakness.  Not being able to move a part of the body (paralysis).  Muscle twitching.  Clumsiness or poor coordination.  Loss of balance.  Not being able to control your bladder.  Feeling dizzy.  Sexual problems. DIAGNOSIS  Peripheral neuropathy is a symptom, not a disease. Finding the cause of peripheral neuropathy can be hard. To figure that out, your  health care provider will take a medical history and do a physical exam. A neurological exam will also be done. This involves checking things affected by your brain, spinal cord, and nerves (nervous system). For example, your health care provider will check your reflexes, how you move, and what you can feel.  Other types of tests may also be ordered, such as:  Blood tests.  A test of the fluid in your spinal cord.  Imaging tests, such as CT scans or an MRI.  Electromyography (EMG). This test checks the nerves that control muscles.  Nerve conduction velocity tests. These tests check how fast messages pass through your nerves.  Nerve biopsy. A small piece of nerve is removed. It is then checked under a microscope. TREATMENT   Medicine is often used to treat peripheral neuropathy. Medicines may include:  Pain-relieving medicines. Prescription or over-the-counter medicine may be suggested.  Antiseizure medicine. This may be used for pain.  Antidepressants. These also may help ease pain from neuropathy.  Lidocaine. This is a numbing medicine. You might wear a patch or be given a shot.  Mexiletine. This medicine is typically used to help control irregular heart rhythms.  Surgery. Surgery may be needed to relieve pressure on a nerve or to destroy a nerve that is causing pain.  Physical therapy to help movement.  Assistive devices to help movement. HOME CARE INSTRUCTIONS   Only take over-the-counter or prescription medicines as directed by your health care provider. Follow the instructions carefully for any given medicines. Do not take any other medicines without first getting approval from your health care provider.  If  you have diabetes, work closely with your health care provider to keep your blood sugar under control.  If you have numbness in your feet:  Check every day for signs of injury or infection. Watch for redness, warmth, and swelling.  Wear padded socks and comfortable  shoes. These help protect your feet.  Do not do things that put pressure on your damaged nerve.  Do not smoke. Smoking keeps blood from getting to damaged nerves.  Avoid or limit alcohol. Too much alcohol can cause a lack of B vitamins. These vitamins are needed for healthy nerves.  Develop a good support system. Coping with peripheral neuropathy can be stressful. Talk to a mental health specialist or join a support group if you are struggling.  Follow up with your health care provider as directed. SEEK MEDICAL CARE IF:   You have new signs or symptoms of peripheral neuropathy.  You are struggling emotionally from dealing with peripheral neuropathy.  You have a fever. SEEK IMMEDIATE MEDICAL CARE IF:   You have an injury or infection that is not healing.  You feel very dizzy or begin vomiting.  You have chest pain.  You have trouble breathing.   This information is not intended to replace advice given to you by your health care provider. Make sure you discuss any questions you have with your health care provider.   Document Released: 02/21/2002 Document Revised: 11/13/2010 Document Reviewed: 11/08/2012 Elsevier Interactive Patient Education [employer].

## 2015-04-04 NOTE — Progress Notes (Signed)
Subjective:    Patient ID: Sharon Hunt, female    DOB: January 12, 1971, 45 y.o.   MRN: [id number]  HPI  Sharon Hunt, a 45 year old female presents to establish care. Patient maintains that she has not had a primary provider due to lack of payer source. She reports a history of a hiatal hernia. She was under the care of Dr. Watt Climes, gastroenterologist. She maintains that she has not been followed over the past several years. Patient was recently seen in the emergency department on 03/09/2015 for an evaluation of epigastric pain and vomiting. She reported a history of alcohol abuse at the time. She states that she was drinking a 5th of vodka daily. She maintains that she has not had alcohol since March 17, 2015. She was prescribed thiamine, folic acid, and lorazepam to assist with alcohol withdrawal .  She is complaining of numbness and tingling to upper and lower extremities. She maintains that she occasionally has burning/shooting pain to fingertips. She denies fever, fatigue, irritability, visual/auditory hallucinations, nausea, vomiting, or diarrhea.  Patient also presents for a f/u of hypertension. She has not been taking medications consistently. She does not check blood pressures at home.. She is not exercising and is not adherent to low salt diet.  Patient denies dyspnea, fatigue, irregular heart beat, orthopnea, palpitations and tachypnea.  Cardiovascular risk factors include:  sedentary lifestyle and smoking/ tobacco exposure.   Past Medical History  Diagnosis Date  . Hypertension   . H/O hiatal hernia    Social History   Social History  . Marital Status: Single    Spouse Name: N/A  . Number of Children: N/A  . Years of Education: N/A   Occupational History  . Not on file.   Social History Main Topics  . Smoking status: Current Every Day Smoker -- 1.00 packs/day    Types: Cigarettes  . Smokeless tobacco: Not on file  . Alcohol Use: No     Comment: y  . Drug Use: No   . Sexual Activity: No   Other Topics Concern  . Not on file   Social History Narrative   Review of Systems  Constitutional: Negative.  Negative for fever, fatigue and unexpected weight change.  HENT: Negative.  Negative for dental problem.   Eyes: Negative.   Respiratory: Negative.   Cardiovascular: Negative.   Gastrointestinal: Negative.   Endocrine: Negative.   Genitourinary: Negative.   Musculoskeletal: Positive for myalgias.  Skin: Negative.   Allergic/Immunologic: Negative.   Neurological: Positive for tremors and numbness. Negative for dizziness and headaches.  Hematological: Negative.   Psychiatric/Behavioral: Negative.  Negative for suicidal ideas and sleep disturbance.      Objective:   Physical Exam  Constitutional: She is oriented to person, place, and time. She appears well-developed and well-nourished.  HENT:  Head: Normocephalic and atraumatic.  Right Ear: External ear normal.  Left Ear: External ear normal.  Nose: Nose normal.  Mouth/Throat: Oropharynx is clear and moist.  Eyes: Conjunctivae and EOM are normal. Pupils are equal, round, and reactive to light.  Cardiovascular: Normal rate, regular rhythm, normal heart sounds and intact distal pulses.   Pulmonary/Chest: Effort normal and breath sounds normal.  Abdominal: Soft.  Musculoskeletal: Normal range of motion.  Neurological: She is alert and oriented to person, place, and time. She has normal reflexes.  Skin: Skin is warm and dry.  Psychiatric: She has a normal mood and affect. Her behavior is normal. Judgment and thought content normal.  BP 162/96 mmHg  Pulse 89  Temp(Src) 98.6 F (37 C) (Oral)  Resp 16  Ht 5' 5.5" (1.664 m)  Wt 177 lb (80.287 kg)  BMI 29.00 kg/m2  LMP 02/15/2015 Assessment & Plan:   1. Essential hypertension Blood pressure is above goal. Patient has been without medication over the past several months. Will start Amlodipine 5 mg daily. Recommend starting DASH diet  plan. Will follow up for hypertension in 1 month.  - CBC with Differential - COMPLETE METABOLIC PANEL WITH GFR - amLODipine (NORVASC) 10 MG tablet; Take 0.5 tablets (5 mg total) by mouth daily.  Dispense: 30 tablet; Refill: 0  2. Hyperlipidemia  - Lipid Panel  3. Neuropathy ([place]) I suspect that neuropathy is r/t past alcohol use. Will start a trial of gabapentin.  - gabapentin (NEURONTIN) 100 MG capsule; Take 1 capsule (100 mg total) by mouth 3 (three) times daily.  Dispense: 90 capsule; Refill: 0  4. History of alcohol abuse Recommend that patient continue folic acid and thiamine.  - Ethanol  5. Loss of weight  - Hemoglobin A1c - TSH  6. Leiomyoma of uterus, unspecified Patient has a history of fibroids, she was last seen by Dr. Baltazar Najjar in 2014. She states that bleeding is sporadic and painful. Will order a pelvic ultrasound. Patient may warrant a referral to gynecology.  - US Pelvis Complete; Future - US Transvaginal Non-OB; Future  7. Tobacco abuse Smoking cessation instruction/counseling given:  counseled patient on the dangers of tobacco use, advised patient to stop smoking, and reviewed strategies to maximize success    Routine Health Maintenance:  Recommend screening mammogram Recommend pap smear Recommend balanced diet Recommend EKG Continue refraining from alcohol use The patient was given clear instructions to go to ER or return to medical center if symptoms do not improve, worsen or new problems develop. The patient verbalized understanding. Will notify patient with laboratory results.  Dorena Dew, FNP

## 2015-04-05 ENCOUNTER — Other Ambulatory Visit: Payer: Self-pay | Admitting: Family Medicine

## 2015-04-05 DIAGNOSIS — E785 Hyperlipidemia, unspecified: Secondary | ICD-10-CM

## 2015-04-05 LAB — CBC WITH DIFFERENTIAL/PLATELET
Basophils Absolute: 0 10*3/uL (ref 0.0–0.1)
Basophils Relative: 0 % (ref 0–1)
EOS PCT: 2 % (ref 0–5)
Eosinophils Absolute: 0.3 10*3/uL (ref 0.0–0.7)
HEMATOCRIT: 38.3 % (ref 36.0–46.0)
Hemoglobin: 13 g/dL (ref 12.0–15.0)
LYMPHS ABS: 3.9 10*3/uL (ref 0.7–4.0)
LYMPHS PCT: 27 % (ref 12–46)
MCH: 29.5 pg (ref 26.0–34.0)
MCHC: 33.9 g/dL (ref 30.0–36.0)
MCV: 87 fL (ref 78.0–100.0)
MONO ABS: 0.7 10*3/uL (ref 0.1–1.0)
MPV: 9.5 fL (ref 8.6–12.4)
Monocytes Relative: 5 % (ref 3–12)
Neutro Abs: 9.6 10*3/uL — ABNORMAL HIGH (ref 1.7–7.7)
Neutrophils Relative %: 66 % (ref 43–77)
Platelets: 403 10*3/uL — ABNORMAL HIGH (ref 150–400)
RBC: 4.4 MIL/uL (ref 3.87–5.11)
RDW: 21.7 % — AB (ref 11.5–15.5)
WBC: 14.5 10*3/uL — AB (ref 4.0–10.5)

## 2015-04-05 LAB — LIPID PANEL
CHOL/HDL RATIO: 5.6 ratio — AB (ref <=5.0)
Cholesterol: 219 mg/dL — ABNORMAL HIGH (ref 125–200)
HDL: 39 mg/dL — AB (ref >=46)
LDL CALC: 149 mg/dL — AB (ref <130)
Triglycerides: 154 mg/dL — ABNORMAL HIGH (ref <150)
VLDL: 31 mg/dL — AB (ref <30)

## 2015-04-05 LAB — COMPLETE METABOLIC PANEL WITH GFR
ALK PHOS: 76 U/L (ref 33–115)
ALT: 26 U/L (ref 6–29)
AST: 31 U/L (ref 10–35)
Albumin: 4.1 g/dL (ref 3.6–5.1)
BILIRUBIN TOTAL: 0.5 mg/dL (ref 0.2–1.2)
BUN: 8 mg/dL (ref 7–25)
CALCIUM: 9.7 mg/dL (ref 8.6–10.2)
CHLORIDE: 108 mmol/L (ref 98–110)
CO2: 23 mmol/L (ref 20–31)
Creat: 0.75 mg/dL (ref 0.50–1.10)
Glucose, Bld: 86 mg/dL (ref 65–99)
POTASSIUM: 4.6 mmol/L (ref 3.5–5.3)
Sodium: 138 mmol/L (ref 135–146)
Total Protein: 6.8 g/dL (ref 6.1–8.1)

## 2015-04-05 LAB — TSH: TSH: 2.778 u[IU]/mL (ref 0.350–4.500)

## 2015-04-05 LAB — HEMOGLOBIN A1C
HEMOGLOBIN A1C: 5.6 % (ref <5.7)
Mean Plasma Glucose: 114 mg/dL (ref <117)

## 2015-04-05 LAB — ETHANOL

## 2015-04-05 MED ORDER — ATORVASTATIN CALCIUM 20 MG PO TABS: 20.0000 mg | ORAL_TABLET | Freq: Every day | ORAL | Status: DC

## 2015-04-05 NOTE — Progress Notes (Signed)
Meds ordered this encounter  Medications  . atorvastatin (LIPITOR) 20 MG tablet    Sig: Take 1 tablet (20 mg total) by mouth daily.    Dispense:  90 tablet    Refill:  1    Paisyn Guercio M, FNP

## 2015-04-06 NOTE — Progress Notes (Signed)
Spoke with patient advised of lab results and to start lipitor 20mg  as directed and to eat low fat/ low carb diet over 5 to 6 small meals daily. Drink 6 to 8 glasses daily and exercise 150 minutes weekly of cardio exercise. Thanks !

## 2015-04-06 NOTE — Progress Notes (Signed)
Left message for patient to return call regarding labs. Thanks!  

## 2015-04-13 ENCOUNTER — Ambulatory Visit (HOSPITAL_COMMUNITY)
Admission: RE | Admit: 2015-04-13 | Discharge: 2015-04-13 | Disposition: A | Discharge status: Home / Self Care | Payer: Medicaid Other | Source: Ambulatory Visit | Attending: Family Medicine | Admitting: Family Medicine

## 2015-04-13 DIAGNOSIS — D259 Leiomyoma of uterus, unspecified: Secondary | ICD-10-CM | POA: Diagnosis not present

## 2015-04-16 ENCOUNTER — Telehealth: Payer: Self-pay

## 2015-04-16 NOTE — Telephone Encounter (Signed)
-----   Message from Dorena Dew, [place] sent at 04/15/2015  1:33 PM EST ----- Regarding: ultrasound Reviewed pelvic ultrasound. Showed fibroids, will send referral to [facility] for further evaluation. Please follow up in office as needed   Thanks.  ----- Message -----    From: Rad Results In Interface    Sent: 04/13/2015   2:58 PM      To: Dorena Dew, FNP

## 2015-04-16 NOTE — Telephone Encounter (Signed)
Called, patient was unavailable at this time. Son answered phone and will have her call back. Thanks!

## 2015-04-18 NOTE — Telephone Encounter (Signed)
Left message for patient to return call, Thanks!

## 2015-05-09 ENCOUNTER — Ambulatory Visit: Payer: Self-pay | Admitting: Family Medicine

## 2015-11-05 ENCOUNTER — Ambulatory Visit: Payer: Self-pay | Admitting: Obstetrics and Gynecology

## 2015-11-21 ENCOUNTER — Ambulatory Visit: Payer: Self-pay | Admitting: Obstetrics and Gynecology

## 2019-01-05 ENCOUNTER — Inpatient Hospital Stay (HOSPITAL_COMMUNITY)
Admission: EM | Admit: 2019-01-05 | Discharge: 2019-01-07 | DRG: 683 | Disposition: A | Discharge status: Home Health | Payer: Commercial Managed Care - PPO | Attending: Internal Medicine | Admitting: Internal Medicine

## 2019-01-05 ENCOUNTER — Emergency Department (HOSPITAL_COMMUNITY): Payer: Commercial Managed Care - PPO

## 2019-01-05 ENCOUNTER — Other Ambulatory Visit: Payer: Self-pay

## 2019-01-05 ENCOUNTER — Encounter (HOSPITAL_COMMUNITY): Payer: Self-pay | Admitting: Emergency Medicine

## 2019-01-05 DIAGNOSIS — G63 Polyneuropathy in diseases classified elsewhere: Secondary | ICD-10-CM | POA: Diagnosis present

## 2019-01-05 DIAGNOSIS — R0609 Other forms of dyspnea: Secondary | ICD-10-CM | POA: Diagnosis present

## 2019-01-05 DIAGNOSIS — E538 Deficiency of other specified B group vitamins: Secondary | ICD-10-CM | POA: Diagnosis present

## 2019-01-05 DIAGNOSIS — F1010 Alcohol abuse, uncomplicated: Secondary | ICD-10-CM | POA: Diagnosis present

## 2019-01-05 DIAGNOSIS — R0600 Dyspnea, unspecified: Secondary | ICD-10-CM | POA: Diagnosis present

## 2019-01-05 DIAGNOSIS — E872 Acidosis, unspecified: Secondary | ICD-10-CM | POA: Diagnosis present

## 2019-01-05 DIAGNOSIS — E876 Hypokalemia: Secondary | ICD-10-CM | POA: Diagnosis present

## 2019-01-05 DIAGNOSIS — R072 Precordial pain: Secondary | ICD-10-CM

## 2019-01-05 DIAGNOSIS — I10 Essential (primary) hypertension: Secondary | ICD-10-CM | POA: Diagnosis present

## 2019-01-05 DIAGNOSIS — Z20828 Contact with and (suspected) exposure to other viral communicable diseases: Secondary | ICD-10-CM | POA: Diagnosis present

## 2019-01-05 DIAGNOSIS — I951 Orthostatic hypotension: Secondary | ICD-10-CM | POA: Diagnosis present

## 2019-01-05 DIAGNOSIS — D72829 Elevated white blood cell count, unspecified: Secondary | ICD-10-CM | POA: Diagnosis present

## 2019-01-05 DIAGNOSIS — E785 Hyperlipidemia, unspecified: Secondary | ICD-10-CM | POA: Diagnosis present

## 2019-01-05 DIAGNOSIS — R059 Cough, unspecified: Secondary | ICD-10-CM

## 2019-01-05 DIAGNOSIS — Z79899 Other long term (current) drug therapy: Secondary | ICD-10-CM

## 2019-01-05 DIAGNOSIS — R9431 Abnormal electrocardiogram [ECG] [EKG]: Secondary | ICD-10-CM | POA: Diagnosis present

## 2019-01-05 DIAGNOSIS — K828 Other specified diseases of gallbladder: Secondary | ICD-10-CM | POA: Diagnosis present

## 2019-01-05 DIAGNOSIS — E860 Dehydration: Secondary | ICD-10-CM | POA: Diagnosis present

## 2019-01-05 DIAGNOSIS — Z9851 Tubal ligation status: Secondary | ICD-10-CM

## 2019-01-05 DIAGNOSIS — F17210 Nicotine dependence, cigarettes, uncomplicated: Secondary | ICD-10-CM | POA: Diagnosis present

## 2019-01-05 DIAGNOSIS — R05 Cough: Secondary | ICD-10-CM

## 2019-01-05 DIAGNOSIS — Z9089 Acquired absence of other organs: Secondary | ICD-10-CM

## 2019-01-05 DIAGNOSIS — R42 Dizziness and giddiness: Secondary | ICD-10-CM | POA: Diagnosis not present

## 2019-01-05 DIAGNOSIS — Z7982 Long term (current) use of aspirin: Secondary | ICD-10-CM

## 2019-01-05 DIAGNOSIS — N179 Acute kidney failure, unspecified: Secondary | ICD-10-CM | POA: Diagnosis present

## 2019-01-05 DIAGNOSIS — I4581 Long QT syndrome: Secondary | ICD-10-CM | POA: Diagnosis present

## 2019-01-05 DIAGNOSIS — R7989 Other specified abnormal findings of blood chemistry: Secondary | ICD-10-CM

## 2019-01-05 LAB — CBC
HCT: 38.4 % (ref 36.0–46.0)
Hemoglobin: 13.1 g/dL (ref 12.0–15.0)
MCH: 26.4 pg (ref 26.0–34.0)
MCHC: 34.1 g/dL (ref 30.0–36.0)
MCV: 77.4 fL — ABNORMAL LOW (ref 80.0–100.0)
Platelets: 366 10*3/uL (ref 150–400)
RBC: 4.96 MIL/uL (ref 3.87–5.11)
RDW: 24.4 % — ABNORMAL HIGH (ref 11.5–15.5)
WBC: 17.2 10*3/uL — ABNORMAL HIGH (ref 4.0–10.5)
nRBC: 0.2 % (ref 0.0–0.2)

## 2019-01-05 LAB — BASIC METABOLIC PANEL
Anion gap: 20 — ABNORMAL HIGH (ref 5–15)
BUN: 13 mg/dL (ref 6–20)
CO2: 31 mmol/L (ref 22–32)
Calcium: 9.4 mg/dL (ref 8.9–10.3)
Chloride: 85 mmol/L — ABNORMAL LOW (ref 98–111)
Creatinine, Ser: 1.63 mg/dL — ABNORMAL HIGH (ref 0.44–1.00)
GFR calc Af Amer: 42 mL/min — ABNORMAL LOW (ref >60)
GFR calc non Af Amer: 37 mL/min — ABNORMAL LOW (ref >60)
Glucose, Bld: 109 mg/dL — ABNORMAL HIGH (ref 70–99)
Potassium: 2.1 mmol/L — CL (ref 3.5–5.1)
Sodium: 136 mmol/L (ref 135–145)

## 2019-01-05 LAB — I-STAT BETA HCG BLOOD, ED (MC, WL, AP ONLY): I-stat hCG, quantitative: <5 m[IU]/mL (ref <5)

## 2019-01-05 LAB — PROTIME-INR
INR: 1.1 (ref 0.8–1.2)
Prothrombin Time: 13.6 seconds (ref 11.4–15.2)

## 2019-01-05 LAB — TROPONIN I (HIGH SENSITIVITY): Troponin I (High Sensitivity): 69 ng/L — ABNORMAL HIGH (ref <18)

## 2019-01-05 MED ORDER — SODIUM CHLORIDE 0.9% FLUSH
3.0000 mL | Freq: Once | INTRAVENOUS | Status: AC
  Administered 2019-01-06: 3 mL via INTRAVENOUS

## 2019-01-05 NOTE — ED Triage Notes (Signed)
Patient presents with multiple complaints : chest pain for 2 days with SOB , dry cough , fatigue , left leg shaking and dizziness this week .

## 2019-01-06 ENCOUNTER — Observation Stay (HOSPITAL_COMMUNITY): Payer: Commercial Managed Care - PPO

## 2019-01-06 ENCOUNTER — Encounter (HOSPITAL_COMMUNITY): Payer: Self-pay | Admitting: Internal Medicine

## 2019-01-06 DIAGNOSIS — R0609 Other forms of dyspnea: Secondary | ICD-10-CM | POA: Diagnosis present

## 2019-01-06 DIAGNOSIS — I4581 Long QT syndrome: Secondary | ICD-10-CM | POA: Diagnosis present

## 2019-01-06 DIAGNOSIS — N179 Acute kidney failure, unspecified: Secondary | ICD-10-CM | POA: Diagnosis present

## 2019-01-06 DIAGNOSIS — Z79899 Other long term (current) drug therapy: Secondary | ICD-10-CM | POA: Diagnosis not present

## 2019-01-06 DIAGNOSIS — E860 Dehydration: Secondary | ICD-10-CM | POA: Diagnosis present

## 2019-01-06 DIAGNOSIS — I10 Essential (primary) hypertension: Secondary | ICD-10-CM | POA: Diagnosis present

## 2019-01-06 DIAGNOSIS — Z7982 Long term (current) use of aspirin: Secondary | ICD-10-CM | POA: Diagnosis not present

## 2019-01-06 DIAGNOSIS — R0600 Dyspnea, unspecified: Secondary | ICD-10-CM | POA: Diagnosis present

## 2019-01-06 DIAGNOSIS — K828 Other specified diseases of gallbladder: Secondary | ICD-10-CM | POA: Diagnosis present

## 2019-01-06 DIAGNOSIS — D72829 Elevated white blood cell count, unspecified: Secondary | ICD-10-CM | POA: Diagnosis present

## 2019-01-06 DIAGNOSIS — R9431 Abnormal electrocardiogram [ECG] [EKG]: Secondary | ICD-10-CM | POA: Diagnosis present

## 2019-01-06 DIAGNOSIS — F17210 Nicotine dependence, cigarettes, uncomplicated: Secondary | ICD-10-CM | POA: Diagnosis present

## 2019-01-06 DIAGNOSIS — I951 Orthostatic hypotension: Secondary | ICD-10-CM | POA: Diagnosis present

## 2019-01-06 DIAGNOSIS — E872 Acidosis, unspecified: Secondary | ICD-10-CM | POA: Diagnosis present

## 2019-01-06 DIAGNOSIS — Z9089 Acquired absence of other organs: Secondary | ICD-10-CM | POA: Diagnosis not present

## 2019-01-06 DIAGNOSIS — Z20828 Contact with and (suspected) exposure to other viral communicable diseases: Secondary | ICD-10-CM | POA: Diagnosis present

## 2019-01-06 DIAGNOSIS — E785 Hyperlipidemia, unspecified: Secondary | ICD-10-CM | POA: Diagnosis present

## 2019-01-06 DIAGNOSIS — R0602 Shortness of breath: Secondary | ICD-10-CM | POA: Diagnosis not present

## 2019-01-06 DIAGNOSIS — F1010 Alcohol abuse, uncomplicated: Secondary | ICD-10-CM | POA: Diagnosis present

## 2019-01-06 DIAGNOSIS — Z9851 Tubal ligation status: Secondary | ICD-10-CM | POA: Diagnosis not present

## 2019-01-06 DIAGNOSIS — E876 Hypokalemia: Secondary | ICD-10-CM | POA: Diagnosis present

## 2019-01-06 DIAGNOSIS — E538 Deficiency of other specified B group vitamins: Secondary | ICD-10-CM | POA: Diagnosis present

## 2019-01-06 DIAGNOSIS — R42 Dizziness and giddiness: Secondary | ICD-10-CM | POA: Diagnosis present

## 2019-01-06 DIAGNOSIS — G63 Polyneuropathy in diseases classified elsewhere: Secondary | ICD-10-CM | POA: Diagnosis present

## 2019-01-06 LAB — URINALYSIS, ROUTINE W REFLEX MICROSCOPIC
Bilirubin Urine: NEGATIVE
Glucose, UA: NEGATIVE mg/dL
Hgb urine dipstick: NEGATIVE
Ketones, ur: NEGATIVE mg/dL
Nitrite: NEGATIVE
Protein, ur: NEGATIVE mg/dL
Specific Gravity, Urine: 1.021 (ref 1.005–1.030)
pH: 5 (ref 5.0–8.0)

## 2019-01-06 LAB — HEPATIC FUNCTION PANEL
ALT: 29 U/L (ref 0–44)
AST: 82 U/L — ABNORMAL HIGH (ref 15–41)
Albumin: 3.4 g/dL — ABNORMAL LOW (ref 3.5–5.0)
Alkaline Phosphatase: 94 U/L (ref 38–126)
Bilirubin, Direct: 0.5 mg/dL — ABNORMAL HIGH (ref 0.0–0.2)
Indirect Bilirubin: 1.4 mg/dL — ABNORMAL HIGH (ref 0.3–0.9)
Total Bilirubin: 1.9 mg/dL — ABNORMAL HIGH (ref 0.3–1.2)
Total Protein: 7 g/dL (ref 6.5–8.1)

## 2019-01-06 LAB — CBC
HCT: 33.7 % — ABNORMAL LOW (ref 36.0–46.0)
Hemoglobin: 11.5 g/dL — ABNORMAL LOW (ref 12.0–15.0)
MCH: 26.4 pg (ref 26.0–34.0)
MCHC: 34.1 g/dL (ref 30.0–36.0)
MCV: 77.5 fL — ABNORMAL LOW (ref 80.0–100.0)
Platelets: 310 10*3/uL (ref 150–400)
RBC: 4.35 MIL/uL (ref 3.87–5.11)
RDW: 24.3 % — ABNORMAL HIGH (ref 11.5–15.5)
WBC: 17.9 10*3/uL — ABNORMAL HIGH (ref 4.0–10.5)
nRBC: 0 % (ref 0.0–0.2)

## 2019-01-06 LAB — ETHANOL: Alcohol, Ethyl (B): <10 mg/dL (ref <10)

## 2019-01-06 LAB — BASIC METABOLIC PANEL
Anion gap: 12 (ref 5–15)
BUN: 11 mg/dL (ref 6–20)
CO2: 29 mmol/L (ref 22–32)
Calcium: 8.5 mg/dL — ABNORMAL LOW (ref 8.9–10.3)
Chloride: 98 mmol/L (ref 98–111)
Creatinine, Ser: 1.11 mg/dL — ABNORMAL HIGH (ref 0.44–1.00)
GFR calc Af Amer: >60 mL/min (ref >60)
GFR calc non Af Amer: 58 mL/min — ABNORMAL LOW (ref >60)
Glucose, Bld: 103 mg/dL — ABNORMAL HIGH (ref 70–99)
Potassium: 3.7 mmol/L (ref 3.5–5.1)
Sodium: 139 mmol/L (ref 135–145)

## 2019-01-06 LAB — ECHOCARDIOGRAM COMPLETE
Height: 65 in
Weight: 2962.98 oz

## 2019-01-06 LAB — HIV ANTIBODY (ROUTINE TESTING W REFLEX): HIV Screen 4th Generation wRfx: NONREACTIVE

## 2019-01-06 LAB — COMPREHENSIVE METABOLIC PANEL
ALT: 26 U/L (ref 0–44)
AST: 69 U/L — ABNORMAL HIGH (ref 15–41)
Albumin: 3.1 g/dL — ABNORMAL LOW (ref 3.5–5.0)
Alkaline Phosphatase: 87 U/L (ref 38–126)
Anion gap: 15 (ref 5–15)
BUN: 14 mg/dL (ref 6–20)
CO2: 33 mmol/L — ABNORMAL HIGH (ref 22–32)
Calcium: 8.6 mg/dL — ABNORMAL LOW (ref 8.9–10.3)
Chloride: 87 mmol/L — ABNORMAL LOW (ref 98–111)
Creatinine, Ser: 1.22 mg/dL — ABNORMAL HIGH (ref 0.44–1.00)
GFR calc Af Amer: >60 mL/min (ref >60)
GFR calc non Af Amer: 52 mL/min — ABNORMAL LOW (ref >60)
Glucose, Bld: 110 mg/dL — ABNORMAL HIGH (ref 70–99)
Potassium: 2.3 mmol/L — CL (ref 3.5–5.1)
Sodium: 135 mmol/L (ref 135–145)
Total Bilirubin: 1.5 mg/dL — ABNORMAL HIGH (ref 0.3–1.2)
Total Protein: 6.3 g/dL — ABNORMAL LOW (ref 6.5–8.1)

## 2019-01-06 LAB — VITAMIN B12: Vitamin B-12: 365 pg/mL (ref 180–914)

## 2019-01-06 LAB — MAGNESIUM
Magnesium: 1.6 mg/dL — ABNORMAL LOW (ref 1.7–2.4)
Magnesium: 2.3 mg/dL (ref 1.7–2.4)

## 2019-01-06 LAB — SALICYLATE LEVEL: Salicylate Lvl: <7 mg/dL (ref 2.8–30.0)

## 2019-01-06 LAB — SARS CORONAVIRUS 2 (TAT 6-24 HRS): SARS Coronavirus 2: NEGATIVE

## 2019-01-06 LAB — TROPONIN I (HIGH SENSITIVITY): Troponin I (High Sensitivity): 49 ng/L — ABNORMAL HIGH (ref <18)

## 2019-01-06 LAB — ACETAMINOPHEN LEVEL: Acetaminophen (Tylenol), Serum: <10 ug/mL — ABNORMAL LOW (ref 10–30)

## 2019-01-06 MED ORDER — POTASSIUM CHLORIDE 10 MEQ/100ML IV SOLN
10.0000 meq | Freq: Once | INTRAVENOUS | Status: AC
  Administered 2019-01-06: 10 meq via INTRAVENOUS
  Filled 2019-01-06: qty 100

## 2019-01-06 MED ORDER — SODIUM CHLORIDE 0.9 % IV BOLUS (SEPSIS)
1000.0000 mL | Freq: Once | INTRAVENOUS | Status: AC
  Administered 2019-01-06: 1000 mL via INTRAVENOUS

## 2019-01-06 MED ORDER — THIAMINE HCL 100 MG/ML IJ SOLN: 100.0000 mg | Freq: Every day | INTRAMUSCULAR | Status: DC

## 2019-01-06 MED ORDER — FENTANYL CITRATE (PF) 100 MCG/2ML IJ SOLN
100.0000 ug | Freq: Once | INTRAMUSCULAR | Status: AC
  Administered 2019-01-06: 100 ug via INTRAVENOUS
  Filled 2019-01-06: qty 2

## 2019-01-06 MED ORDER — VITAMIN B-1 100 MG PO TABS
100.0000 mg | ORAL_TABLET | Freq: Every day | ORAL | Status: DC
  Administered 2019-01-06 – 2019-01-07 (×2): 100 mg via ORAL
  Filled 2019-01-06 (×2): qty 1

## 2019-01-06 MED ORDER — POTASSIUM CHLORIDE CRYS ER 20 MEQ PO TBCR
40.0000 meq | EXTENDED_RELEASE_TABLET | ORAL | Status: AC
  Administered 2019-01-06 (×3): 40 meq via ORAL
  Filled 2019-01-06 (×3): qty 2

## 2019-01-06 MED ORDER — LORAZEPAM 2 MG/ML IJ SOLN: 1.0000 mg | INTRAMUSCULAR | Status: DC | PRN

## 2019-01-06 MED ORDER — POTASSIUM CHLORIDE IN NACL 40-0.9 MEQ/L-% IV SOLN
INTRAVENOUS | Status: AC
  Administered 2019-01-06 (×2): 150 mL/h via INTRAVENOUS
  Filled 2019-01-06 (×3): qty 1000

## 2019-01-06 MED ORDER — LORAZEPAM 1 MG PO TABS: 1.0000 mg | ORAL_TABLET | ORAL | Status: DC | PRN

## 2019-01-06 MED ORDER — FOLIC ACID 1 MG PO TABS
1.0000 mg | ORAL_TABLET | Freq: Every day | ORAL | Status: DC
  Administered 2019-01-06 – 2019-01-07 (×2): 1 mg via ORAL
  Filled 2019-01-06 (×2): qty 1

## 2019-01-06 MED ORDER — ADULT MULTIVITAMIN W/MINERALS CH
1.0000 | ORAL_TABLET | Freq: Every day | ORAL | Status: DC
  Administered 2019-01-06 – 2019-01-07 (×2): 1 via ORAL
  Filled 2019-01-06 (×2): qty 1

## 2019-01-06 MED ORDER — HEPARIN SODIUM (PORCINE) 5000 UNIT/ML IJ SOLN
5000.0000 [IU] | Freq: Three times a day (TID) | INTRAMUSCULAR | Status: DC
  Administered 2019-01-06 – 2019-01-07 (×4): 5000 [IU] via SUBCUTANEOUS
  Filled 2019-01-06 (×4): qty 1

## 2019-01-06 MED ORDER — MAGNESIUM SULFATE 2 GM/50ML IV SOLN
2.0000 g | Freq: Once | INTRAVENOUS | Status: AC
  Administered 2019-01-06: 2 g via INTRAVENOUS
  Filled 2019-01-06: qty 50

## 2019-01-06 MED ORDER — ACETAMINOPHEN 325 MG PO TABS
650.0000 mg | ORAL_TABLET | Freq: Four times a day (QID) | ORAL | Status: DC | PRN
  Administered 2019-01-06: 650 mg via ORAL
  Filled 2019-01-06: qty 2

## 2019-01-06 MED ORDER — ACETAMINOPHEN 650 MG RE SUPP: 650.0000 mg | Freq: Four times a day (QID) | RECTAL | Status: DC | PRN

## 2019-01-06 NOTE — ED Notes (Signed)
Breakfast Ordered 

## 2019-01-06 NOTE — Progress Notes (Signed)
Pt received from ED. VSS. CHG compete. Telemetry applied. Pt oriented to room and unit. Call light in reach. Will continue to monitor.  Clyde Canterbury, RN

## 2019-01-06 NOTE — Progress Notes (Addendum)
Patient admitted to OBS after midnight. Care began in ED before midnight.    Patient with hx htn, hiatal hernia and ETOH abuse present with generalized weakness, dizziness, legs cramps. Workup reveals orthostatic hypotension, severe hypokalemia, hypomagnesemia, QT prolongation, aki, elevated ast and doe   Patient reports binge drinking pattern I.e. only one or two drinks daily Monday-Thursday and then continuous drinking from Friday evening to Sunday night.   A/P Severe hypokalemia, hypomagnesemia, QT prolongation on EKG, Potassium 2.1.  Magnesium 1.6. likely related dehydration secondary to decreased po intake in setting of etoh abuse. No vomiting, diarrhea.. -Replete potassium and magnesium.  Continue to monitor electrolytes. -Cardiac monitoring -Keep potassium above 4 and magnesium above 2 -Avoid QT prolonging drugs if possible -IV fluids  Orthostatic hypotension. Secondary to above. Likely contributing to dizziness. In addition she reports losartan dose went from 7m to 1041mseveral weeks ago. . Marland KitchenIV fluid hydration -Repeat orthostatics in a.m. -Hold antihypertensives  Leukocytosis White blood cell count 17.2.  Patient is afebrile and nontoxic-appearing.  Chest x-ray without evidence of pneumonia. UA marginal.  -Continue to monitor white blood cell count  Alcohol abuse. Binge drinker but consume daily etoh. No s/sx withdrawal.  -CIWA protocol; Ativan as needed -obtain b12, folate -Thiamine, folate, multivitamin  AKI Creatinine 1.6 on admission improving with IV fluids.  baseline 0.7-0.9 three years ago. -IV fluid hydration -Continue to monitor renal function -Avoid nephrotoxic agents  Elevated LFTs AST 82, T bili 1.9.  ALT and alk phos normal.  No recent labs for comparison.  Abdominal exam benign. -Right upper quadrant ultrasound -Continue to monitor LFTs  Atypical chest pain. Denies cp this am.  High-sensitivity troponin 69 >49.  EKG not suggestive of ACS.   Currently chest pain-free and appears comfortable on exam. -Cardiac monitoring  Dyspnea on exertion. Patient reports intermittent dyspnea on exertion for several months.  Not tachypneic or hypoxic.  No signs of respiratory distress.  Chest x-ray showing no acute cardiopulmonary process. -Echocardiogram for further evaluation  KaDyanne CarrelNP  Will change to inpatient as she needs to remain in the hospital for aggressive K replacement and has exceeded the observation period. JeEulogio BearO

## 2019-01-06 NOTE — ED Provider Notes (Signed)
[facility] EMERGENCY DEPARTMENT Provider Note   CSN: [id number] Arrival date & time: 01/05/19  2158     History   Chief Complaint Chief Complaint  Patient presents with  . Chest Pain  . Shortness of Breath    HPI Sharon Hunt is a 48 y.o. female.     The history is provided by the patient and a relative.  Weakness Severity:  Moderate Onset quality:  Gradual Timing:  Intermittent Progression:  Worsening Chronicity:  New Context: alcohol use   Relieved by:  Nothing Worsened by:  Activity Associated symptoms: chest pain, cough, dizziness, headaches, nausea and vision change   Associated symptoms: no abdominal pain and no fever   Patient with history of hypertension and alcohol abuse presents with multiple complaints.  She reports the past 2 days she has had sharp chest pain.  She also reports recent dry cough.  She reports fatigue. She reports for several past several weeks she has had numbness from her abdomen down to her feet.  She also reports numbness in both hands. She is also reporting intermittent headaches.  Daughter just arrived yesterday from [place].  She noted that her mother was extremely weak and had difficulty walking around.  Patient reports she has been working for home since [employer] hit, and has had minimal interaction with others.  She has had some changes in her medications per [facility]  No recent diarrhea.  No recent vomiting Patient ports every time she stands she has blurred vision and feels weak.  Past Medical History:  Diagnosis Date  . H/O hiatal hernia   . Hypertension     Patient Active Problem List   Diagnosis Date Noted  . Dysmenorrhea 03/07/2013  . Leiomyoma of uterus, unspecified 03/07/2013  . Abnormal uterine bleeding (AUB) 03/07/2013  . Routine gynecological examination 03/07/2013  . Hyperlipidemia 08/22/2011  . Left-sided weakness 08/21/2011  . Alcohol abuse 08/21/2011  . HTN (hypertension) 08/21/2011   . Tobacco abuse 08/21/2011  . Low vitamin B12 level 08/21/2011  . Iron deficiency 08/21/2011  . Alcohol intoxication ([place]) 08/21/2011    Past Surgical History:  Procedure Laterality Date  . KNEE SURGERY    . TONSILLECTOMY    . TUBAL LIGATION       OB History   No obstetric history on file.      Home Medications    Prior to Admission medications   Medication Sig Start Date End Date Taking? Authorizing Provider  amLODipine (NORVASC) 10 MG tablet Take 0.5 tablets (5 mg total) by mouth daily. 04/04/15   Dorena Dew, FNP  aspirin 81 MG chewable tablet Chew 81 mg by mouth daily. Reported on 04/04/2015    [provider]  atorvastatin (LIPITOR) 20 MG tablet Take 1 tablet (20 mg total) by mouth daily. 04/05/15   Dorena Dew, FNP  gabapentin (NEURONTIN) 100 MG capsule Take 1 capsule (100 mg total) by mouth 3 (three) times daily. 04/04/15   Dorena Dew, FNP  LORazepam (ATIVAN) 1 MG tablet Take 1 tablet (1 mg total) by mouth every 8 (eight) hours as needed for anxiety. 03/09/15   Quintella Reichert, MD  omeprazole (PRILOSEC) 20 MG capsule Take 20 mg by mouth daily. Reported on 04/04/2015    [provider]  promethazine (PHENERGAN) 25 MG tablet Take 1 tablet (25 mg total) by mouth every 8 (eight) hours as needed for nausea or vomiting. 03/09/15   Quintella Reichert, MD  ranitidine (ZANTAC) 150 MG capsule Take  1 capsule (150 mg total) by mouth 2 (two) times daily. 03/09/15   Quintella Reichert, MD  sucralfate (CARAFATE) 1 GM/10ML suspension Take 10 mLs (1 g total) by mouth 4 (four) times daily -  with meals and at bedtime. Patient not taking: Reported on 04/04/2015 03/09/15   Quintella Reichert, MD    Family History No family history on file.  Social History Social History   Tobacco Use  . Smoking status: Current Every Day Smoker    Packs/day: 1.00    Types: Cigarettes  Substance Use Topics  . Alcohol use: No    Alcohol/week: 4.0 standard drinks    Types: 4 Cans  of beer per week    Comment: y  . Drug use: No     Allergies   Patient has no known allergies.   Review of Systems Review of Systems  Constitutional: Positive for fatigue. Negative for fever.  Eyes: Positive for visual disturbance.  Respiratory: Positive for cough.   Cardiovascular: Positive for chest pain.  Gastrointestinal: Positive for nausea. Negative for abdominal pain.  Neurological: Positive for dizziness, weakness, light-headedness, numbness and headaches.  All other systems reviewed and are negative.    Physical Exam Updated Vital Signs BP 106/90 (BP Location: Left Arm)   Pulse 97   Temp (!) 97.5 F (36.4 C) (Oral)   Resp (!) 22   LMP 12/10/2018 (Approximate)   SpO2 95%   Physical Exam CONSTITUTIONAL: Well developed/well nourished HEAD: Normocephalic/atraumatic EYES: EOMI/PERRL ENMT: Mucous membranes moist NECK: supple no meningeal signs SPINE/BACK:entire spine nontender CV: S1/S2 noted, murmur noted LUNGS: Lungs are clear to auscultation bilaterally, no apparent distress ABDOMEN: soft, nontender, no rebound or guarding, bowel sounds noted throughout abdomen GU:no cva tenderness NEURO: Pt is awake/alert/appropriate, moves all extremitiesx4.  No facial droop.  No arm or leg drift EXTREMITIES: pulses normal/equal x4, full ROM SKIN: warm, color normal PSYCH: Mildly anxious  ED Treatments / Results  Labs (all labs ordered are listed, but only abnormal results are displayed) Labs Reviewed  BASIC METABOLIC PANEL - Abnormal; Notable for the following components:      Result Value   Potassium 2.1 (*)    Chloride 85 (*)    Glucose, Bld 109 (*)    Creatinine, Ser 1.63 (*)    GFR calc non Af Amer 37 (*)    GFR calc Af Amer 42 (*)    Anion gap 20 (*)    All other components within normal limits  CBC - Abnormal; Notable for the following components:   WBC 17.2 (*)    MCV 77.4 (*)    RDW 24.4 (*)    All other components within normal limits  MAGNESIUM -  Abnormal; Notable for the following components:   Magnesium 1.6 (*)    All other components within normal limits  HEPATIC FUNCTION PANEL - Abnormal; Notable for the following components:   Albumin 3.4 (*)    AST 82 (*)    Total Bilirubin 1.9 (*)    Bilirubin, Direct 0.5 (*)    Indirect Bilirubin 1.4 (*)    All other components within normal limits  ACETAMINOPHEN LEVEL - Abnormal; Notable for the following components:   Acetaminophen (Tylenol), Serum <10 (*)    All other components within normal limits  TROPONIN I (HIGH SENSITIVITY) - Abnormal; Notable for the following components:   Troponin I (High Sensitivity) 69 (*)    All other components within normal limits  TROPONIN I (HIGH SENSITIVITY) - Abnormal; Notable for the  following components:   Troponin I (High Sensitivity) 49 (*)    All other components within normal limits  SARS CORONAVIRUS 2 (TAT 6-24 HRS)  PROTIME-INR  SALICYLATE LEVEL  ETHANOL  I-STAT BETA HCG BLOOD, ED ([facility], [facility], [facility] ONLY)    EKG EKG Interpretation  Date/Time:  Wednesday January 05 2019 22:13:13 EDT Ventricular Rate:  98 PR Interval:  114 QRS Duration: 88 QT Interval:  418 QTC Calculation: 533 R Axis:   55 Text Interpretation:  Normal sinus rhythm Nonspecific ST and T wave abnormality Prolonged QT Abnormal ECG Confirmed by Ripley Fraise [phone]) on 01/06/2019 12:07:14 AM   Radiology Dg Chest 2 View  Result Date: 01/05/2019 CLINICAL DATA:  Chest pain EXAM: CHEST - 2 VIEW COMPARISON:  August 21, 2011 FINDINGS: The heart size and mediastinal contours are within normal limits. Both lungs are clear. The visualized skeletal structures are unremarkable. IMPRESSION: No acute cardiopulmonary process. Electronically Signed   By: Prudencio Pair M.D.   On: 01/05/2019 23:10    Procedures .Critical Care Performed by: Ripley Fraise, MD Authorized by: Ripley Fraise, MD   Critical care provider statement:    Critical care time (minutes):  50   Critical care  start time:  01/06/2019 12:55 AM   Critical care end time:  01/06/2019 1:45 AM   Critical care time was exclusive of:  Separately billable procedures and treating other patients   Critical care was necessary to treat or prevent imminent or life-threatening deterioration of the following conditions:  Circulatory failure and metabolic crisis   Critical care was time spent personally by me on the following activities:  Evaluation of patient's response to treatment, examination of patient, re-evaluation of patient's condition, pulse oximetry, ordering and review of radiographic studies, ordering and review of laboratory studies, ordering and performing treatments and interventions, discussions with consultants and development of treatment plan with patient or surrogate   I assumed direction of critical care for this patient from another provider in my specialty: no      Medications Ordered in ED Medications  magnesium sulfate IVPB 2 g 50 mL (2 g Intravenous New Bag/Given 01/06/19 0235)  sodium chloride flush (NS) 0.9 % injection 3 mL (3 mLs Intravenous Given 01/06/19 0006)  sodium chloride 0.9 % bolus 1,000 mL (0 mLs Intravenous Stopped 01/06/19 0220)  potassium chloride 10 mEq in 100 mL IVPB (0 mEq Intravenous Stopped 01/06/19 0220)  fentaNYL (SUBLIMAZE) injection 100 mcg (100 mcg Intravenous Given 01/06/19 0042)     Initial Impression / Assessment and Plan / ED Course  I have reviewed the triage vital signs and the nursing notes.  Pertinent labs & imaging results that were available during my care of the patient were reviewed by me and considered in my medical decision making (see chart for details).        12:56 AM Patient presents for multiple complaints.  However she is noted to have severe hypokalemia with acute EKG changes.  She reports history of alcohol abuse but actually admits to cutting back recently.  She also reports feeling weak and dizzy upon standing.  She appears to have  orthostatic hypotension.  Suspect most of her symptoms are due to metabolic derangements. However she does report recent cough/chest pain. Pt will require admission 3:14 AM Patient appears to be improving.  She is in no acute distress. However due to significant metabolic derangements and dehydration she will require admission to the hospital. EKG does reveal signs of prolonged QT likely due to hypokalemia  Discussed with Dr. Marlowe Sax for admission  Sharon Hunt was evaluated in Emergency Department on 01/06/2019 for the symptoms described in the history of present illness. She was evaluated in the context of the global COVID-19 pandemic, which necessitated consideration that the patient might be at risk for infection with the SARS-CoV-2 virus that causes COVID-19. Institutional protocols and algorithms that pertain to the evaluation of patients at risk for COVID-19 are in a state of rapid change based on information released by regulatory bodies including the CDC and federal and state organizations. These policies and algorithms were followed during the patient's care in the ED.   Final Clinical Impressions(s) / ED Diagnoses   Final diagnoses:  Hypokalemia  Hypomagnesemia  Cough  Precordial pain  Dehydration  Orthostatic hypotension    ED Discharge Orders    None       Ripley Fraise, MD 01/06/19 [phone]

## 2019-01-06 NOTE — ED Notes (Signed)
Patient transported to Ultrasound 

## 2019-01-06 NOTE — ED Notes (Signed)
Tele ordered lunch

## 2019-01-06 NOTE — H&P (Signed)
History and Physical    Sharon Hunt IDP:[id number] DOB: 11-Oct-1970 DOA: 01/05/2019  PCP: Dorena Dew, FNP Patient coming from: Home  Chief Complaint: Multiple complaints  HPI: Sharon Hunt is a 48 y.o. female with medical history significant of hypertension, hiatal hernia presenting to the ED with multiple complaints.  Patient was digressing a lot and it was difficult to obtain a thorough history from her.  States she works from home and has noticed that when she goes from sitting to standing position she starts feeling dizzy.  This has been going on for several months.  States 2 weeks ago her physician doubled the dose of her home blood pressure medication losartan.  She has also been having intermittent dyspnea on exertion for the past few months.  She has been having intermittent sharp chest pain underneath her left breast which lasts only a few seconds and occurs at rest.  No chest pain at present.  ED Course: Orthostatics positive. Afebrile.  White blood cell count 17.2.  Potassium 2.1.  Magnesium 1.6.  Creatinine 1.6, baseline 0.7-0.9 three years ago.  High-sensitivity troponin 69 >49.  EKG with evidence of QT prolongation but no acute ischemic changes.  Salicylate, ethanol, and acetaminophen levels not elevated.  AST 82, T bili 1.9.  ALT and alk phos normal.  LFTs were normal 3 years ago.  SARS-CoV-2 test pending.  Chest x-ray showing no acute cardiopulmonary process. Patient received fentanyl, IV magnesium 2 g, potassium 10 mEq, and 1 L normal saline bolus.  Review of Systems:  All systems reviewed and apart from history of presenting illness, are negative.  Past Medical History:  Diagnosis Date  . H/O hiatal hernia   . Hypertension     Past Surgical History:  Procedure Laterality Date  . KNEE SURGERY    . TONSILLECTOMY    . TUBAL LIGATION       reports that she has been smoking cigarettes. She has been smoking about 1.00 pack per day. She does not have any  smokeless tobacco history on file. She reports that she does not drink alcohol or use drugs.  No Known Allergies  History reviewed. No pertinent family history.  Prior to Admission medications   Medication Sig Start Date End Date Taking? Authorizing Provider  amLODipine (NORVASC) 10 MG tablet Take 0.5 tablets (5 mg total) by mouth daily. 04/04/15   Dorena Dew, FNP  aspirin 81 MG chewable tablet Chew 81 mg by mouth daily. Reported on 04/04/2015    [provider]  atorvastatin (LIPITOR) 20 MG tablet Take 1 tablet (20 mg total) by mouth daily. 04/05/15   Dorena Dew, FNP  gabapentin (NEURONTIN) 100 MG capsule Take 1 capsule (100 mg total) by mouth 3 (three) times daily. 04/04/15   Dorena Dew, FNP  LORazepam (ATIVAN) 1 MG tablet Take 1 tablet (1 mg total) by mouth every 8 (eight) hours as needed for anxiety. 03/09/15   Quintella Reichert, MD  omeprazole (PRILOSEC) 20 MG capsule Take 20 mg by mouth daily. Reported on 04/04/2015    [provider]  promethazine (PHENERGAN) 25 MG tablet Take 1 tablet (25 mg total) by mouth every 8 (eight) hours as needed for nausea or vomiting. 03/09/15   Quintella Reichert, MD  ranitidine (ZANTAC) 150 MG capsule Take 1 capsule (150 mg total) by mouth 2 (two) times daily. 03/09/15   Quintella Reichert, MD  sucralfate (CARAFATE) 1 GM/10ML suspension Take 10 mLs (1 g total) by mouth 4 (four)  times daily -  with meals and at bedtime. Patient not taking: Reported on 04/04/2015 03/09/15   Quintella Reichert, MD    Physical Exam: Vitals:   01/06/19 0430 01/06/19 0500 01/06/19 0530 01/06/19 0736  BP: 138/83 128/80 133/80   Pulse: 79 78 79 80  Resp: '17 15 18 ' (!) 24  Temp:      TempSrc:      SpO2: 96% 97% 97% 99%    Physical Exam  Constitutional: She is oriented to person, place, and time. She appears well-developed and well-nourished. No distress.  Resting comfortably watching television  HENT:  Head: Normocephalic.  Eyes: Right eye exhibits  no discharge. Left eye exhibits no discharge.  Neck: Neck supple.  Cardiovascular: Normal rate, regular rhythm and intact distal pulses.  Pulmonary/Chest: Effort normal and breath sounds normal. No respiratory distress. She has no wheezes. She has no rales.  Abdominal: Soft. Bowel sounds are normal. She exhibits no distension. There is no abdominal tenderness. There is no guarding.  Musculoskeletal:        General: No edema.  Neurological: She is alert and oriented to person, place, and time.  Skin: Skin is warm and dry. She is not diaphoretic.     Labs on Admission: I have personally reviewed following labs and imaging studies  CBC: Recent Labs  Lab 01/05/19 2219 01/06/19 0603  WBC 17.2* 17.9*  HGB 13.1 11.5*  HCT 38.4 33.7*  MCV 77.4* 77.5*  PLT 366 161   Basic Metabolic Panel: Recent Labs  Lab 01/05/19 2219 01/06/19 0033 01/06/19 0603  NA 136  --  135  K 2.1*  --  2.3*  CL 85*  --  87*  CO2 31  --  33*  GLUCOSE 109*  --  110*  BUN 13  --  14  CREATININE 1.63*  --  1.22*  CALCIUM 9.4  --  8.6*  MG  --  1.6* 2.3   GFR: CrCl cannot be calculated (Unknown ideal weight.). Liver Function Tests: Recent Labs  Lab 01/06/19 0033 01/06/19 0603  AST 82* 69*  ALT 29 26  ALKPHOS 94 87  BILITOT 1.9* 1.5*  PROT 7.0 6.3*  ALBUMIN 3.4* 3.1*   No results for input(s): LIPASE, AMYLASE in the last 168 hours. No results for input(s): AMMONIA in the last 168 hours. Coagulation Profile: Recent Labs  Lab 01/05/19 2219  INR 1.1   Cardiac Enzymes: No results for input(s): CKTOTAL, CKMB, CKMBINDEX, TROPONINI in the last 168 hours. BNP (last 3 results) No results for input(s): PROBNP in the last 8760 hours. HbA1C: No results for input(s): HGBA1C in the last 72 hours. CBG: No results for input(s): GLUCAP in the last 168 hours. Lipid Profile: No results for input(s): CHOL, HDL, LDLCALC, TRIG, CHOLHDL, LDLDIRECT in the last 72 hours. Thyroid Function Tests: No results for  input(s): TSH, T4TOTAL, FREET4, T3FREE, THYROIDAB in the last 72 hours. Anemia Panel: No results for input(s): VITAMINB12, FOLATE, FERRITIN, TIBC, IRON, RETICCTPCT in the last 72 hours. Urine analysis:    Component Value Date/Time   COLORURINE AMBER BIOCHEMICALS MAY BE AFFECTED BY COLOR (A) 03/01/2007 1938   APPEARANCEUR CLEAR 03/01/2007 1938   LABSPEC >=1.030 04/04/2015 1223   PHURINE 5.5 04/04/2015 1223   GLUCOSEU NEGATIVE 04/04/2015 1223   HGBUR NEGATIVE 04/04/2015 1223   BILIRUBINUR NEGATIVE 04/04/2015 1223   KETONESUR NEGATIVE 04/04/2015 1223   PROTEINUR NEGATIVE 04/04/2015 1223   UROBILINOGEN 0.2 04/04/2015 1223   NITRITE NEGATIVE 04/04/2015 1223   LEUKOCYTESUR NEGATIVE  04/04/2015 1223    Radiological Exams on Admission: Dg Chest 2 View  Result Date: 01/05/2019 CLINICAL DATA:  Chest pain EXAM: CHEST - 2 VIEW COMPARISON:  August 21, 2011 FINDINGS: The heart size and mediastinal contours are within normal limits. Both lungs are clear. The visualized skeletal structures are unremarkable. IMPRESSION: No acute cardiopulmonary process. Electronically Signed   By: Prudencio Pair M.D.   On: 01/05/2019 23:10   US Abdomen Limited Ruq  Result Date: 01/06/2019 CLINICAL DATA:  Elevated LFTs. EXAM: ULTRASOUND ABDOMEN LIMITED RIGHT UPPER QUADRANT COMPARISON:  None. FINDINGS: Gallbladder: Physiologically distended. Layering sludge. Round 5-6 mm nonshadowing echogenic structure may be a gallbladder polyp or nonshadowing stone. No gallbladder wall thickening. No sonographic Murphy sign noted by sonographer. Common bile duct: Diameter: 5 mm, normal. Liver: No focal lesion identified. Diffusely increased and heterogeneous in parenchymal echogenicity. There is an area of focal fatty sparing in the central left liver. Portal vein is patent on color Doppler imaging with normal direction of blood flow towards the liver. Other: No ascites. IMPRESSION: 1. Heterogeneously increased hepatic echogenicity consistent  with steatosis. 2. Gallbladder sludge. 5 mm gallbladder polyp versus nonshadowing stone. Electronically Signed   By: Keith Rake M.D.   On: 01/06/2019 06:06    EKG: Independently reviewed.  Sinus rhythm, QTc 533.  Assessment/Plan Principal Problem:   Hypokalemia Active Problems:   Hypomagnesemia   QT prolongation   Orthostatic hypotension   Leukocytosis   Severe hypokalemia, hypomagnesemia QT prolongation on EKG Potassium 2.1.  Magnesium 1.6.  Unclear why she has severe hypokalemia.  Not on a diuretic.  Not endorsing vomiting, diarrhea, or decreased p.o. intake.  EKG with QT prolongation. -Replete potassium and magnesium.  Continue to monitor electrolytes. -Cardiac monitoring -Keep potassium above 4 and magnesium above 2 -Avoid QT prolonging drugs if possible  Orthostatic hypotension Likely contributing to dizziness.  Orthostatics positive.  Dose of home losartan was doubled 2 weeks ago. -IV fluid hydration -Repeat orthostatics in a.m. -Hold antihypertensives  Leukocytosis White blood cell count 17.2.  Patient is afebrile and nontoxic-appearing.  Chest x-ray without evidence of pneumonia. -UA -Continue to monitor white blood cell count  Alcohol abuse Per ED provider documentation, patient reported history of alcohol abuse.  Cutting back recently.  No signs of withdrawal at this time. -CIWA protocol; Ativan as needed -Thiamine, folate, multivitamin  AKI Creatinine 1.6, baseline 0.7-0.9 three years ago. -IV fluid hydration -Continue to monitor renal function -Avoid nephrotoxic agents  Elevated LFTs AST 82, T bili 1.9.  ALT and alk phos normal.  No recent labs for comparison.  Abdominal exam benign. -Right upper quadrant ultrasound -Continue to monitor LFTs  Atypical chest pain High-sensitivity troponin 69 >49.  EKG not suggestive of ACS.  Currently chest pain-free and appears comfortable on exam. -Cardiac monitoring  Dyspnea on exertion Patient reports  intermittent dyspnea on exertion for several months.  Not tachypneic or hypoxic.  No signs of respiratory distress.  Chest x-ray showing no acute cardiopulmonary process. -Echocardiogram for further evaluation  DVT prophylaxis: Subcutaneous heparin Code Status: Full code Family Communication: No family available. Disposition Plan: Anticipate discharge after clinical improvement. Consults called: None Admission status: It is my clinical opinion that referral for OBSERVATION is reasonable and necessary in this patient based on the above information provided. The aforementioned taken together are felt to place the patient at high risk for further clinical deterioration. However it is anticipated that the patient may be medically stable for discharge from the hospital within 24  to 48 hours.  The medical decision making on this patient was of high complexity and the patient is at high risk for clinical deterioration, therefore this is a level 3 visit.  Shela Leff MD [facility] Pager [phone]  If 7PM-7AM, please contact night-coverage [email or web address] Password [facility]  01/06/2019, 7:41 AM

## 2019-01-06 NOTE — Progress Notes (Signed)
  Echocardiogram 2D Echocardiogram has been performed.  Jennette Dubin 01/06/2019, 3:57 PM

## 2019-01-06 NOTE — ED Notes (Signed)
TELE 

## 2019-01-06 NOTE — ED Notes (Signed)
Pure Wick was placed on patient. 

## 2019-01-07 LAB — CBC
HCT: 31.4 % — ABNORMAL LOW (ref 36.0–46.0)
Hemoglobin: 10.4 g/dL — ABNORMAL LOW (ref 12.0–15.0)
MCH: 26.5 pg (ref 26.0–34.0)
MCHC: 33.1 g/dL (ref 30.0–36.0)
MCV: 79.9 fL — ABNORMAL LOW (ref 80.0–100.0)
Platelets: 289 10*3/uL (ref 150–400)
RBC: 3.93 MIL/uL (ref 3.87–5.11)
RDW: 24.2 % — ABNORMAL HIGH (ref 11.5–15.5)
WBC: 12.2 10*3/uL — ABNORMAL HIGH (ref 4.0–10.5)
nRBC: 0.2 % (ref 0.0–0.2)

## 2019-01-07 LAB — COMPREHENSIVE METABOLIC PANEL
ALT: 34 U/L (ref 0–44)
AST: 90 U/L — ABNORMAL HIGH (ref 15–41)
Albumin: 2.7 g/dL — ABNORMAL LOW (ref 3.5–5.0)
Alkaline Phosphatase: 78 U/L (ref 38–126)
Anion gap: 9 (ref 5–15)
BUN: 12 mg/dL (ref 6–20)
CO2: 30 mmol/L (ref 22–32)
Calcium: 8.5 mg/dL — ABNORMAL LOW (ref 8.9–10.3)
Chloride: 100 mmol/L (ref 98–111)
Creatinine, Ser: 1.08 mg/dL — ABNORMAL HIGH (ref 0.44–1.00)
GFR calc Af Amer: >60 mL/min (ref >60)
GFR calc non Af Amer: >60 mL/min (ref >60)
Glucose, Bld: 105 mg/dL — ABNORMAL HIGH (ref 70–99)
Potassium: 3.2 mmol/L — ABNORMAL LOW (ref 3.5–5.1)
Sodium: 139 mmol/L (ref 135–145)
Total Bilirubin: 1.1 mg/dL (ref 0.3–1.2)
Total Protein: 5.6 g/dL — ABNORMAL LOW (ref 6.5–8.1)

## 2019-01-07 LAB — FOLATE RBC
Folate, Hemolysate: 258 ng/mL
Folate, RBC: 804 ng/mL (ref >498)
Hematocrit: 32.1 % — ABNORMAL LOW (ref 34.0–46.6)

## 2019-01-07 LAB — MAGNESIUM: Magnesium: 1.8 mg/dL (ref 1.7–2.4)

## 2019-01-07 MED ORDER — ADULT MULTIVITAMIN W/MINERALS CH: 1.0000 | ORAL_TABLET | Freq: Every day | ORAL | 0 refills | Status: DC

## 2019-01-07 MED ORDER — B COMPLEX-C PO TABS: 1.0000 | ORAL_TABLET | Freq: Every day | ORAL | 1 refills | Status: DC

## 2019-01-07 MED ORDER — LOSARTAN POTASSIUM 50 MG PO TABS
50.0000 mg | ORAL_TABLET | Freq: Every day | ORAL | Status: DC
  Administered 2019-01-07: 50 mg via ORAL
  Filled 2019-01-07: qty 1

## 2019-01-07 MED ORDER — GABAPENTIN 300 MG PO CAPS: 300.0000 mg | ORAL_CAPSULE | Freq: Every day | ORAL | Status: DC

## 2019-01-07 MED ORDER — MAGNESIUM OXIDE 400 (241.3 MG) MG PO TABS: 400.0000 mg | ORAL_TABLET | Freq: Every day | ORAL | 1 refills | Status: DC

## 2019-01-07 MED ORDER — GABAPENTIN 100 MG PO CAPS: 100.0000 mg | ORAL_CAPSULE | Freq: Three times a day (TID) | ORAL | Status: DC

## 2019-01-07 MED ORDER — FOLIC ACID 1 MG PO TABS: 1.0000 mg | ORAL_TABLET | Freq: Every day | ORAL | 1 refills | Status: DC

## 2019-01-07 MED ORDER — CYANOCOBALAMIN 1000 MCG/ML IJ SOLN
1000.0000 ug | Freq: Once | INTRAMUSCULAR | Status: AC
  Administered 2019-01-07: 1000 ug via INTRAMUSCULAR
  Filled 2019-01-07: qty 1

## 2019-01-07 MED ORDER — MAGNESIUM OXIDE 400 (241.3 MG) MG PO TABS
400.0000 mg | ORAL_TABLET | Freq: Every day | ORAL | Status: DC
  Administered 2019-01-07: 400 mg via ORAL
  Filled 2019-01-07: qty 1

## 2019-01-07 MED ORDER — POTASSIUM CHLORIDE CRYS ER 20 MEQ PO TBCR
40.0000 meq | EXTENDED_RELEASE_TABLET | ORAL | Status: DC
  Administered 2019-01-07: 40 meq via ORAL
  Filled 2019-01-07: qty 2

## 2019-01-07 MED ORDER — GABAPENTIN 100 MG PO CAPS: 100.0000 mg | ORAL_CAPSULE | Freq: Three times a day (TID) | ORAL | 1 refills | Status: DC

## 2019-01-07 MED ORDER — CLOTRIMAZOLE 1 % VA CREA: 1.0000 | TOPICAL_CREAM | Freq: Every day | VAGINAL | 0 refills | Status: DC

## 2019-01-07 MED ORDER — B COMPLEX-C PO TABS
1.0000 | ORAL_TABLET | Freq: Every day | ORAL | Status: DC
  Administered 2019-01-07: 1 via ORAL
  Filled 2019-01-07: qty 1

## 2019-01-07 MED ORDER — AMLODIPINE BESYLATE 5 MG PO TABS
5.0000 mg | ORAL_TABLET | Freq: Every day | ORAL | Status: DC
  Administered 2019-01-07: 5 mg via ORAL
  Filled 2019-01-07: qty 1

## 2019-01-07 MED ORDER — THIAMINE HCL 100 MG PO TABS: 100.0000 mg | ORAL_TABLET | Freq: Every day | ORAL | 0 refills | Status: DC

## 2019-01-07 MED ORDER — PANTOPRAZOLE SODIUM 40 MG PO TBEC
80.0000 mg | DELAYED_RELEASE_TABLET | Freq: Every day | ORAL | Status: DC
  Administered 2019-01-07: 80 mg via ORAL
  Filled 2019-01-07: qty 2

## 2019-01-07 MED ORDER — CLOTRIMAZOLE 1 % VA CREA
1.0000 | TOPICAL_CREAM | Freq: Every day | VAGINAL | Status: DC
  Filled 2019-01-07: qty 45

## 2019-01-07 MED FILL — GABAPENTIN 100 MG CAPSULE: 100 | 10 days supply | Qty: 30 | Fill #0

## 2019-01-07 MED FILL — MAGNESIUM OXIDE 400 MG TABS: 400 | 30 days supply | Qty: 30 | Fill #0

## 2019-01-07 MED FILL — B-COMPLEX: 30 days supply | Qty: 30 | Fill #0

## 2019-01-07 MED FILL — FOLIC ACID 1 MG TABS: 1 | 30 days supply | Qty: 30 | Fill #0

## 2019-01-07 MED FILL — VITAMIN B-1 100 MG TABS: 100 | 30 days supply | Qty: 30 | Fill #0

## 2019-01-07 NOTE — Evaluation (Signed)
Physical Therapy Evaluation Patient Details Name: Sharon Hunt MRN: [id number] DOB: Dec 06, 1970 Today's Date: 01/07/2019   History of Present Illness  48 y.o. female with hx htn, hiatal hernia and ETOH abuse present with generalized weakness, dizziness, legs cramps. Workup reveals orthostatic hypotension, severe hypokalemia, hypomagnesemia, QT prolongation, aki, elevated ast and doe. Pt reporting BLE numbness on arrival  Clinical Impression  Pt demonstrates deficits in sensation, balance, gait, compared to baseline. Pt is able to ambulate household and limited community distances with supervision at this time and without UE support. Pt reports new numbness from waist down to toes this session, affecting her balance. Pt also reports dizziness during mobility and will benefit from vestibular PT services in the acute setting and at discharge. PT recommends home health PT, no DME needs.    Follow Up Recommendations Home health PT(HHPT preferably with vestibular training)    Equipment Recommendations  None recommended by PT(pt owns walking stick)    Recommendations for Other Services       Precautions / Restrictions Precautions Precautions: Fall Restrictions Weight Bearing Restrictions: No      Mobility  Bed Mobility Overal bed mobility: Modified Independent             General bed mobility comments: HOB elevated  Transfers Overall transfer level: Independent Equipment used: None                Ambulation/Gait Ambulation/Gait assistance: Supervision Gait Distance (Feet): 150 Feet Assistive device: (intermittent use of railing initially) Gait Pattern/deviations: Step-through pattern;Drifts right/left Gait velocity: reduced Gait velocity interpretation: 1.31 - 2.62 ft/sec, indicative of limited community ambulator General Gait Details: pt with slowed step through gait with slight increase in lateral sway. No significant LOB. Pt able to squat to floor to pick up  object without LOB or UE support  Stairs            Wheelchair Mobility    Modified Rankin (Stroke Patients Only)       Balance Overall balance assessment: Needs assistance Sitting-balance support: No upper extremity supported;Feet supported Sitting balance-Leahy Scale: Normal     Standing balance support: No upper extremity supported Standing balance-Leahy Scale: Good Standing balance comment: supervision of 1 person for dynamic balance                             Pertinent Vitals/Pain Pain Assessment: No/denies pain    Home Living Family/patient expects to be discharged to:: Private residence Living Arrangements: Alone Available Help at Discharge: Family;Available 24 hours/day(temporarily) Type of Home: House Home Access: Level entry     Home Layout: One level Home Equipment: (walking stick)      Prior Function Level of Independence: Independent               Hand Dominance        Extremity/Trunk Assessment   Upper Extremity Assessment Upper Extremity Assessment: RUE deficits/detail;LUE deficits/detail RUE Sensation: decreased light touch(in fingers) LUE Sensation: decreased light touch(in fingers)    Lower Extremity Assessment Lower Extremity Assessment: RLE deficits/detail;LLE deficits/detail RLE Sensation: decreased light touch;history of peripheral neuropathy(waist down numbness) LLE Sensation: history of peripheral neuropathy;decreased light touch(waist to toes numbness)    Cervical / Trunk Assessment Cervical / Trunk Assessment: Normal  Communication   Communication: No difficulties  Cognition Arousal/Alertness: Awake/alert Behavior During Therapy: WFL for tasks assessed/performed Overall Cognitive Status: Within Functional Limits for tasks assessed  General Comments      Exercises     Assessment/Plan    PT Assessment Patient needs continued PT services  PT  Problem List Decreased mobility;Decreased balance;Decreased knowledge of use of DME;Impaired sensation       PT Treatment Interventions DME instruction;Gait training;Functional mobility training;Neuromuscular re-education;Patient/family education    PT Goals (Current goals can be found in the Care Plan section)  Acute Rehab PT Goals Patient Stated Goal: To go home PT Goal Formulation: With patient Time For Goal Achievement: 01/21/19 Potential to Achieve Goals: Good Additional Goals Additional Goal #1: Pt will maintain dynamic standing balance within 10 inches of base of support independently    Frequency Min 3X/week   Barriers to discharge        Co-evaluation               AM-PAC PT "6 Clicks" Mobility  Outcome Measure Help needed turning from your back to your side while in a flat bed without using bedrails?: None Help needed moving from lying on your back to sitting on the side of a flat bed without using bedrails?: None Help needed moving to and from a bed to a chair (including a wheelchair)?: None Help needed standing up from a chair using your arms (e.g., wheelchair or bedside chair)?: None Help needed to walk in hospital room?: None Help needed climbing 3-5 steps with a railing? : None 6 Click Score: 24    End of Session Equipment Utilized During Treatment: (none) Activity Tolerance: Patient tolerated treatment well Patient left: in bed;with call bell/phone within reach;with bed alarm set Nurse Communication: Mobility status PT Visit Diagnosis: Other abnormalities of gait and mobility (R26.89)    Time: [id number] PT Time Calculation (min) (ACUTE ONLY): 23 min   Charges:   PT Evaluation $PT Eval Low Complexity: Sherman, PT, DPT Acute Rehabilitation Pager: [phone]   Zenaida Niece 01/07/2019, 10:49 AM

## 2019-01-07 NOTE — Discharge Summary (Signed)
Physician Discharge Summary  Sharon Hunt [id number] DOB: [birth year] DOA: 01/05/2019  PCP: Dorena Dew, FNP  Admit date: 01/05/2019 Discharge date: 01/07/2019  Time spent: 40 minutes  Recommendations for Outpatient Follow-up:  1. Follow up with PCP in 1-2 weeks for evaluation of symptoms. Recommend CMET to evaluate electrolytes and B12 anemia panel. Follow BP control as well 2. Home health PT    Discharge Diagnoses:  Principal Problem:   Hypokalemia Active Problems:   Hypomagnesemia   QT prolongation   Orthostatic hypotension   Metabolic acidosis   AKI (acute kidney injury) ([place])   DOE (dyspnea on exertion)   Leukocytosis   Discharge Condition: stable  Diet recommendation: heart healthy  Filed Weights   01/06/19 0740  Weight: 84 kg    History of present illness:  Sharon Hunt is a 48 y.o. female with medical history significant of hypertension, hiatal hernia presenting to the ED 10/21 with multiple complaints.  Patient was digressing a lot and it was difficult to obtain a thorough history from her.  States she works from home and has noticed that when she goes from sitting to standing position she starts feeling dizzy.  This has been going on for several months.  States 2 weeks ago her physician doubled the dose of her home blood pressure medication losartan.  She has also been having intermittent dyspnea on exertion for the past few months.  She has been having intermittent sharp chest pain underneath her left breast which lasts only a few seconds and occurs at rest.  No chest pain at present.  Hospital Course:  Severe hypokalemia, hypomagnesemia, QT prolongation on EKG, Potassium 2.1. Magnesium 1.6. likely related dehydration secondary to decreased po intake in setting of etoh abuse. No vomiting, diarrhea..provided with supplements and IV fluids. At discharge potassium 3.2 and magnesium 1.8. she is provided with oral supplements. Advised to follow up with  PCP 1-2 weeks for lab work.   Orthostatic hypotension. Secondary to above. Likely contributing to dizziness. In addition she reports losartan dose went from 40m to 109mseveral weeks ago. resolved at discharge. Will discharge with losartan at 2567mFollow up with PCP 1-2 weeks.  Leukocytosis White blood cell count 17.2 on admission. WBC 12.2 at discharge. Patient remained afebrile and nontoxic-appearing. Chest x-ray without evidence of pneumonia. UA marginal.   Alcohol abuse. Binge drinker but consumes daily etoh. No s/sx withdrawal. CIWA score 2 at discharge. Discharged with vitamins, B12 supplement and recommendations to stop drinking  AKI Creatinine 1.6 on admission and 1.06 at discharge.   Elevated LFTs AST 82, T bili 1.9. ALT and alk phos normal. No recent labs for comparison. Abdominal exam benign. Abdominal US [place] Heterogeneously increased hepatic echogenicity consistent with steatosis. Gallbladder sludge. 5 mm gallbladder polyp versus nonshadowing stone.  Atypical chest pain. High-sensitivity troponin69 >49.EKG not suggestive of ACS. remained chest pain-free and appeared comfortable on exam. No events on tele. Echo with EF 60% and mild LVH.   Dyspnea on exertion. Patient reported intermittent dyspnea on exertion for several months. Not tachypneic or hypoxic. No signs of respiratory distress.Chest x-ray showing no acute cardiopulmonary process. Echocardiogram as noted above. No DOE at discharge  Neuropathy: complained of numbness bilateral LE. Likely related to B12 deficiency in setting of ETOH.  Reports being diagnosed with neuropathy in past and given neurontin which she stopped taking "a long time ago". Evaluated by PT who recommend HH [place]. Will resume neurontin   Procedures:    Consultations:  Discharge Exam: Vitals:   01/06/19 2101 01/07/19 0434  BP: 127/83 (!) 154/89  Pulse: 78 70  Resp: 18 18  Temp: 97.7 F (36.5 C) 97.8 F (36.6 C)  SpO2:  100% 100%    General: awake alert slightly irritable no acute distress Cardiovascular: rrr no mgr no LE edema Respiratory: normal effort BS clear bilaterally no wheeze  Discharge Instructions   Discharge Instructions    Call MD for:  severe uncontrolled pain   Complete by: As directed    Call MD for:  temperature >100.4   Complete by: As directed    Diet - low sodium heart healthy   Complete by: As directed    Discharge instructions   Complete by: As directed    Take medications as prescribed Follow up with PCP 1-2 weeks for evaluation of symptoms. Recommend lab work to evaluate electrolytes, B12 level and monitoring of BP control Stop etoh Home Health PT   Increase activity slowly   Complete by: As directed      Allergies as of 01/07/2019   No Known Allergies     Medication List    STOP taking these medications   LORazepam 1 MG tablet Commonly known as: Ativan     TAKE these medications   amLODipine 10 MG tablet Commonly known as: NORVASC Take 0.5 tablets (5 mg total) by mouth daily.   B-complex with vitamin C tablet Take 1 tablet by mouth daily.   folic acid 1 MG tablet Commonly known as: FOLVITE Take 1 tablet (1 mg total) by mouth daily. Start taking on: January 08, 2019   gabapentin 100 MG capsule Commonly known as: NEURONTIN Take 1 capsule (100 mg total) by mouth 3 (three) times daily.   losartan 100 MG tablet Commonly known as: COZAAR Take 50 mg by mouth daily.   magnesium oxide 400 (241.3 Mg) MG tablet Commonly known as: MAG-OX Take 1 tablet (400 mg total) by mouth daily.   multivitamin with minerals Tabs tablet Take 1 tablet by mouth daily. Start taking on: January 08, 2019   omeprazole 40 MG capsule Commonly known as: PRILOSEC Take 40 mg by mouth daily.   thiamine 100 MG tablet Take 1 tablet (100 mg total) by mouth daily. Start taking on: January 08, 2019      No Known Allergies    The results of significant diagnostics from  this hospitalization (including imaging, microbiology, ancillary and laboratory) are listed below for reference.    Significant Diagnostic Studies: Dg Chest 2 View  Result Date: 01/05/2019 CLINICAL DATA:  Chest pain EXAM: CHEST - 2 VIEW COMPARISON:  August 21, 2011 FINDINGS: The heart size and mediastinal contours are within normal limits. Both lungs are clear. The visualized skeletal structures are unremarkable. IMPRESSION: No acute cardiopulmonary process. Electronically Signed   By: Prudencio Pair M.D.   On: 01/05/2019 23:10   US Abdomen Limited Ruq  Result Date: 01/06/2019 CLINICAL DATA:  Elevated LFTs. EXAM: ULTRASOUND ABDOMEN LIMITED RIGHT UPPER QUADRANT COMPARISON:  None. FINDINGS: Gallbladder: Physiologically distended. Layering sludge. Round 5-6 mm nonshadowing echogenic structure may be a gallbladder polyp or nonshadowing stone. No gallbladder wall thickening. No sonographic Murphy sign noted by sonographer. Common bile duct: Diameter: 5 mm, normal. Liver: No focal lesion identified. Diffusely increased and heterogeneous in parenchymal echogenicity. There is an area of focal fatty sparing in the central left liver. Portal vein is patent on color Doppler imaging with normal direction of blood flow towards the liver. Other: No ascites. IMPRESSION:  1. Heterogeneously increased hepatic echogenicity consistent with steatosis. 2. Gallbladder sludge. 5 mm gallbladder polyp versus nonshadowing stone. Electronically Signed   By: Keith Rake M.D.   On: 01/06/2019 06:06    Microbiology: Recent Results (from the past 240 hour(s))  SARS CORONAVIRUS 2 (TAT 6-24 HRS) Nasopharyngeal Nasopharyngeal Swab     Status: None   Collection Time: 01/06/19 12:33 AM   Specimen: Nasopharyngeal Swab  Result Value Ref Range Status   SARS Coronavirus 2 NEGATIVE NEGATIVE Final    Comment: (NOTE) SARS-CoV-2 target nucleic acids are NOT DETECTED. The SARS-CoV-2 RNA is generally detectable in upper and  lower respiratory specimens during the acute phase of infection. Negative results do not preclude SARS-CoV-2 infection, do not rule out co-infections with other pathogens, and should not be used as the sole basis for treatment or other patient management decisions. Negative results must be combined with clinical observations, patient history, and epidemiological information. The expected result is Negative. Fact Sheet for Patients: SugarRoll.be Fact Sheet for Healthcare Providers: https://www.woods-mathews.com/ This test is not yet approved or cleared by the [place] FDA and  has been authorized for detection and/or diagnosis of SARS-CoV-2 by FDA under an Emergency Use Authorization (EUA). This EUA will remain  in effect (meaning this test can be used) for the duration of the COVID-19 declaration under Section 56 4(b)(1) of the Act, 21 U.S.C. section 360bbb-3(b)(1), unless the authorization is terminated or revoked sooner. Performed at [facility], [place] [address], [place], [place] [address]      Labs: Basic Metabolic Panel: Recent Labs  Lab 01/05/19 2219 01/06/19 0033 01/06/19 0603 01/06/19 1819 01/07/19 0354  NA 136  --  135 139 139  K 2.1*  --  2.3* 3.7 3.2*  CL 85*  --  87* 98 100  CO2 31  --  33* 29 30  GLUCOSE 109*  --  110* 103* 105*  BUN 13  --  _0 CREATININE 1.63*  --  1.22* 1.11* 1.08*  CALCIUM 9.4  --  8.6* 8.5* 8.5*  MG  --  1.6* 2.3  --  1.8   Liver Function Tests: Recent Labs  Lab 01/06/19 0033 01/06/19 0603 01/07/19 0354  AST 82* 69* 90*  ALT 29 26 34  ALKPHOS 94 87 78  BILITOT 1.9* 1.5* 1.1  PROT 7.0 6.3* 5.6*  ALBUMIN 3.4* 3.1* 2.7*   No results for input(s): LIPASE, AMYLASE in the last 168 hours. No results for input(s): AMMONIA in the last 168 hours. CBC: Recent Labs  Lab 01/05/19 2219 01/06/19 0603 01/07/19 0354  WBC 17.2* 17.9* 12.2*  HGB 13.1 11.5* 10.4*  HCT 38.4 33.7*  31.4*  MCV 77.4* 77.5* 79.9*  PLT 366 310 289   Cardiac Enzymes: No results for input(s): CKTOTAL, CKMB, CKMBINDEX, TROPONINI in the last 168 hours. BNP: BNP (last 3 results) No results for input(s): BNP in the last 8760 hours.  ProBNP (last 3 results) No results for input(s): PROBNP in the last 8760 hours.  CBG: No results for input(s): GLUCAP in the last 168 hours.     SignedRadene Gunning NP [facility] 01/07/2019, 11:38 AM

## 2019-01-07 NOTE — TOC Transition Note (Signed)
Transition of Care [facility]) - CM/SW Discharge Note   Patient Details  Name: RUSTINA DOHRMAN MRN: [id number] Date of Birth: 1971-02-15  [facility] CM/SW Contact:  Zenon Mayo, RN Phone Number: 01/07/2019, 6:42 PM   Clinical Narrative:    Patient for dc today, pt rec hhpt, NCM offered choice, Patient chose [facility], [place], [place], no staff available,  NCM continue to call and try other agencies on Medicare choice list, still no HHPT staff availble, NCM asked patient if she would like to do outpt vestibular PT , she stated sure, go ahead and set her up for that. NCM set patient up thru epic for outpt vestibular physical therapy on [facility]. NCM informed patient if she has not heard from them by Tuesday to give them a call.   Final next level of care: OP Rehab Barriers to Discharge: No Barriers Identified   Patient Goals and CMS Choice Patient states their goals for this hospitalization and ongoing recovery are:: go home   Choice offered to / list presented to : NA  Discharge Placement                       Discharge Plan and Services                DME Arranged: (NA)         HH Arranged: NA          Social Determinants of Health (SDOH) Interventions     Readmission Risk Interventions No flowsheet data found.

## 2019-01-07 NOTE — Progress Notes (Signed)
Pt voiced that she has new onset of numbness in bilateral le. She states "It has started since this admission" also stated that "I have to hold onto things to keep from falling" PT in to evaluate. Will continue to monitor.

## 2019-01-11 ENCOUNTER — Ambulatory Visit: Payer: Self-pay | Admitting: Family Medicine

## 2019-03-08 ENCOUNTER — Ambulatory Visit: Payer: Commercial Managed Care - PPO | Admitting: Family Medicine

## 2021-07-25 ENCOUNTER — Other Ambulatory Visit: Payer: Self-pay

## 2021-07-25 ENCOUNTER — Encounter (HOSPITAL_COMMUNITY): Payer: Self-pay | Admitting: Emergency Medicine

## 2021-07-25 ENCOUNTER — Inpatient Hospital Stay (HOSPITAL_COMMUNITY)
Admission: EM | Admit: 2021-07-25 | Discharge: 2021-07-30 | DRG: 369 | Disposition: A | Discharge status: Home / Self Care | Payer: Self-pay | Attending: Orthopedic Surgery | Admitting: Orthopedic Surgery

## 2021-07-25 DIAGNOSIS — Z9851 Tubal ligation status: Secondary | ICD-10-CM

## 2021-07-25 DIAGNOSIS — R251 Tremor, unspecified: Secondary | ICD-10-CM | POA: Diagnosis present

## 2021-07-25 DIAGNOSIS — R0609 Other forms of dyspnea: Secondary | ICD-10-CM

## 2021-07-25 DIAGNOSIS — F17210 Nicotine dependence, cigarettes, uncomplicated: Secondary | ICD-10-CM | POA: Diagnosis present

## 2021-07-25 DIAGNOSIS — K219 Gastro-esophageal reflux disease without esophagitis: Secondary | ICD-10-CM | POA: Diagnosis present

## 2021-07-25 DIAGNOSIS — K922 Gastrointestinal hemorrhage, unspecified: Secondary | ICD-10-CM | POA: Diagnosis present

## 2021-07-25 DIAGNOSIS — Z20822 Contact with and (suspected) exposure to covid-19: Secondary | ICD-10-CM | POA: Diagnosis present

## 2021-07-25 DIAGNOSIS — K709 Alcoholic liver disease, unspecified: Secondary | ICD-10-CM | POA: Diagnosis present

## 2021-07-25 DIAGNOSIS — K222 Esophageal obstruction: Secondary | ICD-10-CM | POA: Diagnosis present

## 2021-07-25 DIAGNOSIS — K3189 Other diseases of stomach and duodenum: Secondary | ICD-10-CM | POA: Diagnosis present

## 2021-07-25 DIAGNOSIS — Z79899 Other long term (current) drug therapy: Secondary | ICD-10-CM

## 2021-07-25 DIAGNOSIS — F1010 Alcohol abuse, uncomplicated: Secondary | ICD-10-CM | POA: Diagnosis present

## 2021-07-25 DIAGNOSIS — R000 Tachycardia, unspecified: Secondary | ICD-10-CM | POA: Diagnosis present

## 2021-07-25 DIAGNOSIS — Z9109 Other allergy status, other than to drugs and biological substances: Secondary | ICD-10-CM

## 2021-07-25 DIAGNOSIS — K2101 Gastro-esophageal reflux disease with esophagitis, with bleeding: Principal | ICD-10-CM | POA: Diagnosis present

## 2021-07-25 DIAGNOSIS — E785 Hyperlipidemia, unspecified: Secondary | ICD-10-CM | POA: Diagnosis present

## 2021-07-25 DIAGNOSIS — I10 Essential (primary) hypertension: Secondary | ICD-10-CM | POA: Diagnosis present

## 2021-07-25 DIAGNOSIS — K209 Esophagitis, unspecified without bleeding: Secondary | ICD-10-CM | POA: Diagnosis present

## 2021-07-25 DIAGNOSIS — E876 Hypokalemia: Secondary | ICD-10-CM | POA: Diagnosis present

## 2021-07-25 DIAGNOSIS — R6883 Chills (without fever): Secondary | ICD-10-CM | POA: Diagnosis present

## 2021-07-25 DIAGNOSIS — K920 Hematemesis: Secondary | ICD-10-CM

## 2021-07-25 DIAGNOSIS — R531 Weakness: Secondary | ICD-10-CM

## 2021-07-25 DIAGNOSIS — K760 Fatty (change of) liver, not elsewhere classified: Secondary | ICD-10-CM | POA: Diagnosis present

## 2021-07-25 NOTE — ED Triage Notes (Addendum)
Pt is an alcoholic who started vomiting blood 3 days ago, in triage shows this RN blood tinged emesis but states that she was experiencing larger volumes of blood at home. Drinks a fifth a day, 3pm last drink. Unable to keep down fluids.  ? ?Endorses L abd pain, chills.  ? ?Denies previous GI bleeding.  ? ? ?

## 2021-07-26 ENCOUNTER — Encounter (HOSPITAL_COMMUNITY): Payer: Self-pay | Admitting: Internal Medicine

## 2021-07-26 ENCOUNTER — Emergency Department (HOSPITAL_COMMUNITY): Payer: Self-pay

## 2021-07-26 DIAGNOSIS — K2921 Alcoholic gastritis with bleeding: Secondary | ICD-10-CM

## 2021-07-26 DIAGNOSIS — R7989 Other specified abnormal findings of blood chemistry: Secondary | ICD-10-CM

## 2021-07-26 DIAGNOSIS — K219 Gastro-esophageal reflux disease without esophagitis: Secondary | ICD-10-CM | POA: Diagnosis present

## 2021-07-26 DIAGNOSIS — K922 Gastrointestinal hemorrhage, unspecified: Secondary | ICD-10-CM | POA: Diagnosis present

## 2021-07-26 DIAGNOSIS — R1084 Generalized abdominal pain: Secondary | ICD-10-CM

## 2021-07-26 DIAGNOSIS — R112 Nausea with vomiting, unspecified: Secondary | ICD-10-CM

## 2021-07-26 LAB — COMPREHENSIVE METABOLIC PANEL WITH GFR
ALT: 53 U/L — ABNORMAL HIGH (ref 0–44)
AST: 126 U/L — ABNORMAL HIGH (ref 15–41)
Albumin: 3.5 g/dL (ref 3.5–5.0)
Alkaline Phosphatase: 132 U/L — ABNORMAL HIGH (ref 38–126)
Anion gap: 20 — ABNORMAL HIGH (ref 5–15)
BUN: <5 mg/dL — ABNORMAL LOW (ref 6–20)
CO2: 24 mmol/L (ref 22–32)
Calcium: 9.3 mg/dL (ref 8.9–10.3)
Chloride: 95 mmol/L — ABNORMAL LOW (ref 98–111)
Creatinine, Ser: 0.79 mg/dL (ref 0.44–1.00)
GFR, Estimated: >60 mL/min
Glucose, Bld: 137 mg/dL — ABNORMAL HIGH (ref 70–99)
Potassium: 2.7 mmol/L — CL (ref 3.5–5.1)
Sodium: 139 mmol/L (ref 135–145)
Total Bilirubin: 2.5 mg/dL — ABNORMAL HIGH (ref 0.3–1.2)
Total Protein: 7.1 g/dL (ref 6.5–8.1)

## 2021-07-26 LAB — BASIC METABOLIC PANEL
Anion gap: 15 (ref 5–15)
Anion gap: 11 (ref 5–15)
BUN: <5 mg/dL — ABNORMAL LOW (ref 6–20)
BUN: <5 mg/dL — ABNORMAL LOW (ref 6–20)
CO2: 25 mmol/L (ref 22–32)
CO2: 28 mmol/L (ref 22–32)
Calcium: 8.5 mg/dL — ABNORMAL LOW (ref 8.9–10.3)
Calcium: 8.1 mg/dL — ABNORMAL LOW (ref 8.9–10.3)
Chloride: 97 mmol/L — ABNORMAL LOW (ref 98–111)
Chloride: 100 mmol/L (ref 98–111)
Creatinine, Ser: 0.88 mg/dL (ref 0.44–1.00)
Creatinine, Ser: 0.78 mg/dL (ref 0.44–1.00)
GFR, Estimated: >60 mL/min (ref >60)
GFR, Estimated: >60 mL/min (ref >60)
Glucose, Bld: 97 mg/dL (ref 70–99)
Glucose, Bld: 95 mg/dL (ref 70–99)
Potassium: 3.2 mmol/L — ABNORMAL LOW (ref 3.5–5.1)
Potassium: 3.2 mmol/L — ABNORMAL LOW (ref 3.5–5.1)
Sodium: 137 mmol/L (ref 135–145)
Sodium: 139 mmol/L (ref 135–145)

## 2021-07-26 LAB — PROTIME-INR
INR: 1.1 (ref 0.8–1.2)
Prothrombin Time: 14.1 seconds (ref 11.4–15.2)

## 2021-07-26 LAB — CBC
HCT: 42.4 % (ref 36.0–46.0)
Hemoglobin: 15.9 g/dL — ABNORMAL HIGH (ref 12.0–15.0)
MCH: 35.3 pg — ABNORMAL HIGH (ref 26.0–34.0)
MCHC: 37.5 g/dL — ABNORMAL HIGH (ref 30.0–36.0)
MCV: 94.2 fL (ref 80.0–100.0)
Platelets: 197 10*3/uL (ref 150–400)
RBC: 4.5 MIL/uL (ref 3.87–5.11)
RDW: 15.4 % (ref 11.5–15.5)
WBC: 13.4 10*3/uL — ABNORMAL HIGH (ref 4.0–10.5)
nRBC: 0.3 % — ABNORMAL HIGH (ref 0.0–0.2)

## 2021-07-26 LAB — CBC WITH DIFFERENTIAL/PLATELET
Abs Immature Granulocytes: 0.18 K/uL — ABNORMAL HIGH (ref 0.00–0.07)
Basophils Absolute: 0.1 K/uL (ref 0.0–0.1)
Basophils Relative: 0 %
Eosinophils Absolute: 0 K/uL (ref 0.0–0.5)
Eosinophils Relative: 0 %
HCT: 37.6 % (ref 36.0–46.0)
Hemoglobin: 13.9 g/dL (ref 12.0–15.0)
Immature Granulocytes: 1 %
Lymphocytes Relative: 15 %
Lymphs Abs: 2.3 K/uL (ref 0.7–4.0)
MCH: 34.9 pg — ABNORMAL HIGH (ref 26.0–34.0)
MCHC: 37 g/dL — ABNORMAL HIGH (ref 30.0–36.0)
MCV: 94.5 fL (ref 80.0–100.0)
Monocytes Absolute: 1.1 K/uL — ABNORMAL HIGH (ref 0.1–1.0)
Monocytes Relative: 7 %
Neutro Abs: 11.7 K/uL — ABNORMAL HIGH (ref 1.7–7.7)
Neutrophils Relative %: 77 %
Platelets: 214 K/uL (ref 150–400)
RBC: 3.98 MIL/uL (ref 3.87–5.11)
RDW: 15.5 % (ref 11.5–15.5)
WBC: 15.3 K/uL — ABNORMAL HIGH (ref 4.0–10.5)
nRBC: 0 % (ref 0.0–0.2)

## 2021-07-26 LAB — TYPE AND SCREEN
ABO/RH(D): O POS
Antibody Screen: NEGATIVE

## 2021-07-26 LAB — MAGNESIUM
Magnesium: 1.4 mg/dL — ABNORMAL LOW (ref 1.7–2.4)
Magnesium: 1.8 mg/dL (ref 1.7–2.4)
Magnesium: 1.8 mg/dL (ref 1.7–2.4)

## 2021-07-26 LAB — ABO/RH: ABO/RH(D): O POS

## 2021-07-26 LAB — LIPASE, BLOOD: Lipase: 26 U/L (ref 11–51)

## 2021-07-26 LAB — RESP PANEL BY RT-PCR (FLU A&B, COVID) ARPGX2
Influenza A by PCR: NEGATIVE
Influenza B by PCR: NEGATIVE
SARS Coronavirus 2 by RT PCR: NEGATIVE

## 2021-07-26 LAB — HCG, QUANTITATIVE, PREGNANCY: hCG, Beta Chain, Quant, S: 1 m[IU]/mL

## 2021-07-26 LAB — HIV ANTIBODY (ROUTINE TESTING W REFLEX): HIV Screen 4th Generation wRfx: NONREACTIVE

## 2021-07-26 MED ORDER — THIAMINE HCL 100 MG/ML IJ SOLN
100.0000 mg | Freq: Every day | INTRAMUSCULAR | Status: DC
  Administered 2021-07-28: 100 mg via INTRAVENOUS
  Filled 2021-07-26 (×2): qty 2

## 2021-07-26 MED ORDER — LORAZEPAM 2 MG/ML IJ SOLN
1.0000 mg | INTRAMUSCULAR | Status: DC | PRN
  Administered 2021-07-26: 2 mg via INTRAVENOUS
  Administered 2021-07-26 (×2): 1 mg via INTRAVENOUS
  Administered 2021-07-26: 2 mg via INTRAVENOUS
  Administered 2021-07-27: 1 mg via INTRAVENOUS
  Administered 2021-07-27: 2 mg via INTRAVENOUS
  Filled 2021-07-26: qty 1
  Filled 2021-07-26: qty 2
  Filled 2021-07-26 (×6): qty 1

## 2021-07-26 MED ORDER — ONDANSETRON HCL 4 MG/2ML IJ SOLN
4.0000 mg | Freq: Once | INTRAMUSCULAR | Status: AC
  Administered 2021-07-26: 4 mg via INTRAVENOUS
  Filled 2021-07-26: qty 2

## 2021-07-26 MED ORDER — MORPHINE SULFATE (PF) 2 MG/ML IV SOLN
2.0000 mg | INTRAVENOUS | Status: DC | PRN
  Administered 2021-07-27: 2 mg via INTRAVENOUS
  Filled 2021-07-26 (×2): qty 1

## 2021-07-26 MED ORDER — SODIUM CHLORIDE 0.9% FLUSH
3.0000 mL | Freq: Two times a day (BID) | INTRAVENOUS | Status: DC
  Administered 2021-07-28 – 2021-07-30 (×5): 3 mL via INTRAVENOUS

## 2021-07-26 MED ORDER — PANTOPRAZOLE SODIUM 40 MG IV SOLR
40.0000 mg | Freq: Once | INTRAVENOUS | Status: AC
  Administered 2021-07-26: 40 mg via INTRAVENOUS
  Filled 2021-07-26: qty 10

## 2021-07-26 MED ORDER — TRAZODONE HCL 50 MG PO TABS: 50.0000 mg | ORAL_TABLET | Freq: Every evening | ORAL | Status: DC | PRN

## 2021-07-26 MED ORDER — POTASSIUM CHLORIDE 10 MEQ/100ML IV SOLN
10.0000 meq | INTRAVENOUS | Status: AC
  Administered 2021-07-26 (×3): 10 meq via INTRAVENOUS
  Filled 2021-07-26 (×3): qty 100

## 2021-07-26 MED ORDER — OXYCODONE HCL 5 MG PO TABS
5.0000 mg | ORAL_TABLET | ORAL | Status: DC | PRN
  Administered 2021-07-28 – 2021-07-29 (×2): 5 mg via ORAL
  Filled 2021-07-26 (×2): qty 1

## 2021-07-26 MED ORDER — LORAZEPAM 2 MG/ML IJ SOLN
1.0000 mg | Freq: Once | INTRAMUSCULAR | Status: AC
  Administered 2021-07-26: 1 mg via INTRAVENOUS
  Filled 2021-07-26: qty 1

## 2021-07-26 MED ORDER — PANTOPRAZOLE SODIUM 40 MG IV SOLR
40.0000 mg | Freq: Two times a day (BID) | INTRAVENOUS | Status: DC
  Administered 2021-07-26 (×2): 40 mg via INTRAVENOUS
  Filled 2021-07-26 (×2): qty 10

## 2021-07-26 MED ORDER — ADULT MULTIVITAMIN W/MINERALS CH
1.0000 | ORAL_TABLET | Freq: Every day | ORAL | Status: DC
  Administered 2021-07-26: 1 via ORAL
  Filled 2021-07-26: qty 1

## 2021-07-26 MED ORDER — SODIUM CHLORIDE 0.9 % IV SOLN: INTRAVENOUS | Status: DC

## 2021-07-26 MED ORDER — LORAZEPAM 1 MG PO TABS: 1.0000 mg | ORAL_TABLET | ORAL | Status: DC | PRN

## 2021-07-26 MED ORDER — FOLIC ACID 1 MG PO TABS
1.0000 mg | ORAL_TABLET | Freq: Every day | ORAL | Status: DC
  Administered 2021-07-26 – 2021-07-30 (×5): 1 mg via ORAL
  Filled 2021-07-26 (×5): qty 1

## 2021-07-26 MED ORDER — POTASSIUM CHLORIDE IN NACL 40-0.9 MEQ/L-% IV SOLN
INTRAVENOUS | Status: DC
  Filled 2021-07-26 (×6): qty 1000

## 2021-07-26 MED ORDER — POTASSIUM CHLORIDE 10 MEQ/100ML IV SOLN
10.0000 meq | INTRAVENOUS | Status: AC
  Administered 2021-07-26 (×4): 10 meq via INTRAVENOUS
  Filled 2021-07-26 (×3): qty 100

## 2021-07-26 MED ORDER — NICOTINE 21 MG/24HR TD PT24
21.0000 mg | MEDICATED_PATCH | Freq: Every day | TRANSDERMAL | Status: DC
  Administered 2021-07-28 – 2021-07-30 (×3): 21 mg via TRANSDERMAL
  Filled 2021-07-26 (×5): qty 1

## 2021-07-26 MED ORDER — MAGNESIUM SULFATE 2 GM/50ML IV SOLN
2.0000 g | Freq: Once | INTRAVENOUS | Status: AC
  Administered 2021-07-26: 2 g via INTRAVENOUS
  Filled 2021-07-26: qty 50

## 2021-07-26 MED ORDER — POLYETHYLENE GLYCOL 3350 17 G PO PACK: 17.0000 g | PACK | Freq: Every day | ORAL | Status: DC | PRN

## 2021-07-26 MED ORDER — AMLODIPINE BESYLATE 5 MG PO TABS
10.0000 mg | ORAL_TABLET | Freq: Once | ORAL | Status: AC
  Administered 2021-07-26: 10 mg via ORAL
  Filled 2021-07-26: qty 2

## 2021-07-26 MED ORDER — PROCHLORPERAZINE EDISYLATE 10 MG/2ML IJ SOLN
10.0000 mg | INTRAMUSCULAR | Status: DC | PRN
Start: 2021-07-26 — End: 2021-07-30
  Administered 2021-07-26 – 2021-07-28 (×3): 10 mg via INTRAVENOUS
  Filled 2021-07-26 (×4): qty 2

## 2021-07-26 MED ORDER — IOHEXOL 300 MG/ML  SOLN
80.0000 mL | Freq: Once | INTRAMUSCULAR | Status: AC | PRN
  Administered 2021-07-26: 80 mL via INTRAVENOUS

## 2021-07-26 MED ORDER — FENTANYL CITRATE PF 50 MCG/ML IJ SOSY
100.0000 ug | PREFILLED_SYRINGE | Freq: Once | INTRAMUSCULAR | Status: AC
  Administered 2021-07-26: 100 ug via INTRAVENOUS
  Filled 2021-07-26: qty 2

## 2021-07-26 MED ORDER — LACTATED RINGERS IV BOLUS
1000.0000 mL | Freq: Once | INTRAVENOUS | Status: AC
  Administered 2021-07-26: 1000 mL via INTRAVENOUS

## 2021-07-26 MED ORDER — PANTOPRAZOLE SODIUM 40 MG IV SOLR: 40.0000 mg | Freq: Two times a day (BID) | INTRAVENOUS | Status: DC

## 2021-07-26 MED ORDER — THIAMINE HCL 100 MG PO TABS
100.0000 mg | ORAL_TABLET | Freq: Every day | ORAL | Status: DC
  Administered 2021-07-26 – 2021-07-30 (×4): 100 mg via ORAL
  Filled 2021-07-26 (×5): qty 1

## 2021-07-26 MED ORDER — LABETALOL HCL 5 MG/ML IV SOLN
10.0000 mg | Freq: Once | INTRAVENOUS | Status: AC
  Administered 2021-07-26: 10 mg via INTRAVENOUS
  Filled 2021-07-26: qty 4

## 2021-07-26 MED ORDER — KETOROLAC TROMETHAMINE 15 MG/ML IJ SOLN
15.0000 mg | Freq: Once | INTRAMUSCULAR | Status: AC
  Administered 2021-07-26: 15 mg via INTRAVENOUS
  Filled 2021-07-26: qty 1

## 2021-07-26 MED ORDER — HYDROMORPHONE HCL 1 MG/ML IJ SOLN: 1.0000 mg | Freq: Once | INTRAMUSCULAR | Status: DC

## 2021-07-26 NOTE — H&P (Signed)
?History and Physical  ? ? ?Sharon Hunt:[id number] DOB: [birth year] DOA: 07/25/2021 ? ?PCP: Dorena Dew, FNP  ?Patient coming from: Home ? ?I have personally briefly reviewed patient's old medical records in [place] ? ?Chief Complaint: Vomiting blood heavily 3 days ago which has tapered down to tinges. ? ?HPI: Sharon Hunt is a 51 y.o. female with medical history significant of alcohol abuse, gastroesophageal reflux disease, hypertension, tobacco abuse, hyperlipidemia, abnormal uterine bleeding with leiomyoma of the uterus, and QT prolongation who presents to the emergency department with complaints of vomiting blood 3 days ago.  In triage she showed the nurse some blood-tinged emesis but states that she was experiencing large volumes of blood at home.  She drinks 1/5 of alcohol a day and her last drink was at 3 PM yesterday.  She is unable to keep down fluids.  She complains of abdominal pain and chills.  On my examination she is alert and awake experiencing chills and tremors.  Her skin feels warm and her temperature is slightly elevated axillary.  She reports drinking daily for the past 5 to 6 years.  She had one short period of time where she was sober for about 4 to 5 months a couple of years ago.  She often goes into withdrawal.  She will go periods of time without eating or drinking normally just consuming alcohol.  She has a history of gastritis and reflux related to a hiatal hernia and perhaps contributed to by the alcohol use.  In 2012 she had an EGD.  She had no varices at that time but had not been a regular drinker back then.  She is compliant with her medications and does not take any blood thinners.  Presents with concerns about vomiting blood. ? ?ED Course: Vital signs were normal noted to be elevated with blood pressure elevated respirations elevated and intermittent tachycardia in the emergency department.  Potassium was noted to be low at 2.7 magnesium low at 1.4, blood  cell count elevated at 15, Lifteez elevated with mild transaminitis.  EKG showed sinus tachycardia and right atrial enlargement.Chest x-ray was unremarkable, CT scan of the abdomen and pelvis showed no bowel obstruction or free air, mild bladder wall thickening with infection or inflammatory cystitis, marked hepatic steatosis.  Referred to me for further evaluation of possible GI bleeding and prevention of possible alcohol withdrawal ? ?Review of Systems: As per HPI otherwise all other systems reviewed and  negative.  ? ?Past Medical History:  ?Diagnosis Date  ? H/O hiatal hernia   ? Hypertension   ? ? ?Past Surgical History:  ?Procedure Laterality Date  ? KNEE SURGERY    ? TONSILLECTOMY    ? TUBAL LIGATION    ? ? ?Social History  ? ?Social History Narrative  ? Not on file  ? ? ? reports that she has been smoking cigarettes. She has been smoking an average of 1 pack per day. She does not have any smokeless tobacco history on file. She reports that she does not drink alcohol and does not use drugs. ? ?Allergies  ?Allergen Reactions  ? Coconut Flavor Itching  ? ? ?No family history on file. ?Family history reviewed and found to be noncontributory ? ?Prior to Admission medications   ?Medication Sig Start Date End Date Taking? Authorizing Provider  ?aluminum-magnesium hydroxide-simethicone (MAALOX) [phone] MG/5ML SUSP Take 30 mLs by mouth in the morning and at bedtime.   Yes [provider]  ?diphenhydrAMINE (BENADRYL) 25  MG tablet Take 50 mg by mouth every 6 (six) hours as needed for allergies.   Yes [provider]  ?omeprazole (PRILOSEC OTC) 20 MG tablet Take 20 mg by mouth 2 (two) times daily as needed.   Yes [provider]  ?amLODipine (NORVASC) 10 MG tablet Take 0.5 tablets (5 mg total) by mouth daily. ?Patient not taking: Reported on 07/26/2021 04/04/15   Dorena Dew, FNP  ?B Complex-C (B-COMPLEX WITH VITAMIN C) tablet Take 1 tablet by mouth daily. ?Patient not taking: Reported  on 07/26/2021 01/07/19   Radene Gunning, NP  ?clotrimazole (GYNE-LOTRIMIN) 1 % vaginal cream Place 1 Applicatorful vaginally at bedtime. ?Patient not taking: Reported on 07/26/2021 01/07/19   Geradine Girt, DO  ?folic acid (FOLVITE) 1 MG tablet Take 1 tablet (1 mg total) by mouth daily. ?Patient not taking: Reported on 07/26/2021 01/08/19   Radene Gunning, NP  ?gabapentin (NEURONTIN) 100 MG capsule Take 1 capsule (100 mg total) by mouth 3 (three) times daily. ?Patient not taking: Reported on 07/26/2021 01/07/19   Radene Gunning, NP  ?magnesium oxide (MAG-OX) 400 (241.3 Mg) MG tablet Take 1 tablet (400 mg total) by mouth daily. ?Patient not taking: Reported on 07/26/2021 01/07/19   Radene Gunning, NP  ?Multiple Vitamin (MULTIVITAMIN WITH MINERALS) TABS tablet Take 1 tablet by mouth daily. ?Patient not taking: Reported on 07/26/2021 01/08/19   Radene Gunning, NP  ?omeprazole (PRILOSEC) 40 MG capsule Take 40 mg by mouth daily. ?Patient not taking: Reported on 07/26/2021 12/24/18   [provider]  ?thiamine 100 MG tablet Take 1 tablet (100 mg total) by mouth daily. ?Patient not taking: Reported on 07/26/2021 01/08/19   Radene Gunning, NP  ? ? ?Physical Exam: ? ?Constitutional: NAD, calm, uncomfortable, tremoring ?Vitals:  ? 07/26/21 0908 07/26/21 1119 07/26/21 1200 07/26/21 1247  ?BP: (!) 155/88 (!) 155/88 132/71 132/72  ?Pulse: (!) 103 (!) 103 91 91  ?Resp:   (!) 21   ?Temp:      ?TempSrc:      ?SpO2:   95%   ?Weight:      ?Height:      ? ?Eyes: PERRL, lids and conjunctivae normal ?ENMT: Mucous membranes are dry posterior pharynx clear of any exudate or lesions.Normal dentition.  ?Neck: normal, supple, no masses, no thyromegaly ?Respiratory: clear to auscultation bilaterally, no wheezing, no crackles. Normal respiratory effort. No accessory muscle use.  ?Cardiovascular: Regular rate and rhythm, no murmurs / rubs / gallops. No extremity edema. 2+ pedal pulses. No carotid bruits.  ?Abdomen: Diffuse tenderness, no  masses palpated. No splenomegaly.  Pedal megaly appreciated to 4 cm below the costal margin.  Bowel sounds positive.  No rebound or guarding ?Musculoskeletal: no clubbing / cyanosis. No joint deformity upper and lower extremities. Good ROM, no contractures. Normal muscle tone.  ?Skin: no rashes, lesions, ulcers. No induration ?Neurologic: CN 2-12 grossly intact. Sensation intact, DTR normal. Strength 5/5 in all 4.  Tension and resting tremor of the upper extremities noted no clonus ?Psychiatric: Normal judgment and insight. Alert and oriented x 3. Normal mood.  ? ? ?Labs on Admission: I have personally reviewed following labs and imaging studies ? ?CBC: ?Recent Labs  ?Lab 07/26/21 ?4982  ?WBC 15.3*  ?NEUTROABS 11.7*  ?HGB 13.9  ?HCT 37.6  ?MCV 94.5  ?PLT 214  ? ?Basic Metabolic Panel: ?Recent Labs  ?Lab 07/26/21 ?0119 07/26/21 ?6415  ?NA 139 137  ?K 2.7* 3.2*  ?CL 95* 97*  ?  CO2 24 25  ?GLUCOSE 137* 97  ?BUN <5* <5*  ?CREATININE 0.79 0.88  ?CALCIUM 9.3 8.5*  ?MG 1.4* 1.8  ? ?GFR: ?Estimated Creatinine Clearance: 78.8 mL/min (by C-G formula based on SCr of 0.88 mg/dL). ?Liver Function Tests: ?Recent Labs  ?Lab 07/26/21 ?0119  ?AST 126*  ?ALT 53*  ?ALKPHOS 132*  ?BILITOT 2.5*  ?PROT 7.1  ?ALBUMIN 3.5  ? ?Recent Labs  ?Lab 07/26/21 ?0119  ?LIPASE 26  ? ?No results for input(s): AMMONIA in the last 168 hours. ?Coagulation Profile: ?Recent Labs  ?Lab 07/26/21 ?0611  ?INR 1.1  ? ?Urine analysis: ?   ?Component Value Date/Time  ? COLORURINE AMBER (A) 01/06/2019 1037  ? APPEARANCEUR CLOUDY (A) 01/06/2019 1037  ? LABSPEC 1.021 01/06/2019 1037  ? PHURINE 5.0 01/06/2019 1037  ? GLUCOSEU NEGATIVE 01/06/2019 1037  ? [place] NEGATIVE 01/06/2019 1037  ? [place] NEGATIVE 01/06/2019 1037  ? [place] NEGATIVE 01/06/2019 1037  ? [place] NEGATIVE 01/06/2019 1037  ? UROBILINOGEN 0.2 04/04/2015 1223  ? NITRITE NEGATIVE 01/06/2019 1037  ? LEUKOCYTESUR MODERATE (A) 01/06/2019 1037  ? ? ?Radiological Exams on Admission: ?DG Chest 2  View ? ?Result Date: 07/26/2021 ?CLINICAL DATA:  Cough EXAM: CHEST - 2 VIEW COMPARISON:  01/05/2019 FINDINGS: The heart size and mediastinal contours are within normal limits. Both lungs are clear. The visualize

## 2021-07-26 NOTE — ED Notes (Signed)
CRITICAL POTASSIUM REPORTED BY LAB: 2.7. MESNER MD NOTIFIED.  ?

## 2021-07-26 NOTE — H&P (View-Only) (Signed)
[facility] Consult Note ? ? ?History ?Sharon Hunt ?MRN # [id number] ? ?Date of Admission: 07/25/2021 ?Date of Consultation: 07/26/2021 ?Referring physician: Dr. Lady Deutscher, MD ?Primary Care Provider: Dorena Dew, FNP ?Primary Gastroenterologist: None -unassigned ? ? ?Reason for Consultation/Chief Complaint: Hematemesis ? ?Subjective  ?HPI: ? ?This is a 51 year old woman who presented to the ED today for worsening nausea and vomiting and development of small-volume hematemesis.  She describes herself as a "daily drinker", and says that amount of daily ingestion has recently increased.  She has been having dyspepsia, belching, heartburn symptoms and nausea and vomiting for at least several days.  After vomiting a few times she saw bright red blood and then coffee-ground material, she became concerned and came to the ED.  There is been no further hematemesis since arrival and no passage of melena, and she denies passing black tarry stool or bright red blood per rectum at home. ?She is also describing generalized abdominal pain though more toward the left side in the last day or 2. ?She is markedly hypokalemic and initial hemoglobin is normal.  She is being admitted for medical management and we are consulted regarding the bleeding. ?It does not sound like she gets regular medical care. ? ?ROS: ? ?Constitutional: She believes she has lost some weight but uncertain how much or over what period of time ? ? ?All other systems are negative except as noted above in the HPI ? ?Past Medical History ?Past Medical History:  ?Diagnosis Date  ? H/O hiatal hernia   ? Hypertension   ? ? ?Past Surgical History ?Past Surgical History:  ?Procedure Laterality Date  ? KNEE SURGERY    ? TONSILLECTOMY    ? TUBAL LIGATION    ? ? ?Family History ?No family history on file. ?No known family history of liver disease ?Social History ?Social History  ? ?Socioeconomic History  ? Marital status: Single  ?  Spouse  name: Not on file  ? Number of children: Not on file  ? Years of education: Not on file  ? Highest education level: Not on file  ?Occupational History  ? Not on file  ?Tobacco Use  ? Smoking status: Every Day  ?  Packs/day: 1.00  ?  Types: Cigarettes  ? Smokeless tobacco: Not on file  ?Substance and Sexual Activity  ? Alcohol use: No  ?  Alcohol/week: 4.0 standard drinks  ?  Types: 4 Cans of beer per week  ?  Comment: y  ? Drug use: No  ? Sexual activity: Never  ?Other Topics Concern  ? Not on file  ?Social History Narrative  ? Not on file  ? ?Social Determinants of Health  ? ?Financial Resource Strain: Not on file  ?Food Insecurity: Not on file  ?Transportation Needs: Not on file  ?Physical Activity: Not on file  ?Stress: Not on file  ?Social Connections: Not on file  ? ?She drinks alcohol daily, stating "heavy" ? ?Allergies ?Allergies  ?Allergen Reactions  ? Coconut Flavor Itching  ? ? ?Outpatient Meds ?Home medications from the H+P and/or nursing med reconciliation reviewed. ? ?Inpatient med list reviewed ? ?_____________________________________________________________________ ?Objective  ? ?Exam: ? ?Current vital signs ? ?Patient Vitals for the past 8 hrs: ? BP Pulse Resp SpO2 Height  ?07/26/21 0908 (!) 155/88 (!) 103 -- -- --  ?07/26/21 0806 (!) 189/96 (!) 103 -- -- --  ?07/26/21 0800 -- -- -- -- '5\' 5"'$  (1.651 m)  ?07/26/21 0700 (!) 189/96 88 Marland Kitchen)  24 96 % --  ?07/26/21 0630 (!) 181/100 91 (!) 25 99 % --  ?07/26/21 0615 (!) 173/105 95 (!) 27 96 % --  ?07/26/21 0600 (!) 174/89 92 (!) 24 98 % --  ?07/26/21 0545 (!) 171/89 93 16 96 % --  ?07/26/21 0530 (!) 175/93 91 (!) 24 96 % --  ?07/26/21 0500 (!) 185/95 90 18 99 % --  ?07/26/21 0445 (!) 204/112 82 15 96 % --  ?07/26/21 0430 (!) 187/96 82 13 96 % --  ?07/26/21 0300 (!) 215/118 98 (!) 22 98 % --  ?07/26/21 0216 (!) 202/102 91 18 99 % --  ? ? ?Intake/Output Summary (Last 24 hours) at 07/26/2021 1009 ?Last data filed at 07/26/2021 5916 ?Gross per 24 hour  ?Intake 150  ml  ?Output --  ?Net 150 ml  ? ? ?Physical Exam: ? ? ?General: this is a female patient who is alert and conversational and pleasant.  She is laying on her right side because that makes her less nauseated. ?Eyes: Mild scleral icterus, no redness ?ENT: oral mucosa moist without lesions, no cervical or supraclavicular lymphadenopathy, ?CV: RRR without murmur, S1/S2, no JVD,, no peripheral edema ?Resp: clear to auscultation bilaterally, normal RR and effort noted ?GI: soft, overweight, epigastric and left upper quadrant tenderness, with active bowel sounds.  Left lobe liver enlarged at least 3 fingerbreadths below costal margin ?Spleen tip could not be palpated due to body habitus and tenderness ?Skin; warm and dry, no rash or jaundice noted ?Neuro: awake, alert and oriented x 3. Normal gross motor function and fluent speech. ? ?Labs: ? ? ?  Latest Ref Rng & Units 07/26/2021  ?  4:15 AM 01/07/2019  ?  3:54 AM 01/06/2019  ?  6:19 PM  ?CBC  ?WBC 4.0 - 10.5 K/uL 15.3   12.2     ?Hemoglobin 12.0 - 15.0 g/dL 13.9   10.4     ?Hematocrit 36.0 - 46.0 % 37.6   31.4   32.1    ?Platelets 150 - 400 K/uL 214   289     ? ? ? ?  Latest Ref Rng & Units 07/26/2021  ?  8:07 AM 07/26/2021  ?  1:19 AM 01/07/2019  ?  3:54 AM  ?CMP  ?Glucose 70 - 99 mg/dL 97   137   105    ?BUN 6 - 20 mg/dL <5   <5   12    ?Creatinine 0.44 - 1.00 mg/dL 0.88   0.79   1.08    ?Sodium 135 - 145 mmol/L 137   139   139    ?Potassium 3.5 - 5.1 mmol/L 3.2   2.7   3.2    ?Chloride 98 - 111 mmol/L 97   95   100    ?CO2 22 - 32 mmol/L '25   24   30    '$ ?Calcium 8.9 - 10.3 mg/dL 8.5   9.3   8.5    ?Total Protein 6.5 - 8.1 g/dL  7.1   5.6    ?Total Bilirubin 0.3 - 1.2 mg/dL  2.5   1.1    ?Alkaline Phos 38 - 126 U/L  132   78    ?AST 15 - 41 U/L  126   90    ?ALT 0 - 44 U/L  53   34    ? ?Lipase 26 ? ?Recent Labs  ?Lab 07/26/21 ?0611  ?INR 1.1  ? ?_________________________________________________________ ?Radiologic studies: ? ?CLINICAL DATA:  Abdominal pain, hematemesis. ?   ?EXAM: ?CT ABDOMEN AND PELVIS WITH CONTRAST ?  ?TECHNIQUE: ?Multidetector CT imaging of the abdomen and pelvis was performed ?using the standard protocol following bolus administration of ?intravenous contrast. ?  ?RADIATION DOSE REDUCTION: This exam was performed according to the ?departmental dose-optimization program which includes automated ?exposure control, adjustment of the mA and/or kV according to ?patient size and/or use of iterative reconstruction technique. ?  ?CONTRAST:  3m OMNIPAQUE IOHEXOL 300 MG/ML  SOLN ?  ?COMPARISON:  03/09/2015. ?  ?FINDINGS: ?Lower chest: No acute abnormality. ?  ?Hepatobiliary: There is marked hepatic steatosis. No focal liver ?abnormality. The gallbladder is without stones. No biliary ductal ?dilatation. ?  ?Pancreas: Unremarkable. No pancreatic ductal dilatation or ?surrounding inflammatory changes. ?  ?Spleen: Normal in size without focal abnormality. ?  ?Adrenals/Urinary Tract: No adrenal nodule or mass. 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 No definite renal calculus. ?Examination is limited due the presence of excreted contrast at the ?renal pyramids. No hydronephrosis. Diffuse bladder wall thickening ?is noted. ?  ?Stomach/Bowel: Stomach is within normal limits. Appendix appears ?normal. No evidence of bowel wall thickening, distention, or ?inflammatory changes. ?  ?Vascular/Lymphatic: No significant vascular findings are present. No ?enlarged abdominal or pelvic lymph nodes. ?  ?Reproductive: Uterus and bilateral adnexa are unremarkable. ?  ?Other: A small amount of free fluid is present in the perirectal ?space on the right. Small fat containing periumbilical hernia. Fat ?containing inguinal hernias are present bilaterally. ?  ?Musculoskeletal: There is sclerosis at the sacroiliac joints ?bilaterally, compatible with sacroiliitis. Degenerative changes are ?noted in the thoracolumbar spine. No acute osseous abnormality. ?  ?IMPRESSION: ?1. No bowel obstruction  or free air. ?2. Mild bladder wall thickening, possible infectious or inflammatory ?cystitis. ?3. Marked hepatic steatosis. ?  ?  ?Electronically Signed ?  By: LBrett FairyM.D. ?  On: 07/26/2021 03:15 ? ?

## 2021-07-26 NOTE — Anesthesia Preprocedure Evaluation (Addendum)
Anesthesia Evaluation  ?Patient identified by MRN, date of birth, ID band ?Patient awake ? ? ? ?Reviewed: ?Allergy & Precautions, NPO status , Patient's Chart, lab work & pertinent test results ? ?Airway ?Mallampati: III ? ?TM Distance: >3 FB ?Neck ROM: Full ? ? ? Dental ? ?(+) Dental Advisory Given, Chipped, Missing ?  ?Pulmonary ?Current Smoker and Patient abstained from smoking.,  ?  ?Pulmonary exam normal ?breath sounds clear to auscultation ? ? ? ? ? ? Cardiovascular ?hypertension, Pt. on medications ?+ DOE  ?Normal cardiovascular exam ?Rhythm:Regular Rate:Normal ? ?'20 ECHO: EF 60-65%. The LV has normal function, mildly increased LVH. impaired relaxation of the LV, normal RVF, no significant valvular abnormalities ?  ?Neuro/Psych ?  ? GI/Hepatic ?GERD  Medicated,(+)  ?  ? substance abuse ? alcohol use, Elevated LFTs ?hematemesis ?  ?Endo/Other  ? ? Renal/GU ?Renal InsufficiencyRenal disease  ? ?  ?Musculoskeletal ? ? Abdominal ?  ?Peds ? Hematology ?  ?Anesthesia Other Findings ? ? Reproductive/Obstetrics ? ?  ? ? ? ? ? ? ? ? ? ? ? ? ? ?  ?  ? ? ? ? ? ? ? ?Anesthesia Physical ?Anesthesia Plan ? ?ASA: 3 ? ?Anesthesia Plan: MAC  ? ?Post-op Pain Management: Minimal or no pain anticipated  ? ?Induction:  ? ?PONV Risk Score and Plan: 1 and Ondansetron and Treatment may vary due to age or medical condition ? ?Airway Management Planned: Natural Airway and Nasal Cannula ? ?Additional Equipment: None ? ?Intra-op Plan:  ? ?Post-operative Plan:  ? ?Informed Consent: I have reviewed the patients History and Physical, chart, labs and discussed the procedure including the risks, benefits and alternatives for the proposed anesthesia with the patient or authorized representative who has indicated his/her understanding and acceptance.  ? ? ? ?Dental advisory given ? ?Plan Discussed with: CRNA ? ?Anesthesia Plan Comments:   ? ? ? ? ? ?Anesthesia Quick Evaluation ? ?

## 2021-07-26 NOTE — Consult Note (Signed)
[facility] Consult Note ? ? ?History ?Sharon Hunt ?MRN # [id number] ? ?Date of Admission: 07/25/2021 ?Date of Consultation: 07/26/2021 ?Referring physician: Dr. Lady Deutscher, MD ?Primary Care Provider: Dorena Dew, FNP ?Primary Gastroenterologist: None -unassigned ? ? ?Reason for Consultation/Chief Complaint: Hematemesis ? ?Subjective  ?HPI: ? ?This is a 51 year old woman who presented to the ED today for worsening nausea and vomiting and development of small-volume hematemesis.  She describes herself as a "daily drinker", and says that amount of daily ingestion has recently increased.  She has been having dyspepsia, belching, heartburn symptoms and nausea and vomiting for at least several days.  After vomiting a few times she saw bright red blood and then coffee-ground material, she became concerned and came to the ED.  There is been no further hematemesis since arrival and no passage of melena, and she denies passing black tarry stool or bright red blood per rectum at home. ?She is also describing generalized abdominal pain though more toward the left side in the last day or 2. ?She is markedly hypokalemic and initial hemoglobin is normal.  She is being admitted for medical management and we are consulted regarding the bleeding. ?It does not sound like she gets regular medical care. ? ?ROS: ? ?Constitutional: She believes she has lost some weight but uncertain how much or over what period of time ? ? ?All other systems are negative except as noted above in the HPI ? ?Past Medical History ?Past Medical History:  ?Diagnosis Date  ? H/O hiatal hernia   ? Hypertension   ? ? ?Past Surgical History ?Past Surgical History:  ?Procedure Laterality Date  ? KNEE SURGERY    ? TONSILLECTOMY    ? TUBAL LIGATION    ? ? ?Family History ?No family history on file. ?No known family history of liver disease ?Social History ?Social History  ? ?Socioeconomic History  ? Marital status: Single  ?  Spouse  name: Not on file  ? Number of children: Not on file  ? Years of education: Not on file  ? Highest education level: Not on file  ?Occupational History  ? Not on file  ?Tobacco Use  ? Smoking status: Every Day  ?  Packs/day: 1.00  ?  Types: Cigarettes  ? Smokeless tobacco: Not on file  ?Substance and Sexual Activity  ? Alcohol use: No  ?  Alcohol/week: 4.0 standard drinks  ?  Types: 4 Cans of beer per week  ?  Comment: y  ? Drug use: No  ? Sexual activity: Never  ?Other Topics Concern  ? Not on file  ?Social History Narrative  ? Not on file  ? ?Social Determinants of Health  ? ?Financial Resource Strain: Not on file  ?Food Insecurity: Not on file  ?Transportation Needs: Not on file  ?Physical Activity: Not on file  ?Stress: Not on file  ?Social Connections: Not on file  ? ?She drinks alcohol daily, stating "heavy" ? ?Allergies ?Allergies  ?Allergen Reactions  ? Coconut Flavor Itching  ? ? ?Outpatient Meds ?Home medications from the [facility] and/or nursing med reconciliation reviewed. ? ?Inpatient med list reviewed ? ?_____________________________________________________________________ ?Objective  ? ?Exam: ? ?Current vital signs ? ?Patient Vitals for the past 8 hrs: ? BP Pulse Resp SpO2 Height  ?07/26/21 0908 (!) 155/88 (!) 103 -- -- --  ?07/26/21 0806 (!) 189/96 (!) 103 -- -- --  ?07/26/21 0800 -- -- -- -- '5\' 5"'$  (1.651 m)  ?07/26/21 0700 (!) 189/96 88 Marland Kitchen)  24 96 % --  ?07/26/21 0630 (!) 181/100 91 (!) 25 99 % --  ?07/26/21 0615 (!) 173/105 95 (!) 27 96 % --  ?07/26/21 0600 (!) 174/89 92 (!) 24 98 % --  ?07/26/21 0545 (!) 171/89 93 16 96 % --  ?07/26/21 0530 (!) 175/93 91 (!) 24 96 % --  ?07/26/21 0500 (!) 185/95 90 18 99 % --  ?07/26/21 0445 (!) 204/112 82 15 96 % --  ?07/26/21 0430 (!) 187/96 82 13 96 % --  ?07/26/21 0300 (!) 215/118 98 (!) 22 98 % --  ?07/26/21 0216 (!) 202/102 91 18 99 % --  ? ? ?Intake/Output Summary (Last 24 hours) at 07/26/2021 1009 ?Last data filed at 07/26/2021 6203 ?Gross per 24 hour  ?Intake 150  ml  ?Output --  ?Net 150 ml  ? ? ?Physical Exam: ? ? ?General: this is a female patient who is alert and conversational and pleasant.  She is laying on her right side because that makes her less nauseated. ?Eyes: Mild scleral icterus, no redness ?ENT: oral mucosa moist without lesions, no cervical or supraclavicular lymphadenopathy, ?CV: RRR without murmur, S1/S2, no JVD,, no peripheral edema ?Resp: clear to auscultation bilaterally, normal RR and effort noted ?GI: soft, overweight, epigastric and left upper quadrant tenderness, with active bowel sounds.  Left lobe liver enlarged at least 3 fingerbreadths below costal margin ?Spleen tip could not be palpated due to body habitus and tenderness ?Skin; warm and dry, no rash or jaundice noted ?Neuro: awake, alert and oriented x 3. Normal gross motor function and fluent speech. ? ?Labs: ? ? ?  Latest Ref Rng & Units 07/26/2021  ?  4:15 AM 01/07/2019  ?  3:54 AM 01/06/2019  ?  6:19 PM  ?CBC  ?WBC 4.0 - 10.5 K/uL 15.3   12.2     ?Hemoglobin 12.0 - 15.0 g/dL 13.9   10.4     ?Hematocrit 36.0 - 46.0 % 37.6   31.4   32.1    ?Platelets 150 - 400 K/uL 214   289     ? ? ? ?  Latest Ref Rng & Units 07/26/2021  ?  8:07 AM 07/26/2021  ?  1:19 AM 01/07/2019  ?  3:54 AM  ?CMP  ?Glucose 70 - 99 mg/dL 97   137   105    ?BUN 6 - 20 mg/dL <5   <5   12    ?Creatinine 0.44 - 1.00 mg/dL 0.88   0.79   1.08    ?Sodium 135 - 145 mmol/L 137   139   139    ?Potassium 3.5 - 5.1 mmol/L 3.2   2.7   3.2    ?Chloride 98 - 111 mmol/L 97   95   100    ?CO2 22 - 32 mmol/L '25   24   30    '$ ?Calcium 8.9 - 10.3 mg/dL 8.5   9.3   8.5    ?Total Protein 6.5 - 8.1 g/dL  7.1   5.6    ?Total Bilirubin 0.3 - 1.2 mg/dL  2.5   1.1    ?Alkaline Phos 38 - 126 U/L  132   78    ?AST 15 - 41 U/L  126   90    ?ALT 0 - 44 U/L  53   34    ? ?Lipase 26 ? ?Recent Labs  ?Lab 07/26/21 ?0611  ?INR 1.1  ? ?_________________________________________________________ ?Radiologic studies: ? ?CLINICAL DATA:  Abdominal pain, hematemesis. ?   ?EXAM: ?CT ABDOMEN AND PELVIS WITH CONTRAST ?  ?TECHNIQUE: ?Multidetector CT imaging of the abdomen and pelvis was performed ?using the standard protocol following bolus administration of ?intravenous contrast. ?  ?RADIATION DOSE REDUCTION: This exam was performed according to the ?departmental dose-optimization program which includes automated ?exposure control, adjustment of the mA and/or kV according to ?patient size and/or use of iterative reconstruction technique. ?  ?CONTRAST:  97m OMNIPAQUE IOHEXOL 300 MG/ML  SOLN ?  ?COMPARISON:  03/09/2015. ?  ?FINDINGS: ?Lower chest: No acute abnormality. ?  ?Hepatobiliary: There is marked hepatic steatosis. No focal liver ?abnormality. The gallbladder is without stones. No biliary ductal ?dilatation. ?  ?Pancreas: Unremarkable. No pancreatic ductal dilatation or ?surrounding inflammatory changes. ?  ?Spleen: Normal in size without focal abnormality. ?  ?Adrenals/Urinary Tract: No adrenal nodule or mass. 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 No definite renal calculus. ?Examination is limited due the presence of excreted contrast at the ?renal pyramids. No hydronephrosis. Diffuse bladder wall thickening ?is noted. ?  ?Stomach/Bowel: Stomach is within normal limits. Appendix appears ?normal. No evidence of bowel wall thickening, distention, or ?inflammatory changes. ?  ?Vascular/Lymphatic: No significant vascular findings are present. No ?enlarged abdominal or pelvic lymph nodes. ?  ?Reproductive: Uterus and bilateral adnexa are unremarkable. ?  ?Other: A small amount of free fluid is present in the perirectal ?space on the right. Small fat containing periumbilical hernia. Fat ?containing inguinal hernias are present bilaterally. ?  ?Musculoskeletal: There is sclerosis at the sacroiliac joints ?bilaterally, compatible with sacroiliitis. Degenerative changes are ?noted in the thoracolumbar spine. No acute osseous abnormality. ?  ?IMPRESSION: ?1. No bowel obstruction  or free air. ?2. Mild bladder wall thickening, possible infectious or inflammatory ?cystitis. ?3. Marked hepatic steatosis. ?  ?  ?Electronically Signed ?  By: LBrett FairyM.D. ?  On: 07/26/2021 03:15 ? ?

## 2021-07-26 NOTE — ED Provider Notes (Signed)
?[facility] ?Provider Note ? ? ?CSN: [id number] ?Arrival date & time: 07/25/21  2309 ? ?  ? ?History ? ?Chief Complaint  ?Patient presents with  ? Hematemesis  ? ? ?Sharon Hunt is a 51 y.o. female. ? ?51 year old female who presents to the ER today for hematemesis.  Patient states that she drinks daily for the last 5 to 6 years.  She did have 1 stent when she was sober for about 4 to 5 months but that was a couple years ago.  Patient states that she often does not eat or drink normally.  She has a history of gastritis and reflux related to a hiatal hernia.  She last had an EGD done in 2012.  Stated no varices at that time however had not been a regular drinker at that time either.  States compliance with medications.  Not any blood thinners. ? ? ? ?  ? ?Home Medications ?Prior to Admission medications   ?Medication Sig Start Date End Date Taking? Authorizing Provider  ?aluminum-magnesium hydroxide-simethicone (MAALOX) 301-601-09 MG/5ML SUSP Take 30 mLs by mouth in the morning and at bedtime.   Yes [provider]  ?diphenhydrAMINE (BENADRYL) 25 MG tablet Take 50 mg by mouth every 6 (six) hours as needed for allergies.   Yes [provider]  ?omeprazole (PRILOSEC OTC) 20 MG tablet Take 20 mg by mouth 2 (two) times daily as needed.   Yes [provider]  ?amLODipine (NORVASC) 10 MG tablet Take 0.5 tablets (5 mg total) by mouth daily. ?Patient not taking: Reported on 07/26/2021 04/04/15   Dorena Dew, FNP  ?B Complex-C (B-COMPLEX WITH VITAMIN C) tablet Take 1 tablet by mouth daily. ?Patient not taking: Reported on 07/26/2021 01/07/19   Radene Gunning, NP  ?clotrimazole (GYNE-LOTRIMIN) 1 % vaginal cream Place 1 Applicatorful vaginally at bedtime. ?Patient not taking: Reported on 07/26/2021 01/07/19   Geradine Girt, DO  ?folic acid (FOLVITE) 1 MG tablet Take 1 tablet (1 mg total) by mouth daily. ?Patient not taking: Reported on 07/26/2021 01/08/19    Radene Gunning, NP  ?gabapentin (NEURONTIN) 100 MG capsule Take 1 capsule (100 mg total) by mouth 3 (three) times daily. ?Patient not taking: Reported on 07/26/2021 01/07/19   Radene Gunning, NP  ?magnesium oxide (MAG-OX) 400 (241.3 Mg) MG tablet Take 1 tablet (400 mg total) by mouth daily. ?Patient not taking: Reported on 07/26/2021 01/07/19   Radene Gunning, NP  ?Multiple Vitamin (MULTIVITAMIN WITH MINERALS) TABS tablet Take 1 tablet by mouth daily. ?Patient not taking: Reported on 07/26/2021 01/08/19   Radene Gunning, NP  ?omeprazole (PRILOSEC) 40 MG capsule Take 40 mg by mouth daily. ?Patient not taking: Reported on 07/26/2021 12/24/18   [provider]  ?thiamine 100 MG tablet Take 1 tablet (100 mg total) by mouth daily. ?Patient not taking: Reported on 07/26/2021 01/08/19   Radene Gunning, NP  ?   ? ?Allergies    ?Coconut flavor   ? ?Review of Systems   ?Review of Systems ? ?Physical Exam ?Updated Vital Signs ?BP (!) 189/96   Pulse 88   Temp 98.3 ?F (36.8 ?C) (Oral)   Resp (!) 24   Wt 79.4 kg   LMP 03/20/2021   SpO2 96%   BMI 29.12 kg/m?  ?Physical Exam ?Vitals and nursing note reviewed.  ?Constitutional:   ?   Appearance: She is well-developed.  ?HENT:  ?   Head: Normocephalic and atraumatic.  ?  Nose: Nose normal. No congestion or rhinorrhea.  ?   Mouth/Throat:  ?   Mouth: Mucous membranes are moist.  ?   Pharynx: Oropharynx is clear.  ?Eyes:  ?   Pupils: Pupils are equal, round, and reactive to light.  ?Cardiovascular:  ?   Rate and Rhythm: Normal rate and regular rhythm.  ?Pulmonary:  ?   Effort: No respiratory distress.  ?   Breath sounds: No stridor.  ?Abdominal:  ?   General: Abdomen is flat. There is no distension.  ?Musculoskeletal:     ?   General: No swelling or tenderness. Normal range of motion.  ?   Cervical back: Normal range of motion.  ?Skin: ?   General: Skin is warm and dry.  ?Neurological:  ?   General: No focal deficit present.  ?   Mental Status: She is alert.  ? ? ?ED Results  / Procedures / Treatments   ?Labs ?(all labs ordered are listed, but only abnormal results are displayed) ?Labs Reviewed  ?COMPREHENSIVE METABOLIC PANEL - Abnormal; Notable for the following components:  ?    Result Value  ? Potassium 2.7 (*)   ? Chloride 95 (*)   ? Glucose, Bld 137 (*)   ? BUN <5 (*)   ? AST 126 (*)   ? ALT 53 (*)   ? Alkaline Phosphatase 132 (*)   ? Total Bilirubin 2.5 (*)   ? Anion gap 20 (*)   ? All other components within normal limits  ?CBC WITH DIFFERENTIAL/PLATELET - Abnormal; Notable for the following components:  ? WBC 15.3 (*)   ? MCH 34.9 (*)   ? MCHC 37.0 (*)   ? Neutro Abs 11.7 (*)   ? Monocytes Absolute 1.1 (*)   ? Abs Immature Granulocytes 0.18 (*)   ? All other components within normal limits  ?MAGNESIUM - Abnormal; Notable for the following components:  ? Magnesium 1.4 (*)   ? All other components within normal limits  ?LIPASE, BLOOD  ?HCG, QUANTITATIVE, PREGNANCY  ?PROTIME-INR  ?CBC WITH DIFFERENTIAL/PLATELET  ?TYPE AND SCREEN  ?ABO/RH  ? ? ?EKG ?EKG Interpretation ? ?Date/Time:  Thursday Jul 25 2021 23:50:20 EDT ?Ventricular Rate:  104 ?PR Interval:  132 ?QRS Duration: 78 ?QT Interval:  402 ?QTC Calculation: 528 ?R Axis:   40 ?Text Interpretation: Sinus tachycardia Right atrial enlargement Cannot rule out Anterior infarct , age undetermined Prolonged QT Abnormal ECG When compared with ECG of 06-Jan-2019 07:14, PREVIOUS ECG IS PRESENT Confirmed by Merrily Pew [phone]) on 07/26/2021 2:45:44 AM ? ?Radiology ?DG Chest 2 View ? ?Result Date: 07/26/2021 ?CLINICAL DATA:  Cough EXAM: CHEST - 2 VIEW COMPARISON:  01/05/2019 FINDINGS: The heart size and mediastinal contours are within normal limits. Both lungs are clear. The visualized skeletal structures are unremarkable. IMPRESSION: Normal study. Electronically Signed   By: Rolm Baptise M.D.   On: 07/26/2021 01:12  ? ?CT ABDOMEN PELVIS W CONTRAST ? ?Result Date: 07/26/2021 ?CLINICAL DATA:  Abdominal pain, hematemesis. EXAM: CT ABDOMEN AND  PELVIS WITH CONTRAST TECHNIQUE: Multidetector CT imaging of the abdomen and pelvis was performed using the standard protocol following bolus administration of intravenous contrast. RADIATION DOSE REDUCTION: This exam was performed according to the departmental dose-optimization program which includes automated exposure control, adjustment of the mA and/or kV according to patient size and/or use of iterative reconstruction technique. CONTRAST:  82m OMNIPAQUE IOHEXOL 300 MG/ML  SOLN COMPARISON:  03/09/2015. FINDINGS: Lower chest: No acute abnormality. Hepatobiliary: There is marked hepatic  steatosis. No focal liver abnormality. The gallbladder is without stones. No biliary ductal dilatation. Pancreas: Unremarkable. No pancreatic ductal dilatation or surrounding inflammatory changes. Spleen: Normal in size without focal abnormality. Adrenals/Urinary Tract: No adrenal nodule or mass. 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 No definite renal calculus. Examination is limited due the presence of excreted contrast at the renal pyramids. No hydronephrosis. Diffuse bladder wall thickening is noted. Stomach/Bowel: Stomach is within normal limits. Appendix appears normal. No evidence of bowel wall thickening, distention, or inflammatory changes. Vascular/Lymphatic: No significant vascular findings are present. No enlarged abdominal or pelvic lymph nodes. Reproductive: Uterus and bilateral adnexa are unremarkable. Other: A small amount of free fluid is present in the perirectal space on the right. Small fat containing periumbilical hernia. Fat containing inguinal hernias are present bilaterally. Musculoskeletal: There is sclerosis at the sacroiliac joints bilaterally, compatible with sacroiliitis. Degenerative changes are noted in the thoracolumbar spine. No acute osseous abnormality. IMPRESSION: 1. No bowel obstruction or free air. 2. Mild bladder wall thickening, possible infectious or inflammatory cystitis. 3. Marked  hepatic steatosis. Electronically Signed   By: Brett Fairy M.D.   On: 07/26/2021 03:15   ? ?Procedures ?Procedures  ? ? ?Medications Ordered in ED ?Medications  ?HYDROmorphone (DILAUDID) injection 1 mg (has no

## 2021-07-27 ENCOUNTER — Inpatient Hospital Stay (HOSPITAL_COMMUNITY): Payer: Self-pay | Admitting: Anesthesiology

## 2021-07-27 ENCOUNTER — Encounter (HOSPITAL_COMMUNITY): Payer: Self-pay | Admitting: Internal Medicine

## 2021-07-27 ENCOUNTER — Encounter (HOSPITAL_COMMUNITY)
Admission: EM | Disposition: A | Discharge status: Home / Self Care | Payer: Self-pay | Source: Home / Self Care | Attending: Orthopedic Surgery

## 2021-07-27 DIAGNOSIS — K3189 Other diseases of stomach and duodenum: Secondary | ICD-10-CM

## 2021-07-27 DIAGNOSIS — K222 Esophageal obstruction: Secondary | ICD-10-CM

## 2021-07-27 DIAGNOSIS — I10 Essential (primary) hypertension: Secondary | ICD-10-CM

## 2021-07-27 DIAGNOSIS — E876 Hypokalemia: Secondary | ICD-10-CM

## 2021-07-27 DIAGNOSIS — K2100 Gastro-esophageal reflux disease with esophagitis, without bleeding: Secondary | ICD-10-CM

## 2021-07-27 DIAGNOSIS — F1010 Alcohol abuse, uncomplicated: Secondary | ICD-10-CM

## 2021-07-27 DIAGNOSIS — K219 Gastro-esophageal reflux disease without esophagitis: Secondary | ICD-10-CM

## 2021-07-27 DIAGNOSIS — K920 Hematemesis: Secondary | ICD-10-CM

## 2021-07-27 HISTORY — PX: ESOPHAGOGASTRODUODENOSCOPY (EGD) WITH PROPOFOL: SHX5813

## 2021-07-27 HISTORY — PX: BIOPSY: SHX5522

## 2021-07-27 LAB — CBC
HCT: 36.7 % (ref 36.0–46.0)
Hemoglobin: 13.3 g/dL (ref 12.0–15.0)
MCH: 34.9 pg — ABNORMAL HIGH (ref 26.0–34.0)
MCHC: 36.2 g/dL — ABNORMAL HIGH (ref 30.0–36.0)
MCV: 96.3 fL (ref 80.0–100.0)
Platelets: 199 10*3/uL (ref 150–400)
RBC: 3.81 MIL/uL — ABNORMAL LOW (ref 3.87–5.11)
RDW: 15.5 % (ref 11.5–15.5)
WBC: 12.7 10*3/uL — ABNORMAL HIGH (ref 4.0–10.5)
nRBC: 0.2 % (ref 0.0–0.2)

## 2021-07-27 LAB — BASIC METABOLIC PANEL
Anion gap: 9 (ref 5–15)
BUN: <5 mg/dL — ABNORMAL LOW (ref 6–20)
CO2: 26 mmol/L (ref 22–32)
Calcium: 7.9 mg/dL — ABNORMAL LOW (ref 8.9–10.3)
Chloride: 103 mmol/L (ref 98–111)
Creatinine, Ser: 0.7 mg/dL (ref 0.44–1.00)
GFR, Estimated: >60 mL/min (ref >60)
Glucose, Bld: 70 mg/dL (ref 70–99)
Potassium: 3.7 mmol/L (ref 3.5–5.1)
Sodium: 138 mmol/L (ref 135–145)

## 2021-07-27 LAB — MAGNESIUM: Magnesium: 1.9 mg/dL (ref 1.7–2.4)

## 2021-07-27 SURGERY — ESOPHAGOGASTRODUODENOSCOPY (EGD) WITH PROPOFOL
Anesthesia: Monitor Anesthesia Care

## 2021-07-27 MED ORDER — ADULT MULTIVITAMIN W/MINERALS CH
1.0000 | ORAL_TABLET | Freq: Every day | ORAL | Status: DC
  Administered 2021-07-27 – 2021-07-30 (×4): 1 via ORAL
  Filled 2021-07-27 (×4): qty 1

## 2021-07-27 MED ORDER — CHLORDIAZEPOXIDE HCL 25 MG PO CAPS
25.0000 mg | ORAL_CAPSULE | Freq: Four times a day (QID) | ORAL | Status: AC
  Administered 2021-07-27 (×4): 25 mg via ORAL
  Filled 2021-07-27 (×4): qty 1

## 2021-07-27 MED ORDER — PANTOPRAZOLE SODIUM 40 MG PO TBEC
40.0000 mg | DELAYED_RELEASE_TABLET | Freq: Every day | ORAL | Status: DC
  Administered 2021-07-27 – 2021-07-30 (×4): 40 mg via ORAL
  Filled 2021-07-27 (×4): qty 1

## 2021-07-27 MED ORDER — HYDROXYZINE HCL 25 MG PO TABS
25.0000 mg | ORAL_TABLET | Freq: Four times a day (QID) | ORAL | Status: AC | PRN
  Administered 2021-07-27: 25 mg via ORAL
  Filled 2021-07-27: qty 1

## 2021-07-27 MED ORDER — PROPOFOL 10 MG/ML IV BOLUS
INTRAVENOUS | Status: DC | PRN
Start: 2021-07-27 — End: 2021-07-27
  Administered 2021-07-27: 20 mg via INTRAVENOUS
  Administered 2021-07-27: 10 mg via INTRAVENOUS
  Administered 2021-07-27: 20 mg via INTRAVENOUS

## 2021-07-27 MED ORDER — AMLODIPINE BESYLATE 5 MG PO TABS
5.0000 mg | ORAL_TABLET | Freq: Every day | ORAL | Status: DC
Start: 2021-07-27 — End: 2021-07-28
  Administered 2021-07-27: 5 mg via ORAL
  Filled 2021-07-27: qty 1

## 2021-07-27 MED ORDER — LOPERAMIDE HCL 2 MG PO CAPS: 2.0000 mg | ORAL_CAPSULE | ORAL | Status: AC | PRN

## 2021-07-27 MED ORDER — MAGNESIUM OXIDE 400 (241.3 MG) MG PO TABS: 400.0000 mg | ORAL_TABLET | Freq: Every day | ORAL | Status: DC

## 2021-07-27 MED ORDER — LIDOCAINE 2% (20 MG/ML) 5 ML SYRINGE
INTRAMUSCULAR | Status: DC | PRN
Start: 2021-07-27 — End: 2021-07-27
  Administered 2021-07-27: 60 mg via INTRAVENOUS

## 2021-07-27 MED ORDER — CHLORDIAZEPOXIDE HCL 25 MG PO CAPS
25.0000 mg | ORAL_CAPSULE | Freq: Three times a day (TID) | ORAL | Status: AC
  Administered 2021-07-28 (×3): 25 mg via ORAL
  Filled 2021-07-27 (×3): qty 1

## 2021-07-27 MED ORDER — PROPOFOL 500 MG/50ML IV EMUL
INTRAVENOUS | Status: DC | PRN
  Administered 2021-07-27: 150 ug/kg/min via INTRAVENOUS

## 2021-07-27 MED ORDER — CHLORDIAZEPOXIDE HCL 25 MG PO CAPS
25.0000 mg | ORAL_CAPSULE | ORAL | Status: AC
  Administered 2021-07-29 (×2): 25 mg via ORAL
  Filled 2021-07-27 (×2): qty 1

## 2021-07-27 MED ORDER — ONDANSETRON 4 MG PO TBDP: 4.0000 mg | ORAL_TABLET | Freq: Four times a day (QID) | ORAL | Status: DC | PRN

## 2021-07-27 MED ORDER — THIAMINE HCL 100 MG/ML IJ SOLN
100.0000 mg | Freq: Once | INTRAMUSCULAR | Status: AC
  Administered 2021-07-27: 100 mg via INTRAMUSCULAR
  Filled 2021-07-27: qty 2

## 2021-07-27 MED ORDER — CHLORDIAZEPOXIDE HCL 25 MG PO CAPS
25.0000 mg | ORAL_CAPSULE | Freq: Four times a day (QID) | ORAL | Status: AC | PRN
  Administered 2021-07-27: 25 mg via ORAL
  Filled 2021-07-27: qty 1

## 2021-07-27 MED ORDER — CHLORDIAZEPOXIDE HCL 25 MG PO CAPS
25.0000 mg | ORAL_CAPSULE | Freq: Every day | ORAL | Status: AC
  Administered 2021-07-30: 25 mg via ORAL
  Filled 2021-07-27: qty 1

## 2021-07-27 MED ORDER — THIAMINE HCL 100 MG PO TABS: 100.0000 mg | ORAL_TABLET | Freq: Every day | ORAL | Status: DC

## 2021-07-27 MED ORDER — MAGNESIUM OXIDE -MG SUPPLEMENT 400 (240 MG) MG PO TABS
400.0000 mg | ORAL_TABLET | Freq: Every day | ORAL | Status: DC
  Administered 2021-07-27 – 2021-07-30 (×4): 400 mg via ORAL
  Filled 2021-07-27 (×4): qty 1

## 2021-07-27 SURGICAL SUPPLY — 15 items

## 2021-07-27 NOTE — Progress Notes (Signed)
?PROGRESS NOTE ? ? ? ?Sharon Hunt  LYY:[id number] DOB: 10-Feb-1971 DOA: 07/25/2021 ?PCP: Dorena Dew, FNP  ? ? ?Chief Complaint  ?Patient presents with  ? Hematemesis  ? ? ?Brief Narrative:  ?Patient 51 year old female history of significant alcohol abuse, GERD, hypertension, tobacco abuse, hyperlipidemia, history of abnormal uterine bleeding with leiomyoma of the uterus, history of QT prolongation presented to the ED with complaints of coffee-ground emesis, hematemesis x3 days.  Patient noted to have had large volumes of blood 2 to 3 days ago prior to admission which is tapered down.  Patient with significant alcohol use on a daily basis.  Patient seen in the ED admitted for evaluation of GI bleed.  Patient seen by gastroenterology and patient underwent upper endoscopy on 07/27/2021. ? ? ?Assessment & Plan: ?  ?Principal Problem: ?  GI bleeding ?Active Problems: ?  Alcohol abuse ?  Hypokalemia ?  Hypomagnesemia ?  GERD (gastroesophageal reflux disease) ?  HTN (hypertension) ? ?#1 vomiting induced upper GI bleed/esophagitis ? -Patient presented with concerns for hematemesis/coffee-ground emesis. ?-Patient with history of significant/heavy alcohol use. ?-Patient with no prior history of varices. ?-Patient admitted, placed on IV PPI twice daily and made NPO. ?-GI consulted and patient underwent upper endoscopy 07/27/2021 which showed LA grade a reflux esophagitis with no active bleeding noted, widely patent Schatzki's ring, congested and erythematous mucosa of the gastric fundus and body.  Biopsies taken. ?-Patient with no further hematemesis or coffee-ground emesis. ?-Advance diet as tolerated. ?-Supportive care. ? ?2.  Hypomagnesemia/hypokalemia ?-Likely secondary to GI losses. ?-Replete. ? ?3.  GERD ?-Continue PPI. ? ?4.  Alcohol abuse ?-Alcohol cessation stressed to patient. ?-Consult with TOC. ?-Placed on the Librium detox protocol. ? ?5.  Hypertension ?-Antihypertensive medications initially held. ?-Resume  home regimen Norvasc. ? ?6.  History of alcohol abuse ?-Patient with significant heavy alcohol use and noted per admitting physician to drink daily for the past 5 to 6 years. ?-Patient was on the Ativan withdrawal protocol. ?-Place on the [facility] detox protocol. ?-Continue folic acid, thiamine, multivitamin, ? ? ?DVT prophylaxis: SCDs ?Code Status: Full ?Family Communication: Updated patient.  No family at bedside ?Disposition:  ? ?Status is: Inpatient ?Remains inpatient appropriate because: Severity of illness ?  ?Consultants:  ?Gastroenterology: Dr.Danis III 07/27/2018 ? ?Procedures:  ?CT abdomen pelvis 07/26/2021 ?Chest x-ray 07/26/2021 ?EGD/upper endoscopy.  Dr.Danis III 07/27/2021 ? ? ?Antimicrobials: ?None ? ? ?Subjective: ?Sitting up in bed.  Some complaints of nausea but no emesis.  Tolerating clear liquids.  Complain of some diffuse abdominal pain.  No further hematemesis.  No chest pain.  No shortness of breath. ? ?Objective: ?Vitals:  ? 07/27/21 0858 07/27/21 0908 07/27/21 1004 07/27/21 1637  ?BP: (!) 163/93 (!) 167/93 (!) 176/101 (!) 154/89  ?Pulse: 94 88 84 87  ?Resp: 17 (!) '30 16 16  '$ ?Temp:   98.6 ?F (37 ?C) 98.7 ?F (37.1 ?C)  ?TempSrc:   Oral Oral  ?SpO2: 96% 94% 96% 95%  ?Weight:      ?Height:      ? ? ?Intake/Output Summary (Last 24 hours) at 07/27/2021 1701 ?Last data filed at 07/27/2021 0400 ?Gross per 24 hour  ?Intake 1719.92 ml  ?Output --  ?Net 1719.92 ml  ? ?Filed Weights  ? 07/25/21 2351  ?Weight: 79.4 kg  ? ? ?Examination: ? ?General exam: Appears calm and comfortable  ?Respiratory system: Clear to auscultation. Respiratory effort normal. ?Cardiovascular system: S1 & S2 heard, RRR. No JVD, murmurs, rubs, gallops or clicks. No  pedal edema. ?Gastrointestinal system: Abdomen is nondistended, soft and tender to palpation in the epigastrium, left upper and left lower quadrant.  Positive bowel sounds.  No rebound.  No guarding.  ?Central nervous system: Alert and oriented. No focal neurological  deficits. ?Extremities: Symmetric 5 x 5 power. ?Skin: No rashes, lesions or ulcers ?Psychiatry: Judgement and insight appear normal. Mood & affect appropriate.  ? ? ? ?Data Reviewed: I have personally reviewed following labs and imaging studies ? ?CBC: ?Recent Labs  ?Lab 07/26/21 ?0415 07/26/21 ?1526 07/27/21 ?0050  ?WBC 15.3* 13.4* 12.7*  ?NEUTROABS 11.7*  --   --   ?HGB 13.9 15.9* 13.3  ?HCT 37.6 42.4 36.7  ?MCV 94.5 94.2 96.3  ?PLT 214 197 199  ? ? ?Basic Metabolic Panel: ?Recent Labs  ?Lab 07/26/21 ?0119 07/26/21 ?0807 07/26/21 ?1526 07/27/21 ?0050  ?NA 139 137 139 138  ?K 2.7* 3.2* 3.2* 3.7  ?CL 95* 97* 100 103  ?CO2 '24 25 28 26  '$ ?GLUCOSE 137* 97 95 70  ?BUN <5* <5* <5* <5*  ?CREATININE 0.79 0.88 0.78 0.70  ?CALCIUM 9.3 8.5* 8.1* 7.9*  ?MG 1.4* 1.8 1.8 1.9  ? ? ?GFR: ?Estimated Creatinine Clearance: 86.7 mL/min (by C-G formula based on SCr of 0.7 mg/dL). ? ?Liver Function Tests: ?Recent Labs  ?Lab 07/26/21 ?0119  ?AST 126*  ?ALT 53*  ?ALKPHOS 132*  ?BILITOT 2.5*  ?PROT 7.1  ?ALBUMIN 3.5  ? ? ?CBG: ?No results for input(s): GLUCAP in the last 168 hours. ? ? ?Recent Results (from the past 240 hour(s))  ?Resp Panel by RT-PCR (Flu A&B, Covid) Nasopharyngeal Swab     Status: None  ? Collection Time: 07/26/21  8:49 AM  ? Specimen: Nasopharyngeal Swab; Nasopharyngeal(NP) swabs in vial transport medium  ?Result Value Ref Range Status  ? SARS Coronavirus 2 by RT PCR NEGATIVE NEGATIVE Final  ?  Comment: (NOTE) ?SARS-CoV-2 target nucleic acids are NOT DETECTED. ? ?The SARS-CoV-2 RNA is generally detectable in upper respiratory ?specimens during the acute phase of infection. The lowest ?concentration of SARS-CoV-2 viral copies this assay can detect is ?138 copies/mL. A negative result does not preclude SARS-Cov-2 ?infection and should not be used as the sole basis for treatment or ?other patient management decisions. A negative result may occur with  ?improper specimen collection/handling, submission of specimen  other ?than nasopharyngeal swab, presence of viral mutation(s) within the ?areas targeted by this assay, and inadequate number of viral ?copies(<138 copies/mL). A negative result must be combined with ?clinical observations, patient history, and epidemiological ?information. The expected result is Negative. ? ?Fact Sheet for Patients:  ?EntrepreneurPulse.com.au ? ?Fact Sheet for Healthcare Providers:  ?IncredibleEmployment.be ? ?This test is no t yet approved or cleared by the [place] FDA and  ?has been authorized for detection and/or diagnosis of SARS-CoV-2 by ?FDA under an Emergency Use Authorization (EUA). This EUA will remain  ?in effect (meaning this test can be used) for the duration of the ?COVID-19 declaration under Section 564(b)(1) of the Act, 21 ?U.S.C.section 360bbb-3(b)(1), unless the authorization is terminated  ?or revoked sooner.  ? ? ?  ? Influenza A by PCR NEGATIVE NEGATIVE Final  ? Influenza B by PCR NEGATIVE NEGATIVE Final  ?  Comment: (NOTE) ?The Xpert Xpress SARS-CoV-2/FLU/RSV plus assay is intended as an aid ?in the diagnosis of influenza from Nasopharyngeal swab specimens and ?should not be used as a sole basis for treatment. Nasal washings and ?aspirates are unacceptable for Xpert Xpress SARS-CoV-2/FLU/RSV ?testing. ? ?Fact Sheet  for Patients: ?EntrepreneurPulse.com.au ? ?Fact Sheet for Healthcare Providers: ?IncredibleEmployment.be ? ?This test is not yet approved or cleared by the [place] FDA and ?has been authorized for detection and/or diagnosis of SARS-CoV-2 by ?FDA under an Emergency Use Authorization (EUA). This EUA will remain ?in effect (meaning this test can be used) for the duration of the ?COVID-19 declaration under Section 564(b)(1) of the Act, 21 U.S.C. ?section 360bbb-3(b)(1), unless the authorization is terminated or ?revoked. ? ?Performed at [facility], [address], [place],  [place] ?[address] ?  ?  ? ? ? ? ? ?Radiology Studies: ?DG Chest 2 View ? ?Result Date: 07/26/2021 ?CLINICAL DATA:  Cough EXAM: CHEST - 2 VIEW COMPARISON:  01/05/2019 FINDINGS: The heart size and mediastinal contours are withi

## 2021-07-27 NOTE — Progress Notes (Signed)
Assumed care of pt at this time.

## 2021-07-27 NOTE — Interval H&P Note (Signed)
History and Physical Interval Note: ? ?07/27/2021 ?8:20 AM ? ?Sharon Hunt  has presented today for surgery, with the diagnosis of hematemesis.  The various methods of treatment have been discussed with the patient and family. After consideration of risks, benefits and other options for treatment, the patient has consented to  Procedure(s): ?ESOPHAGOGASTRODUODENOSCOPY (EGD) WITH PROPOFOL (N/A) as a surgical intervention.  The patient's history has been reviewed, patient examined, no change in status, stable for surgery.  I have reviewed the patient's chart and labs.  Questions were answered to the patient's satisfaction.   ? ?No further hematemesis reported, Hgb dropped with IVF but remains normal today. ?Patient interviewed/examined and is stable - proceed with EGD. ? ?Nelida Meuse III ? ? ?

## 2021-07-27 NOTE — Anesthesia Postprocedure Evaluation (Signed)
Anesthesia Post Note ? ?Patient: Sharon Hunt ? ?Procedure(s) Performed: ESOPHAGOGASTRODUODENOSCOPY (EGD) WITH PROPOFOL ?BIOPSY ? ?  ? ?Patient location during evaluation: Endoscopy ?Anesthesia Type: MAC ?Level of consciousness: oriented, awake and alert and awake ?Pain management: pain level controlled ?Vital Signs Assessment: post-procedure vital signs reviewed and stable ?Respiratory status: spontaneous breathing, nonlabored ventilation, respiratory function stable and patient connected to nasal cannula oxygen ?Cardiovascular status: blood pressure returned to baseline and stable ?Postop Assessment: no headache, no backache and no apparent nausea or vomiting ?Anesthetic complications: no ? ? ?No notable events documented. ? ?Last Vitals:  ?Vitals:  ? 07/27/21 0852 07/27/21 0858  ?BP: (!) 157/89 (!) 163/93  ?Pulse: 91 94  ?Resp: 17 17  ?Temp: 36.7 ?C   ?SpO2: 96% 96%  ?  ?Last Pain:  ?Vitals:  ? 07/27/21 0858  ?TempSrc:   ?PainSc: 0-No pain  ? ?Pain Goal:   ? ?  ?  ?  ?  ?  ?  ?  ? ?Santa Lighter ? ? ? ? ?

## 2021-07-27 NOTE — Transfer of Care (Signed)
Immediate Anesthesia Transfer of Care Note ? ?Patient: Sharon Hunt ? ?Procedure(s) Performed: ESOPHAGOGASTRODUODENOSCOPY (EGD) WITH PROPOFOL ?BIOPSY ? ?Patient Location: PACU ? ?Anesthesia Type:MAC ? ?Level of Consciousness: awake, alert , oriented and patient cooperative ? ?Airway & Oxygen Therapy: Patient Spontanous Breathing ? ?Post-op Assessment: Report given to RN and Post -op Vital signs reviewed and stable ? ?Post vital signs: Reviewed and stable ? ?Last Vitals:  ?Vitals Value Taken Time  ?BP 157/89 07/27/21 0849  ?Temp    ?Pulse 93 07/27/21 0850  ?Resp 16 07/27/21 0850  ?SpO2 93 % 07/27/21 0850  ?Vitals shown include unvalidated device data. ? ?Last Pain:  ?Vitals:  ? 07/27/21 0826  ?TempSrc:   ?PainSc: 0-No pain  ?   ? ?  ? ?Complications: No notable events documented. ?

## 2021-07-27 NOTE — Progress Notes (Addendum)
Per pt report, she has not voided since yesterday. It was confirmed by NT that pt has not voided today. No charted urine output at this time. ?Bladder scan done, showing 368m urine. Pt able to void immediately after scan. Urine is clear but amber in color. Urine sample sent to lab for ordered testing. ?

## 2021-07-27 NOTE — Op Note (Signed)
[facility] ?Patient Name: Sharon Hunt ?Procedure Date : 07/27/2021 ?MRN: [id number] ?Attending MD: Estill Cotta. Loletha Carrow , MD ?Date of Birth: 10-23-1970 ?CSN: [id number] ?Age: 51 ?Admit Type: Inpatient ?Procedure:                Upper GI endoscopy ?Indications:              Hematemesis, Nausea with vomiting ?                          coffee grounds emesis reported, Hgb remains normal ?Providers:                Mallie Mussel L. Loletha Carrow, MD, Doristine Johns, RN, Alphonzo Grieve  ?                          Leighton Roach, Technician ?Referring MD:             [facility] ?Medicines:                Monitored Anesthesia Care ?Complications:            No immediate complications. ?Estimated Blood Loss:     Estimated blood loss was minimal. ?Procedure:                Pre-Anesthesia Assessment: ?                          - Prior to the procedure, a History and Physical  ?                          was performed, and patient medications and  ?                          allergies were reviewed. The patient's tolerance of  ?                          previous anesthesia was also reviewed. The risks  ?                          and benefits of the procedure and the sedation  ?                          options and risks were discussed with the patient.  ?                          All questions were answered, and informed consent  ?                          was obtained. Prior Anticoagulants: The patient has  ?                          taken no previous anticoagulant or antiplatelet  ?                          agents. ASA Grade Assessment: III - A patient with  ?  severe systemic disease. After reviewing the risks  ?                          and benefits, the patient was deemed in  ?                          satisfactory condition to undergo the procedure. ?                          After obtaining informed consent, the endoscope was  ?                          passed under direct vision. Throughout the  ?                           procedure, the patient's blood pressure, pulse, and  ?                          oxygen saturations were monitored continuously. The  ?                          [id number]-[id number] ([id number]) Olympus endoscope was introduced  ?                          through the mouth, and advanced to the second part  ?                          of duodenum. The upper GI endoscopy was  ?                          accomplished without difficulty. The patient  ?                          tolerated the procedure well. ?Scope In: ?Scope Out: ?Findings: ?     LA Grade A (one or more mucosal breaks less than 5 mm, not extending  ?     between tops of 2 mucosal folds) esophagitis with no bleeding was found  ?     at the gastroesophageal junction. ?     A widely patent Schatzki ring was found at the gastroesophageal junction. ?     Patchy mild mucosal changes characterized by congestion and erythema  ?     were found in the gastric fundus and in the gastric body. Several  ?     biopsies were obtained on the greater curvature of the gastric body, on  ?     the lesser curvature of the gastric body, on the greater curvature of  ?     the gastric antrum and on the lesser curvature of the gastric antrum  ?     with cold forceps for histology. ?     The exam of the stomach was otherwise normal. ?     The examined duodenum was normal. ?Impression:               - LA Grade A reflux (and vomiting) esophagitis with  ?  no bleeding. ?                          - Widely patent Schatzki ring. ?                          - Congested and erythematous mucosa in the gastric  ?                          fundus and gastric body. ?                          - Normal examined duodenum. ?                          - Several biopsies were obtained on the greater  ?                          curvature of the gastric body, on the lesser  ?                          curvature of the gastric body, on the greater  ?                          curvature  of the gastric antrum and on the lesser  ?                          curvature of the gastric antrum. ?                          There may have been vomiting-induced UGI mucosal  ?                          bleeding. No source seen other than mild  ?                          esophagitis. ?Recommendation:           - Return patient to hospital ward for ongoing care. ?                          - Resume regular diet. ?                          - Pantoprazole 40 mg oncce daily ?                          Anti-reflux diet and lifestyle indefinitely ?                          Stop drinking alcohol. ?                          When patient is able to keep food and medicines  ?  down, she can be discharged home from a GI  ?                          perspsective for as-needed follow up in our office. ?                          - Await pathology results. Patient will be  ?                          contacted with results after discharge. GI  ?                          inpatient service signing off. ?Procedure Code(s):        --- Professional --- ?                          [phone], Esophagogastroduodenoscopy, flexible,  ?                          transoral; with biopsy, single or multiple ?Diagnosis Code(s):        --- Professional --- ?                          K21.00, Gastro-esophageal reflux disease with  ?                          esophagitis, without bleeding ?                          K22.2, Esophageal obstruction ?                          K31.89, Other diseases of stomach and duodenum ?                          K92.0, Hematemesis ?                          R11.2, Nausea with vomiting, unspecified ?CPT copyright 2019 American Medical Association. All rights reserved. ?The codes documented in this report are preliminary and upon coder review may  ?be revised to meet current compliance requirements. ?Rendi Mapel L. Loletha Carrow, MD ?07/27/2021 8:55:48 AM ?This report has been signed electronically. ?Number of Addenda:  0 ?

## 2021-07-28 DIAGNOSIS — K209 Esophagitis, unspecified without bleeding: Secondary | ICD-10-CM | POA: Diagnosis present

## 2021-07-28 LAB — URINALYSIS, ROUTINE W REFLEX MICROSCOPIC
Bacteria, UA: NONE SEEN
Bilirubin Urine: NEGATIVE
Glucose, UA: NEGATIVE mg/dL
Ketones, ur: NEGATIVE mg/dL
Nitrite: NEGATIVE
Protein, ur: NEGATIVE mg/dL
Specific Gravity, Urine: 1.009 (ref 1.005–1.030)
pH: 8 (ref 5.0–8.0)

## 2021-07-28 LAB — RENAL FUNCTION PANEL
Albumin: 2.7 g/dL — ABNORMAL LOW (ref 3.5–5.0)
Anion gap: 8 (ref 5–15)
BUN: <5 mg/dL — ABNORMAL LOW (ref 6–20)
CO2: 23 mmol/L (ref 22–32)
Calcium: 8.3 mg/dL — ABNORMAL LOW (ref 8.9–10.3)
Chloride: 108 mmol/L (ref 98–111)
Creatinine, Ser: 0.65 mg/dL (ref 0.44–1.00)
GFR, Estimated: >60 mL/min (ref >60)
Glucose, Bld: 88 mg/dL (ref 70–99)
Phosphorus: 2.4 mg/dL — ABNORMAL LOW (ref 2.5–4.6)
Potassium: 4.2 mmol/L (ref 3.5–5.1)
Sodium: 139 mmol/L (ref 135–145)

## 2021-07-28 LAB — CBC WITH DIFFERENTIAL/PLATELET
Abs Immature Granulocytes: 0.39 10*3/uL — ABNORMAL HIGH (ref 0.00–0.07)
Basophils Absolute: 0.1 10*3/uL (ref 0.0–0.1)
Basophils Relative: 1 %
Eosinophils Absolute: 0.2 10*3/uL (ref 0.0–0.5)
Eosinophils Relative: 1 %
HCT: 35.9 % — ABNORMAL LOW (ref 36.0–46.0)
Hemoglobin: 13.1 g/dL (ref 12.0–15.0)
Immature Granulocytes: 4 %
Lymphocytes Relative: 27 %
Lymphs Abs: 3 10*3/uL (ref 0.7–4.0)
MCH: 35.1 pg — ABNORMAL HIGH (ref 26.0–34.0)
MCHC: 36.5 g/dL — ABNORMAL HIGH (ref 30.0–36.0)
MCV: 96.2 fL (ref 80.0–100.0)
Monocytes Absolute: 0.6 10*3/uL (ref 0.1–1.0)
Monocytes Relative: 5 %
Neutro Abs: 7.1 10*3/uL (ref 1.7–7.7)
Neutrophils Relative %: 62 %
Platelets: 200 10*3/uL (ref 150–400)
RBC: 3.73 MIL/uL — ABNORMAL LOW (ref 3.87–5.11)
RDW: 15.6 % — ABNORMAL HIGH (ref 11.5–15.5)
WBC: 11.3 10*3/uL — ABNORMAL HIGH (ref 4.0–10.5)
nRBC: 0.7 % — ABNORMAL HIGH (ref 0.0–0.2)

## 2021-07-28 LAB — MAGNESIUM: Magnesium: 2 mg/dL (ref 1.7–2.4)

## 2021-07-28 MED ORDER — K PHOS MONO-SOD PHOS DI & MONO 155-852-130 MG PO TABS
250.0000 mg | ORAL_TABLET | Freq: Two times a day (BID) | ORAL | Status: DC
  Administered 2021-07-28 – 2021-07-30 (×5): 250 mg via ORAL
  Filled 2021-07-28 (×6): qty 1

## 2021-07-28 MED ORDER — LISINOPRIL 20 MG PO TABS
20.0000 mg | ORAL_TABLET | Freq: Every day | ORAL | Status: DC
  Administered 2021-07-28: 20 mg via ORAL

## 2021-07-28 MED ORDER — AMLODIPINE BESYLATE 10 MG PO TABS
10.0000 mg | ORAL_TABLET | Freq: Every day | ORAL | Status: DC
  Administered 2021-07-28 – 2021-07-30 (×3): 10 mg via ORAL
  Filled 2021-07-28 (×3): qty 1

## 2021-07-28 NOTE — Progress Notes (Signed)
?PROGRESS NOTE ? ? ? ?Sharon Hunt  HAL:[id number] DOB: [birth year] DOA: 07/25/2021 ?PCP: Dorena Dew, FNP  ? ? ?Chief Complaint  ?Patient presents with  ? Hematemesis  ? ? ?Brief Narrative:  ?Patient 51 year old female history of significant alcohol abuse, GERD, hypertension, tobacco abuse, hyperlipidemia, history of abnormal uterine bleeding with leiomyoma of the uterus, history of QT prolongation presented to the ED with complaints of coffee-ground emesis, hematemesis x3 days.  Patient noted to have had large volumes of blood 2 to 3 days ago prior to admission which is tapered down.  Patient with significant alcohol use on a daily basis.  Patient seen in the ED admitted for evaluation of GI bleed.  Patient seen by gastroenterology and patient underwent upper endoscopy on 07/27/2021. ? ? ?Assessment & Plan: ?  ?Principal Problem: ?  GI bleeding ?Active Problems: ?  Alcohol abuse ?  Hypokalemia ?  Hypomagnesemia ?  GERD (gastroesophageal reflux disease) ?  HTN (hypertension) ?  Esophagitis ? ?#1 vomiting induced upper GI bleed/esophagitis ? -Patient presented with concerns for hematemesis/coffee-ground emesis. ?-Patient with history of significant/heavy alcohol use. ?-Patient with no prior history of varices. ?-Patient admitted, placed on IV PPI twice daily and made NPO on admission.. ?-GI consulted and patient underwent upper endoscopy 07/27/2021 which showed LA grade a reflux esophagitis with no active bleeding noted, widely patent Schatzki's ring, congested and erythematous mucosa of the gastric fundus and body.  Biopsies taken. ?-Patient with no further hematemesis or coffee-ground emesis. ?-Tolerating current diet.   ?-Increase oral PPI to twice daily. ?-Supportive care. ? ?2.  Hypomagnesemia/hypokalemia ?-Likely secondary to GI losses. ?-Potassium of 4.2, magnesium at 2.0. ?-Repeat labs in AM. ? ?3.  GERD ?-PPI. ? ?4.  Alcohol abuse ?-Alcohol cessation stressed to patient. ?-Continue Librium detox  protocol.   ?-TOC consulted.   ? ?5.  Hypertension ?-Antihypertensive medications initially held. ?-Patient stated used to be on antihypertensive medications however doses subsequently discontinued when her blood pressure got better. ?-Increase Norvasc to 10 mg daily.   ?-Start lisinopril 20 mg daily and uptitrate for better blood pressure control. ? ?6.  History of alcohol abuse ?-Patient with significant heavy alcohol use and noted per admitting physician to drink daily for the past 5 to 6 years. ?-Patient was on the Ativan withdrawal protocol. ?-Continue the Librium detox protocol. ?-Continue folic acid, thiamine, multivitamin, ? ? ?DVT prophylaxis: SCDs ?Code Status: Full ?Family Communication: Updated patient.  No family at bedside ?Disposition:  ? ?Status is: Inpatient ?Remains inpatient appropriate because: Severity of illness ?  ?Consultants:  ?Gastroenterology: Dr.Danis III 07/27/2018 ? ?Procedures:  ?CT abdomen pelvis 07/26/2021 ?Chest x-ray 07/26/2021 ?EGD/upper endoscopy.  Dr.Danis III 07/27/2021 ? ? ?Antimicrobials: ?None ? ? ?Subjective: ?Patient sitting in chair.  Still with some bouts of nausea and some occasionally spitting up some mucus.  No chest pain.  No shortness of breath.  Still with abdominal pain on the left side however slowly improving.  Able to keep food down.  No further hematemesis.  Son at bedside.   ? ?Objective: ?Vitals:  ? 07/28/21 0200 07/28/21 0418 07/28/21 0547 07/28/21 0739  ?BP: (!) 189/110 (!) 182/98 (!) 179/104 (!) 167/107  ?Pulse: 79 82 85 92  ?Resp: '18 18 16 16  '$ ?Temp: 99.5 ?F (37.5 ?C) 99.4 ?F (37.4 ?C) 100 ?F (37.8 ?C) 98.8 ?F (37.1 ?C)  ?TempSrc: Oral Oral Oral Oral  ?SpO2: 95% 97% 98% 97%  ?Weight:      ?Height:      ? ? ?  Intake/Output Summary (Last 24 hours) at 07/28/2021 1037 ?Last data filed at 07/28/2021 0422 ?Gross per 24 hour  ?Intake 2217.02 ml  ?Output 1000 ml  ?Net 1217.02 ml  ? ? ?Filed Weights  ? 07/25/21 2351  ?Weight: 79.4 kg  ? ? ?Examination: ? ?General exam:  NAD. ?Respiratory system: CTA B.  No wheezes, no crackles, no rhonchi.  Normal respiratory effort. ?Cardiovascular system: Regular rate rhythm no murmurs rubs or gallops.  No JVD.  No lower extremity edema. ?Gastrointestinal system: Abdomen is soft, nondistended, some tenderness to palpation in the epigastrium left upper quadrant left lower quadrant.  Positive bowel sounds.  No rebound.  No guarding.   ?Central nervous system: Alert and oriented. No focal neurological deficits. ?Extremities: Symmetric 5 x 5 power. ?Skin: No rashes, lesions or ulcers ?Psychiatry: Judgement and insight appear normal. Mood & affect appropriate.  ? ? ? ?Data Reviewed: I have personally reviewed following labs and imaging studies ? ?CBC: ?Recent Labs  ?Lab 07/26/21 ?0415 07/26/21 ?1526 07/27/21 ?1025 07/28/21 ?0205  ?WBC 15.3* 13.4* 12.7* 11.3*  ?NEUTROABS 11.7*  --   --  7.1  ?HGB 13.9 15.9* 13.3 13.1  ?HCT 37.6 42.4 36.7 35.9*  ?MCV 94.5 94.2 96.3 96.2  ?PLT 214 197 199 200  ? ? ? ?Basic Metabolic Panel: ?Recent Labs  ?Lab 07/26/21 ?0119 07/26/21 ?0807 07/26/21 ?1526 07/27/21 ?8527 07/28/21 ?0205  ?NA 139 137 139 138 139  ?K 2.7* 3.2* 3.2* 3.7 4.2  ?CL 95* 97* 100 103 108  ?CO2 '24 25 28 26 23  '$ ?GLUCOSE 137* 97 95 70 88  ?BUN <5* <5* <5* <5* <5*  ?CREATININE 0.79 0.88 0.78 0.70 0.65  ?CALCIUM 9.3 8.5* 8.1* 7.9* 8.3*  ?MG 1.4* 1.8 1.8 1.9 2.0  ?PHOS  --   --   --   --  2.4*  ? ? ? ?GFR: ?Estimated Creatinine Clearance: 86.7 mL/min (by C-G formula based on SCr of 0.65 mg/dL). ? ?Liver Function Tests: ?Recent Labs  ?Lab 07/26/21 ?0119 07/28/21 ?0205  ?AST 126*  --   ?ALT 53*  --   ?ALKPHOS 132*  --   ?BILITOT 2.5*  --   ?PROT 7.1  --   ?ALBUMIN 3.5 2.7*  ? ? ? ?CBG: ?No results for input(s): GLUCAP in the last 168 hours. ? ? ?Recent Results (from the past 240 hour(s))  ?Resp Panel by RT-PCR (Flu A&B, Covid) Nasopharyngeal Swab     Status: None  ? Collection Time: 07/26/21  8:49 AM  ? Specimen: Nasopharyngeal Swab; Nasopharyngeal(NP) swabs  in vial transport medium  ?Result Value Ref Range Status  ? SARS Coronavirus 2 by RT PCR NEGATIVE NEGATIVE Final  ?  Comment: (NOTE) ?SARS-CoV-2 target nucleic acids are NOT DETECTED. ? ?The SARS-CoV-2 RNA is generally detectable in upper respiratory ?specimens during the acute phase of infection. The lowest ?concentration of SARS-CoV-2 viral copies this assay can detect is ?138 copies/mL. A negative result does not preclude SARS-Cov-2 ?infection and should not be used as the sole basis for treatment or ?other patient management decisions. A negative result may occur with  ?improper specimen collection/handling, submission of specimen other ?than nasopharyngeal swab, presence of viral mutation(s) within the ?areas targeted by this assay, and inadequate number of viral ?copies(<138 copies/mL). A negative result must be combined with ?clinical observations, patient history, and epidemiological ?information. The expected result is Negative. ? ?Fact Sheet for Patients:  ?EntrepreneurPulse.com.au ? ?Fact Sheet for Healthcare Providers:  ?IncredibleEmployment.be ? ?This test is no t  yet approved or cleared by the [place] and  ?has been authorized for detection and/or diagnosis of SARS-CoV-2 by ?FDA under an Emergency Use Authorization (EUA). This EUA will remain  ?in effect (meaning this test can be used) for the duration of the ?COVID-19 declaration under Section 564(b)(1) of the Act, 21 ?U.S.C.section 360bbb-3(b)(1), unless the authorization is terminated  ?or revoked sooner.  ? ? ?  ? Influenza A by PCR NEGATIVE NEGATIVE Final  ? Influenza B by PCR NEGATIVE NEGATIVE Final  ?  Comment: (NOTE) ?The Xpert Xpress SARS-CoV-2/FLU/RSV plus assay is intended as an aid ?in the diagnosis of influenza from Nasopharyngeal swab specimens and ?should not be used as a sole basis for treatment. Nasal washings and ?aspirates are unacceptable for Xpert Xpress  SARS-CoV-2/FLU/RSV ?testing. ? ?Fact Sheet for Patients: ?EntrepreneurPulse.com.au ? ?Fact Sheet for Healthcare Providers: ?IncredibleEmployment.be ? ?This test is not yet approved or cleared by the [facility]

## 2021-07-28 NOTE — Evaluation (Signed)
Occupational Therapy Evaluation ?Patient Details ?Name: Sharon Hunt ?MRN: [id number] ?DOB: Jan 17, 1971 ?Today's Date: 07/28/2021 ? ? ?History of Present Illness 51 y/o female presented to ED on 07/26/21 for bloody emesis x 3 days. EGD showed esophagitis with possible GI bleed. PMH: alcohol abuse, HTN, tobacco abuse  ? ?Clinical Impression ?  ?Pt admitted for concerns listed above. PTA Pt reported that she was independent with all ADL's and IADL's, including taking care of her 42 y.o. grandson. At this time, pt presents with increased weakness, balance deficits, decreased activity tolerance, and slow processing. She is requiring min guard to min A for all functional mobility and ADL's. OT will continue to follow acutely to address concerns listed above.   ?   ? ?Recommendations for follow up therapy are one component of a multi-disciplinary discharge planning process, led by the attending physician.  Recommendations may be updated based on patient status, additional functional criteria and insurance authorization.  ? ?Follow Up Recommendations ? No OT follow up  ?  ?Assistance Recommended at Discharge Set up Supervision/Assistance  ?Patient can return home with the following A little help with walking and/or transfers;A little help with bathing/dressing/bathroom ? ?  ?Functional Status Assessment ? Patient has had a recent decline in their functional status and demonstrates the ability to make significant improvements in function in a reasonable and predictable amount of time.  ?Equipment Recommendations ? Tub/shower bench  ?  ?Recommendations for Other Services   ? ? ?  ?Precautions / Restrictions Precautions ?Precautions: Fall ?Restrictions ?Weight Bearing Restrictions: No  ? ?  ? ?Mobility Bed Mobility ?Overal bed mobility: Modified Independent ?  ?  ?  ?  ?  ?  ?  ?  ? ?Transfers ?Overall transfer level: Needs assistance ?Equipment used: None ?Transfers: Sit to/from Stand ?Sit to Stand: Min assist ?  ?  ?  ?  ?   ?General transfer comment: minA to rise and steady ?  ? ?  ?Balance Overall balance assessment: Mild deficits observed, not formally tested ?  ?  ?  ?  ?  ?  ?  ?  ?  ?  ?  ?  ?  ?  ?  ?  ?  ?  ?   ? ?ADL either performed or assessed with clinical judgement  ? ?ADL Overall ADL's : Needs assistance/impaired ?  ?  ?  ?  ?  ?  ?  ?  ?  ?  ?  ?  ?  ?  ?  ?  ?  ?  ?  ?General ADL Comments: Min guard for all ADL's due to instability  ? ? ? ?Vision Baseline Vision/History: 1 Wears glasses ?Ability to See in Adequate Light: 0 Adequate ?Patient Visual Report: No change from baseline ?Vision Assessment?: No apparent visual deficits  ?   ?Perception   ?  ?Praxis   ?  ? ?Pertinent Vitals/Pain Pain Assessment ?Pain Assessment: No/denies pain  ? ? ? ?Hand Dominance Right ?  ?Extremity/Trunk Assessment Upper Extremity Assessment ?Upper Extremity Assessment: Generalized weakness ?  ?Lower Extremity Assessment ?Lower Extremity Assessment: Defer to PT evaluation ?  ?Cervical / Trunk Assessment ?Cervical / Trunk Assessment: Normal ?  ?Communication Communication ?Communication: No difficulties ?  ?Cognition Arousal/Alertness: Awake/alert ?Behavior During Therapy: [facility] for tasks assessed/performed ?Overall Cognitive Status: No family/caregiver present to determine baseline cognitive functioning ?  ?  ?  ?  ?  ?  ?  ?  ?  ?  ?  ?  ?  ?  ?  ?  ?  General Comments: slow processing and slow to respond at times ?  ?  ?General Comments  VSS on RA - tremors noted, as well as dizziness/nystagmus in standing after a few mins. ? ?  ?Exercises   ?  ?Shoulder Instructions    ? ? ?Home Living Family/patient expects to be discharged to:: Private residence ?Living Arrangements: Children (daughter and 1 y/o grandson) ?Available Help at Discharge: Family ?Type of Home: House ?Home Access: Stairs to enter ?Entrance Stairs-Number of Steps: 8 ?Entrance Stairs-Rails: Right;Left ?Home Layout: One level ?  ?  ?Bathroom Shower/Tub: Tub/shower unit ?  ?Bathroom  Toilet: Standard ?  ?  ?Home Equipment: None ?  ?  ?  ? ?  ?Prior Functioning/Environment Prior Level of Function : Independent/Modified Independent ?  ?  ?  ?  ?  ?  ?  ?  ?  ? ?  ?  ?OT Problem List: Decreased strength;Decreased activity tolerance;Impaired balance (sitting and/or standing);Decreased safety awareness ?  ?   ?OT Treatment/Interventions: Self-care/ADL training;Therapeutic exercise;Energy conservation;DME and/or AE instruction;Therapeutic activities;Balance training;Patient/family education  ?  ?OT Goals(Current goals can be found in the care plan section) Acute Rehab OT Goals ?Patient Stated Goal: To go home ?OT Goal Formulation: With patient ?Time For Goal Achievement: 08/11/21 ?Potential to Achieve Goals: Good ?ADL Goals ?Pt Will Perform Grooming: Independently;standing ?Pt Will Perform Lower Body Bathing: Independently;sit to/from stand;sitting/lateral leans ?Pt Will Perform Lower Body Dressing: Independently;sitting/lateral leans;sit to/from stand ?Pt Will Transfer to Toilet: Independently;ambulating ?Pt Will Perform Toileting - Clothing Manipulation and hygiene: Independently;sitting/lateral leans;sit to/from stand  ?OT Frequency: Min 2X/week ?  ? ?Co-evaluation   ?  ?  ?  ?  ? ?  ?AM-PAC OT "6 Clicks" Daily Activity     ?Outcome Measure Help from another person eating meals?: A Little ?Help from another person taking care of personal grooming?: A Little ?Help from another person toileting, which includes using toliet, bedpan, or urinal?: A Little ?Help from another person bathing (including washing, rinsing, drying)?: A Little ?Help from another person to put on and taking off regular upper body clothing?: A Little ?Help from another person to put on and taking off regular lower body clothing?: A Little ?6 Click Score: 18 ?  ?End of Session Equipment Utilized During Treatment: Gait belt;Rolling walker (2 wheels) ?Nurse Communication: Mobility status ? ?Activity Tolerance: Patient tolerated  treatment well ?Patient left: in bed;with call bell/phone within reach ? ?OT Visit Diagnosis: Unsteadiness on feet (R26.81);Other abnormalities of gait and mobility (R26.89);Muscle weakness (generalized) (M62.81)  ?              ?Time: 8416-6063 ?OT Time Calculation (min): 14 min ?Charges:  OT General Charges ?$OT Visit: 1 Visit ?OT Evaluation ?$OT Eval Moderate Complexity: 1 Mod ? ?Shanie Mauzy H., OTR/L ?Acute Rehabilitation ? ?Arda Daggs Elane Yolanda Bonine ?07/28/2021, 5:54 PM ?

## 2021-07-28 NOTE — Evaluation (Signed)
Physical Therapy Evaluation ?Patient Details ?Name: Sharon Hunt ?MRN: [id number] ?DOB: Jun 04, 1970 ?Today's Date: 07/28/2021 ? ?History of Present Illness ? 51 y/o female presented to ED on 07/26/21 for bloody emesis x 3 days. EGD showed esophagitis with possible GI bleed. PMH: alcohol abuse, HTN, tobacco abuse  ?Clinical Impression ? Patient admitted with the above. Patient presents with weakness, impaired balance, and impaired cognition. Patient requiring minA for sit to stand and short ambulation with HHA prior to needing to sit due to dizziness. PTA, patient lives with daughter and 1 y/o grandson and was very independent. Currently, patient is not at her baseline. Patient will benefit from skilled PT services during acute stay to address listed deficits. Recommend OPPT at discharge to address balance and strength deficits. Educated patient on need for daughter or other family member to be present during mobility for safety as she is unsteady, patient verbalized understanding.    ?   ? ?Recommendations for follow up therapy are one component of a multi-disciplinary discharge planning process, led by the attending physician.  Recommendations may be updated based on patient status, additional functional criteria and insurance authorization. ? ?Follow Up Recommendations Outpatient PT ? ?  ?Assistance Recommended at Discharge Intermittent Supervision/Assistance  ?Patient can return home with the following ? A little help with bathing/dressing/bathroom;Help with stairs or ramp for entrance;Direct supervision/assist for financial management;Direct supervision/assist for medications management;Assistance with cooking/housework ? ?  ?Equipment Recommendations Rolling Trista Ciocca (2 wheels)  ?Recommendations for Other Services ?    ?  ?Functional Status Assessment Patient has had a recent decline in their functional status and demonstrates the ability to make significant improvements in function in a reasonable and predictable  amount of time.  ? ?  ?Precautions / Restrictions Precautions ?Precautions: Fall ?Restrictions ?Weight Bearing Restrictions: No  ? ?  ? ?Mobility ? Bed Mobility ?Overal bed mobility: Modified Independent ?  ?  ?  ?  ?  ?  ?  ?  ? ?Transfers ?Overall transfer level: Needs assistance ?Equipment used: None ?Transfers: Sit to/from Stand ?Sit to Stand: Min assist ?  ?  ?  ?  ?  ?General transfer comment: minA to rise and steady ?  ? ?Ambulation/Gait ?Ambulation/Gait assistance: Min assist ?Gait Distance (Feet): 12 Feet ?Assistive device: 1 person hand held assist ?Gait Pattern/deviations: Step-through pattern, Decreased stride length, Drifts right/left ?Gait velocity: decreased ?  ?  ?General Gait Details: distance limited by IV pole without charge and connected to patient. MinA for balance with short mobility. Patient drifting L/R and reporting dizziness requiring her to sit. No nystagmus noted. ? ?Stairs ?  ?  ?  ?  ?  ? ?Wheelchair Mobility ?  ? ?Modified Rankin (Stroke Patients Only) ?  ? ?  ? ?Balance Overall balance assessment: Mild deficits observed, not formally tested ?  ?  ?  ?  ?  ?  ?  ?  ?  ?  ?  ?  ?  ?  ?  ?  ?  ?  ?   ? ? ? ?Pertinent Vitals/Pain Pain Assessment ?Pain Assessment: No/denies pain  ? ? ?Home Living Family/patient expects to be discharged to:: Private residence ?Living Arrangements: Children (daughter and 1 y/o grandson) ?Available Help at Discharge: Family ?Type of Home: House ?Home Access: Stairs to enter ?Entrance Stairs-Rails: Right;Left ?Entrance Stairs-Number of Steps: 8 ?  ?Home Layout: One level ?Home Equipment: None ?   ?  ?Prior Function Prior Level of Function : Independent/Modified Independent ?  ?  ?  ?  ?  ?  ?  ?  ?  ? ? ?  Hand Dominance  ?   ? ?  ?Extremity/Trunk Assessment  ? Upper Extremity Assessment ?Upper Extremity Assessment: Defer to OT evaluation ?  ? ?Lower Extremity Assessment ?Lower Extremity Assessment: Generalized weakness ?  ? ?Cervical / Trunk  Assessment ?Cervical / Trunk Assessment: Normal  ?Communication  ? Communication: No difficulties  ?Cognition Arousal/Alertness: Awake/alert ?Behavior During Therapy: [facility] for tasks assessed/performed ?Overall Cognitive Status: No family/caregiver present to determine baseline cognitive functioning ?  ?  ?  ?  ?  ?  ?  ?  ?  ?  ?  ?  ?  ?  ?  ?  ?General Comments: slow processing and slow to respond at times ?  ?  ? ?  ?General Comments   ? ?  ?Exercises    ? ?Assessment/Plan  ?  ?PT Assessment Patient needs continued PT services  ?PT Problem List Decreased strength;Decreased activity tolerance;Decreased balance;Decreased mobility;Decreased cognition ? ?   ?  ?PT Treatment Interventions DME instruction;Gait training;Functional mobility training;Therapeutic activities;Therapeutic exercise;Balance training;Stair training;Patient/family education   ? ?PT Goals (Current goals can be found in the Care Plan section)  ?Acute Rehab PT Goals ?Patient Stated Goal: to go home ?PT Goal Formulation: With patient ?Time For Goal Achievement: 08/11/21 ?Potential to Achieve Goals: Good ? ?  ?Frequency Min 3X/week ?  ? ? ?Co-evaluation   ?  ?  ?  ?  ? ? ?  ?AM-PAC PT "6 Clicks" Mobility  ?Outcome Measure Help needed turning from your back to your side while in a flat bed without using bedrails?: None ?Help needed moving from lying on your back to sitting on the side of a flat bed without using bedrails?: None ?Help needed moving to and from a bed to a chair (including a wheelchair)?: A Little ?Help needed standing up from a chair using your arms (e.g., wheelchair or bedside chair)?: A Little ?Help needed to walk in hospital room?: A Little ?Help needed climbing 3-5 steps with a railing? : A Little ?6 Click Score: 20 ? ?  ?End of Session   ?Activity Tolerance: Patient tolerated treatment well ?Patient left: in bed;with call bell/phone within reach ?Nurse Communication: Mobility status ?PT Visit Diagnosis: Unsteadiness on feet  (R26.81);Muscle weakness (generalized) (M62.81) ?  ? ?Time: 1219-7588 ?PT Time Calculation (min) (ACUTE ONLY): 14 min ? ? ?Charges:   PT Evaluation ?$PT Eval Moderate Complexity: 1 Mod ?  ?  ?   ? ? ?Hargis Vandyne A. Gilford Rile, PT, DPT ?Acute Rehabilitation Services ?Pager [phone] ?Office [phone] ? ? ?Malaika Arnall A Randee Upchurch ?07/28/2021, 5:25 PM ? ?

## 2021-07-29 ENCOUNTER — Encounter (HOSPITAL_COMMUNITY): Payer: Self-pay | Admitting: Gastroenterology

## 2021-07-29 LAB — CBC WITH DIFFERENTIAL/PLATELET
Abs Immature Granulocytes: 0.44 10*3/uL — ABNORMAL HIGH (ref 0.00–0.07)
Basophils Absolute: 0.1 10*3/uL (ref 0.0–0.1)
Basophils Relative: 1 %
Eosinophils Absolute: 0.2 10*3/uL (ref 0.0–0.5)
Eosinophils Relative: 1 %
HCT: 38 % (ref 36.0–46.0)
Hemoglobin: 13.9 g/dL (ref 12.0–15.0)
Immature Granulocytes: 4 %
Lymphocytes Relative: 24 %
Lymphs Abs: 2.9 10*3/uL (ref 0.7–4.0)
MCH: 35.2 pg — ABNORMAL HIGH (ref 26.0–34.0)
MCHC: 36.6 g/dL — ABNORMAL HIGH (ref 30.0–36.0)
MCV: 96.2 fL (ref 80.0–100.0)
Monocytes Absolute: 0.8 10*3/uL (ref 0.1–1.0)
Monocytes Relative: 6 %
Neutro Abs: 7.7 10*3/uL (ref 1.7–7.7)
Neutrophils Relative %: 64 %
Platelets: 197 10*3/uL (ref 150–400)
RBC: 3.95 MIL/uL (ref 3.87–5.11)
RDW: 15.8 % — ABNORMAL HIGH (ref 11.5–15.5)
WBC: 12 10*3/uL — ABNORMAL HIGH (ref 4.0–10.5)
nRBC: 0.6 % — ABNORMAL HIGH (ref 0.0–0.2)

## 2021-07-29 LAB — BASIC METABOLIC PANEL
Anion gap: 11 (ref 5–15)
BUN: <5 mg/dL — ABNORMAL LOW (ref 6–20)
CO2: 26 mmol/L (ref 22–32)
Calcium: 9.3 mg/dL (ref 8.9–10.3)
Chloride: 103 mmol/L (ref 98–111)
Creatinine, Ser: 0.71 mg/dL (ref 0.44–1.00)
GFR, Estimated: >60 mL/min (ref >60)
Glucose, Bld: 111 mg/dL — ABNORMAL HIGH (ref 70–99)
Potassium: 4 mmol/L (ref 3.5–5.1)
Sodium: 140 mmol/L (ref 135–145)

## 2021-07-29 LAB — MAGNESIUM: Magnesium: 1.8 mg/dL (ref 1.7–2.4)

## 2021-07-29 MED ORDER — FLUCONAZOLE 150 MG PO TABS
150.0000 mg | ORAL_TABLET | Freq: Once | ORAL | Status: AC
  Administered 2021-07-29: 150 mg via ORAL
  Filled 2021-07-29: qty 1

## 2021-07-29 MED ORDER — LISINOPRIL 20 MG PO TABS
40.0000 mg | ORAL_TABLET | Freq: Every day | ORAL | Status: DC
  Administered 2021-07-29 – 2021-07-30 (×2): 40 mg via ORAL
  Filled 2021-07-29 (×2): qty 2

## 2021-07-29 MED ORDER — CLOTRIMAZOLE 1 % VA CREA
1.0000 | TOPICAL_CREAM | Freq: Every day | VAGINAL | Status: DC
  Administered 2021-07-29: 1 via VAGINAL
  Filled 2021-07-29: qty 45

## 2021-07-29 MED ORDER — CLOTRIMAZOLE 2 % VA CREA: 1.0000 | TOPICAL_CREAM | Freq: Every day | VAGINAL | Status: DC

## 2021-07-29 MED ORDER — GABAPENTIN 100 MG PO CAPS
100.0000 mg | ORAL_CAPSULE | Freq: Three times a day (TID) | ORAL | Status: DC
Start: 2021-07-29 — End: 2021-07-30
  Administered 2021-07-29 – 2021-07-30 (×5): 100 mg via ORAL
  Filled 2021-07-29 (×5): qty 1

## 2021-07-29 MED ORDER — HYDROCERIN EX CREA
TOPICAL_CREAM | Freq: Two times a day (BID) | CUTANEOUS | Status: DC
  Administered 2021-07-30: 1 via TOPICAL
  Filled 2021-07-29: qty 113

## 2021-07-29 NOTE — TOC CAGE-AID Note (Signed)
Transition of Care (TOC) - CAGE-AID Screening ? ? ?Patient Details  ?Name: Sharon Hunt ?MRN: [id number] ?Date of Birth: [birth year] ? ?Transition of Care (TOC) CM/SW Contact:    ?Marilu Favre, RN ?Phone Number: ?07/29/2021, 12:59 PM ? ? ?Clinical Narrative: ? ?Resources provided  ? ?CAGE-AID Screening: ?  ? ?Have You Ever Felt You Ought to Cut Down on Your Drinking or Drug Use?: Yes (drinking) ?Have People Annoyed You By Critizing Your Drinking Or Drug Use?: No ?Have You Felt Bad Or Guilty About Your Drinking Or Drug Use?: No ?Have You Ever Had a Drink or Used Drugs First Thing In The Morning to Steady Your Nerves or to Get Rid of a Hangover?: Yes ?CAGE-AID Score: 2 ? ?Substance Abuse Education Offered: Yes ? ?Substance abuse interventions: Scientist, clinical (histocompatibility and immunogenetics), Patient Counseling ? ? ? ? ? ? ?

## 2021-07-29 NOTE — TOC Initial Note (Addendum)
Transition of Care (TOC) - Initial/Assessment Note  ? ? ?Patient Details  ?Name: Sharon Hunt ?MRN: [id number] ?Date of Birth: 21-Dec-1970 ? ?Transition of Care (TOC) CM/SW Contact:    ?Marilu Favre, RN ?Phone Number: ?07/29/2021, 1:16 PM ? ?Clinical Narrative:                 ?Spoke to patient at bedside. Confirmed face sheet information.  ?She has not seen PCP in "years"  ? ?Patient is uninsured. Discussed TOC Pharmacy . At discharge [facility] will call patient with cost of medications and deliver to room prior to discharge. Patient agreeable.  ? ?Patient would like to be set up with [facility] to establish care and then use their pharmacy . NCM called scheduled appointment for August 22, 2021 at 1030 am Information on  AVS.  ? ?Discussed OP PT  and locations. with patient, patient would like [facility] location. Order entered and placed on AVS.  ? ?PT/OT recommending walker and tub shower bench. Patient agreeable if DME can be provided through charity. NCM called Mardene Celeste with [employer] for DME and charity . ? ? ?Expected Discharge Plan: Home/Self Care ?Barriers to Discharge: Continued Medical Work up ? ? ?Patient Goals and CMS Choice ?Patient states their goals for this hospitalization and ongoing recovery are:: to return to home ?CMS Medicare.gov Compare Post Acute Care list provided to:: Patient ?Choice offered to / list presented to : Patient ? ?Expected Discharge Plan and Services ?Expected Discharge Plan: Home/Self Care ?  ?Discharge Planning Services: CM Consult ?Post Acute Care Choice:  (OP PT) ?Living arrangements for the past 2 months: [place] ?                ?DME Arranged: Tub bench, Walker rolling ?DME Agency: [employer] ?Date DME Agency Contacted: 07/29/21 ?Time DME Agency Contacted: [phone] ?Representative spoke with at DME Agency: Mardene Celeste ?HH Arranged: NA ?  ?  ?  ?  ? ?Prior Living Arrangements/Services ?Living arrangements for the past 2 months: [place] ?Lives with:: Adult Children ?Patient language and need for interpreter reviewed:: Yes ?Do you feel safe going back to the place where you live?: Yes      ?Need for Family Participation in Patient Care: Yes (Comment) ?Care giver support system in place?: Yes (comment) ?  ?Criminal Activity/Legal Involvement Pertinent to Current Situation/Hospitalization: No - Comment as needed ? ?Activities of Sharon Living ?  ?  ? ?Permission Sought/Granted ?  ?Permission granted to share information with : No ?   ?   ?   ?   ? ?Emotional Assessment ?Appearance:: Appears stated age ?Attitude/Demeanor/Rapport: Engaged ?Affect (typically observed): Accepting ?Orientation: : Oriented to Situation, Oriented to  Time, Oriented to Place, Oriented to Self ?Alcohol / Substance Use: Not Applicable ?Psych Involvement: No (comment) ? ?Admission diagnosis:  Hypokalemia [E87.6] ?GI bleeding [K92.2] ?Hematemesis, unspecified whether nausea present [K92.0] ?Patient Active Problem List  ? Diagnosis Date Noted  ? Esophagitis 07/28/2021  ? GI bleeding 07/26/2021  ? GERD (gastroesophageal reflux disease) 07/26/2021  ? Hypokalemia 01/06/2019  ? Hypomagnesemia 01/06/2019  ? QT prolongation 01/06/2019  ? Orthostatic hypotension 01/06/2019  ? Leukocytosis 01/06/2019  ? Metabolic acidosis 35/36/1443  ? AKI (acute kidney injury) ([place]) 01/06/2019  ? Hunt (dyspnea on exertion) 01/06/2019  ? Dysmenorrhea 03/07/2013  ? Leiomyoma of uterus, unspecified 03/07/2013  ? Abnormal uterine bleeding (AUB) 03/07/2013  ? Routine gynecological examination 03/07/2013  ? Hyperlipidemia 08/22/2011  ? Left-sided weakness  08/21/2011  ? Alcohol abuse 08/21/2011  ? HTN (hypertension) 08/21/2011  ? Tobacco abuse 08/21/2011  ? Low vitamin B12 level 08/21/2011  ? Iron deficiency 08/21/2011  ? Alcohol intoxication ([place]) 08/21/2011  ? ?PCP:  Dorena Dew, FNP ?Pharmacy:   ?[facility] ?[address] ?[place] [address] ?Phone: [phone] Fax:  [phone] ? ?[facility] ?[address] ?[place] [address] ?Phone: [phone] Fax: [phone] ? ? ? ? ?Social Determinants of Health (SDOH) Interventions ?  ? ?Readmission Risk Interventions ?   ? View : No data to display.  ?  ?  ?  ? ? ? ?

## 2021-07-29 NOTE — Progress Notes (Signed)
?PROGRESS NOTE ? ? ? ?Sharon Hunt  PYK:[id number] DOB: 27-Aug-1970 DOA: 07/25/2021 ?PCP: Argentina Donovan, PA-C  ? ? ?Chief Complaint  ?Patient presents with  ? Hematemesis  ? ? ?Brief Narrative:  ?Patient 51 year old female history of significant alcohol abuse, GERD, hypertension, tobacco abuse, hyperlipidemia, history of abnormal uterine bleeding with leiomyoma of the uterus, history of QT prolongation presented to the ED with complaints of coffee-ground emesis, hematemesis x3 days.  Patient noted to have had large volumes of blood 2 to 3 days ago prior to admission which is tapered down.  Patient with significant alcohol use on a daily basis.  Patient seen in the ED admitted for evaluation of GI bleed.  Patient seen by gastroenterology and patient underwent upper endoscopy on 07/27/2021. ? ? ?Assessment & Plan: ?  ?Principal Problem: ?  GI bleeding ?Active Problems: ?  Alcohol abuse ?  Hypokalemia ?  Hypomagnesemia ?  GERD (gastroesophageal reflux disease) ?  HTN (hypertension) ?  Esophagitis ? ?#1 vomiting induced upper GI bleed/esophagitis ? -Patient presented with concerns for hematemesis/coffee-ground emesis. ?-Patient with history of significant/heavy alcohol use. ?-Patient with no prior history of varices. ?-Patient admitted, placed on IV PPI twice daily and made NPO on admission.. ?-GI consulted and patient underwent upper endoscopy 07/27/2021 which showed LA grade A reflux esophagitis with no active bleeding noted, widely patent Schatzki's ring, congested and erythematous mucosa of the gastric fundus and body.  Biopsies taken. ?-Patient with no further hematemesis or coffee-ground emesis. ?-Tolerating current diet.   ?-Continue PPI daily.  ?-Supportive care. ? ?2.  Hypomagnesemia/hypokalemia ?-Likely secondary to GI losses. ?-Potassium of 4.0, magnesium at 1.8. ?-Repeat labs in the AM. ? ?3.  GERD ?-PPI. ? ?4.  Alcohol abuse ?-Alcohol cessation stressed to patient. ?-Continue Librium detox protocol.    ?-TOC consulted.   ? ?5.  Hypertension ?-Antihypertensive medications initially held. ?-Patient stated used to be on antihypertensive medications however doses subsequently discontinued when her blood pressure got better. ?-Continue Norvasc 10 mg daily.   ?-Increase lisinopril to 40 mg daily.   ?-Will need close outpatient follow-up with PCP. ? ?6.  History of alcohol abuse ?-Patient with significant heavy alcohol use and noted per admitting physician to drink daily for the past 5 to 6 years. ?-Patient was on the Ativan withdrawal protocol. ?-Continue the Librium detox protocol. ?-Continue folic acid, thiamine, multivitamin, ?-Alcohol cessation stressed to patient. ?-TOC consult placed. ? ? ?DVT prophylaxis: SCDs ?Code Status: Full ?Family Communication: Updated patient.  No family at bedside ?Disposition: Likely home tomorrow. ? ?Status is: Inpatient ?Remains inpatient appropriate because: Severity of illness ?  ?Consultants:  ?Gastroenterology: Dr.Danis III 07/27/2018 ? ?Procedures:  ?CT abdomen pelvis 07/26/2021 ?Chest x-ray 07/26/2021 ?EGD/upper endoscopy.  Dr.Danis III 07/27/2021 ? ? ?Antimicrobials: ?None ? ? ?Subjective: ?Sitting up in bed eating lunch.  Denies any further hematemesis.  Nausea improving.  Tolerating current diet.  No chest pain.  No shortness of breath.  Appreciative of the care she is receiving.  ? ?Objective: ?Vitals:  ? 07/28/21 1600 07/28/21 2045 07/29/21 0403 07/29/21 0828  ?BP: (!) 173/92 (!) 172/92 (!) 164/102 (!) 168/98  ?Pulse: 92 91 87 94  ?Resp: '16 17 18 16  '$ ?Temp: 98.9 ?F (37.2 ?C) 99.8 ?F (37.7 ?C) (!) 97.5 ?F (36.4 ?C) 98.2 ?F (36.8 ?C)  ?TempSrc: Oral Oral  Oral  ?SpO2: 100% 97% 98% 97%  ?Weight:      ?Height:      ? ? ?Intake/Output Summary (Last 24 hours)  at 07/29/2021 1658 ?Last data filed at 07/28/2021 2114 ?Gross per 24 hour  ?Intake 100 ml  ?Output --  ?Net 100 ml  ? ? ?Filed Weights  ? 07/25/21 2351  ?Weight: 79.4 kg  ? ? ?Examination: ? ?General exam: NAD. ?Respiratory  system: Lungs clear to auscultation bilaterally.  No wheezes, no crackles, no rhonchi.  Normal respiratory effort. ?Cardiovascular system: RRR no murmurs rubs or gallops.  No JVD.  No lower extremity edema. ?Gastrointestinal system: Abdomen is soft, nondistended, some tenderness to palpation in the epigastrium left upper quadrant left lower quadrant.  Positive bowel sounds.  No rebound.  No guarding.   ?Central nervous system: Alert and oriented. No focal neurological deficits. ?Extremities: Symmetric 5 x 5 power. ?Skin: No rashes, lesions or ulcers ?Psychiatry: Judgement and insight appear normal. Mood & affect appropriate.  ? ? ? ?Data Reviewed: I have personally reviewed following labs and imaging studies ? ?CBC: ?Recent Labs  ?Lab 07/26/21 ?0415 07/26/21 ?1526 07/27/21 ?4431 07/28/21 ?0205 07/29/21 ?0350  ?WBC 15.3* 13.4* 12.7* 11.3* 12.0*  ?NEUTROABS 11.7*  --   --  7.1 7.7  ?HGB 13.9 15.9* 13.3 13.1 13.9  ?HCT 37.6 42.4 36.7 35.9* 38.0  ?MCV 94.5 94.2 96.3 96.2 96.2  ?PLT 214 197 199 200 197  ? ? ? ?Basic Metabolic Panel: ?Recent Labs  ?Lab 07/26/21 ?0807 07/26/21 ?1526 07/27/21 ?5400 07/28/21 ?0205 07/29/21 ?0350  ?NA 137 139 138 139 140  ?K 3.2* 3.2* 3.7 4.2 4.0  ?CL 97* 100 103 108 103  ?CO2 '25 28 26 23 26  '$ ?GLUCOSE 97 95 70 88 111*  ?BUN <5* <5* <5* <5* <5*  ?CREATININE 0.88 0.78 0.70 0.65 0.71  ?CALCIUM 8.5* 8.1* 7.9* 8.3* 9.3  ?MG 1.8 1.8 1.9 2.0 1.8  ?PHOS  --   --   --  2.4*  --   ? ? ? ?GFR: ?Estimated Creatinine Clearance: 86.7 mL/min (by C-G formula based on SCr of 0.71 mg/dL). ? ?Liver Function Tests: ?Recent Labs  ?Lab 07/26/21 ?0119 07/28/21 ?0205  ?AST 126*  --   ?ALT 53*  --   ?ALKPHOS 132*  --   ?BILITOT 2.5*  --   ?PROT 7.1  --   ?ALBUMIN 3.5 2.7*  ? ? ? ?CBG: ?No results for input(s): GLUCAP in the last 168 hours. ? ? ?Recent Results (from the past 240 hour(s))  ?Resp Panel by RT-PCR (Flu A&B, Covid) Nasopharyngeal Swab     Status: None  ? Collection Time: 07/26/21  8:49 AM  ? Specimen:  Nasopharyngeal Swab; Nasopharyngeal(NP) swabs in vial transport medium  ?Result Value Ref Range Status  ? SARS Coronavirus 2 by RT PCR NEGATIVE NEGATIVE Final  ?  Comment: (NOTE) ?SARS-CoV-2 target nucleic acids are NOT DETECTED. ? ?The SARS-CoV-2 RNA is generally detectable in upper respiratory ?specimens during the acute phase of infection. The lowest ?concentration of SARS-CoV-2 viral copies this assay can detect is ?138 copies/mL. A negative result does not preclude SARS-Cov-2 ?infection and should not be used as the sole basis for treatment or ?other patient management decisions. A negative result may occur with  ?improper specimen collection/handling, submission of specimen other ?than nasopharyngeal swab, presence of viral mutation(s) within the ?areas targeted by this assay, and inadequate number of viral ?copies(<138 copies/mL). A negative result must be combined with ?clinical observations, patient history, and epidemiological ?information. The expected result is Negative. ? ?Fact Sheet for Patients:  ?EntrepreneurPulse.com.au ? ?Fact Sheet for Healthcare Providers:  ?IncredibleEmployment.be ? ?This test  is no t yet approved or cleared by the [place] FDA and  ?has been authorized for detection and/or diagnosis of SARS-CoV-2 by ?FDA under an Emergency Use Authorization (EUA). This EUA will remain  ?in effect (meaning this test can be used) for the duration of the ?COVID-19 declaration under Section 564(b)(1) of the Act, 21 ?U.S.C.section 360bbb-3(b)(1), unless the authorization is terminated  ?or revoked sooner.  ? ? ?  ? Influenza A by PCR NEGATIVE NEGATIVE Final  ? Influenza B by PCR NEGATIVE NEGATIVE Final  ?  Comment: (NOTE) ?The Xpert Xpress SARS-CoV-2/FLU/RSV plus assay is intended as an aid ?in the diagnosis of influenza from Nasopharyngeal swab specimens and ?should not be used as a sole basis for treatment. Nasal washings and ?aspirates are unacceptable for  Xpert Xpress SARS-CoV-2/FLU/RSV ?testing. ? ?Fact Sheet for Patients: ?EntrepreneurPulse.com.au ? ?Fact Sheet for Healthcare Providers: ?IncredibleEmployment.be ? ?This test is

## 2021-07-29 NOTE — Progress Notes (Signed)
Physical Therapy Treatment ?Patient Details ?Name: Sharon Hunt ?MRN: [id number] ?DOB: [birth year] ?Today's Date: 07/29/2021 ? ? ?History of Present Illness 51 y/o female presented to ED on 07/26/21 for bloody emesis x 3 days. EGD showed esophagitis with possible GI bleed. PMH: alcohol abuse, HTN, tobacco abuse ? ?  ?PT Comments  ? ? Pt progressing towards her physical therapy goals, exhibiting improved activity tolerance and ambulation distance. Pt ambulating 250 ft with no assistive device at a min guard assist level. Negotiated a half flight of steps with R railing to simulate home environment. Pt continues with dynamic balance deficits; reports decreased sensation in bilateral feet. Would benefit from OPPT for further balance training.  ?   ?Recommendations for follow up therapy are one component of a multi-disciplinary discharge planning process, led by the attending physician.  Recommendations may be updated based on patient status, additional functional criteria and insurance authorization. ? ?Follow Up Recommendations ? Outpatient PT ?  ?  ?Assistance Recommended at Discharge Intermittent Supervision/Assistance  ?Patient can return home with the following A little help with bathing/dressing/bathroom;Help with stairs or ramp for entrance;Direct supervision/assist for financial management;Direct supervision/assist for medications management;Assistance with cooking/housework ?  ?Equipment Recommendations ? Rolling walker (2 wheels)  ?  ?Recommendations for Other Services   ? ? ?  ?Precautions / Restrictions Precautions ?Precautions: Fall ?Restrictions ?Weight Bearing Restrictions: No  ?  ? ?Mobility ? Bed Mobility ?Overal bed mobility: Modified Independent ?  ?  ?  ?  ?  ?  ?  ?  ? ?Transfers ?Overall transfer level: Needs assistance ?Equipment used: None ?Transfers: Sit to/from Stand ?Sit to Stand: Supervision ?  ?  ?  ?  ?  ?General transfer comment: supervision for safety, no physical assist required ?   ? ?Ambulation/Gait ?Ambulation/Gait assistance: Min guard ?Gait Distance (Feet): 250 Feet ?Assistive device: None ?Gait Pattern/deviations: Step-through pattern, Decreased dorsiflexion - right, Decreased dorsiflexion - left, Drifts right/left ?Gait velocity: decreased ?  ?  ?General Gait Details: Min guard for safety, dynamic instability, tendency to drift towards the right. Cues for obstacle negotiation. Decreased bilateral foot clearance ? ? ?Stairs ?Stairs: Yes ?Stairs assistance: Min guard ?Stair Management: One rail Right ?Number of Stairs: 12 ?General stair comments: Cues for step by step, making sure foot is fully flat on stair, min guard for safety ? ? ?Wheelchair Mobility ?  ? ?Modified Rankin (Stroke Patients Only) ?  ? ? ?  ?Balance Overall balance assessment: Mild deficits observed, not formally tested ?  ?  ?  ?  ?  ?  ?  ?  ?  ?  ?  ?  ?  ?  ?  ?  ?  ?  ?  ? ?  ?Cognition Arousal/Alertness: Awake/alert ?Behavior During Therapy: [facility] for tasks assessed/performed ?Overall Cognitive Status: Within Functional Limits for tasks assessed ?  ?  ?  ?  ?  ?  ?  ?  ?  ?  ?  ?  ?  ?  ?  ?  ?General Comments: WFL for basic mobility ?  ?  ? ?  ?Exercises   ? ?  ?General Comments General comments (skin integrity, edema, etc.): HR peak 109 bpm ?  ?  ? ?Pertinent Vitals/Pain Pain Assessment ?Pain Assessment: No/denies pain  ? ? ?Home Living   ?  ?  ?  ?  ?  ?  ?  ?  ?  ?   ?  ?Prior Function    ?  ?  ?   ? ?  PT Goals (current goals can now be found in the care plan section) Acute Rehab PT Goals ?Patient Stated Goal: to go home ?Potential to Achieve Goals: Good ?Progress towards PT goals: Progressing toward goals ? ?  ?Frequency ? ? ? Min 3X/week ? ? ? ?  ?PT Plan Current plan remains appropriate  ? ? ?Co-evaluation   ?  ?  ?  ?  ? ?  ?AM-PAC PT "6 Clicks" Mobility   ?Outcome Measure ? Help needed turning from your back to your side while in a flat bed without using bedrails?: None ?Help needed moving from lying on your  back to sitting on the side of a flat bed without using bedrails?: None ?Help needed moving to and from a bed to a chair (including a wheelchair)?: A Little ?Help needed standing up from a chair using your arms (e.g., wheelchair or bedside chair)?: A Little ?Help needed to walk in hospital room?: A Little ?Help needed climbing 3-5 steps with a railing? : A Little ?6 Click Score: 20 ? ?  ?End of Session Equipment Utilized During Treatment: Gait belt ?Activity Tolerance: Patient tolerated treatment well ?Patient left: in bed;with call bell/phone within reach ?Nurse Communication: Mobility status ?PT Visit Diagnosis: Unsteadiness on feet (R26.81);Muscle weakness (generalized) (M62.81) ?  ? ? ?Time: [phone] ?PT Time Calculation (min) (ACUTE ONLY): 13 min ? ?Charges:  $Gait Training: 8-22 mins          ?          ? ?Wyona Almas, PT, DPT ?Acute Rehabilitation Services ?Pager [phone] ?Office [phone] ? ? ? ?Carloine Margo Aye ?07/29/2021, 11:34 AM ? ?

## 2021-07-30 ENCOUNTER — Other Ambulatory Visit (HOSPITAL_COMMUNITY): Payer: Self-pay

## 2021-07-30 DIAGNOSIS — K209 Esophagitis, unspecified without bleeding: Secondary | ICD-10-CM

## 2021-07-30 DIAGNOSIS — K920 Hematemesis: Secondary | ICD-10-CM

## 2021-07-30 DIAGNOSIS — R531 Weakness: Secondary | ICD-10-CM

## 2021-07-30 LAB — CBC WITH DIFFERENTIAL/PLATELET
Abs Immature Granulocytes: 0.43 10*3/uL — ABNORMAL HIGH (ref 0.00–0.07)
Basophils Absolute: 0.1 10*3/uL (ref 0.0–0.1)
Basophils Relative: 1 %
Eosinophils Absolute: 0.2 10*3/uL (ref 0.0–0.5)
Eosinophils Relative: 2 %
HCT: 36 % (ref 36.0–46.0)
Hemoglobin: 13.5 g/dL (ref 12.0–15.0)
Immature Granulocytes: 4 %
Lymphocytes Relative: 20 %
Lymphs Abs: 2.4 10*3/uL (ref 0.7–4.0)
MCH: 36 pg — ABNORMAL HIGH (ref 26.0–34.0)
MCHC: 37.5 g/dL — ABNORMAL HIGH (ref 30.0–36.0)
MCV: 96 fL (ref 80.0–100.0)
Monocytes Absolute: 0.9 10*3/uL (ref 0.1–1.0)
Monocytes Relative: 8 %
Neutro Abs: 7.8 10*3/uL — ABNORMAL HIGH (ref 1.7–7.7)
Neutrophils Relative %: 65 %
Platelets: 189 10*3/uL (ref 150–400)
RBC: 3.75 MIL/uL — ABNORMAL LOW (ref 3.87–5.11)
RDW: 15.9 % — ABNORMAL HIGH (ref 11.5–15.5)
WBC: 11.7 10*3/uL — ABNORMAL HIGH (ref 4.0–10.5)
nRBC: 0.3 % — ABNORMAL HIGH (ref 0.0–0.2)

## 2021-07-30 LAB — SURGICAL PATHOLOGY

## 2021-07-30 LAB — BASIC METABOLIC PANEL
Anion gap: 8 (ref 5–15)
BUN: 5 mg/dL — ABNORMAL LOW (ref 6–20)
CO2: 24 mmol/L (ref 22–32)
Calcium: 8.8 mg/dL — ABNORMAL LOW (ref 8.9–10.3)
Chloride: 106 mmol/L (ref 98–111)
Creatinine, Ser: 0.66 mg/dL (ref 0.44–1.00)
GFR, Estimated: >60 mL/min (ref >60)
Glucose, Bld: 121 mg/dL — ABNORMAL HIGH (ref 70–99)
Potassium: 3.7 mmol/L (ref 3.5–5.1)
Sodium: 138 mmol/L (ref 135–145)

## 2021-07-30 LAB — MAGNESIUM: Magnesium: 1.9 mg/dL (ref 1.7–2.4)

## 2021-07-30 MED ORDER — MAGNESIUM OXIDE 400 MG PO TABS
400.0000 mg | ORAL_TABLET | Freq: Every day | ORAL | 0 refills | Status: AC
Start: 2021-07-30 — End: 2021-08-06
  Filled 2021-07-30: qty 7, 7d supply, fill #0

## 2021-07-30 MED ORDER — HYDROCERIN EX CREA
1.0000 | TOPICAL_CREAM | Freq: Three times a day (TID) | CUTANEOUS | 1 refills | Status: DC
Start: 2021-07-30 — End: 2023-12-16
  Filled 2021-07-30: qty 454, 38d supply, fill #0

## 2021-07-30 MED ORDER — THIAMINE HCL 100 MG PO TABS: 100.0000 mg | ORAL_TABLET | Freq: Every day | ORAL | Status: DC

## 2021-07-30 MED ORDER — AMLODIPINE BESYLATE 10 MG PO TABS
10.0000 mg | ORAL_TABLET | Freq: Every day | ORAL | 1 refills | Status: DC
  Filled 2021-07-30: qty 30, 30d supply, fill #0

## 2021-07-30 MED ORDER — THIAMINE HCL 100 MG PO TABS: 100.0000 mg | ORAL_TABLET | Freq: Every day | ORAL | 0 refills | Status: DC

## 2021-07-30 MED ORDER — PANTOPRAZOLE SODIUM 40 MG PO TBEC
40.0000 mg | DELAYED_RELEASE_TABLET | Freq: Every day | ORAL | 1 refills | Status: DC
  Filled 2021-07-30: qty 30, 30d supply, fill #0

## 2021-07-30 MED ORDER — CLOTRIMAZOLE 1 % VA CREA
1.0000 | TOPICAL_CREAM | Freq: Every day | VAGINAL | 0 refills | Status: AC
  Filled 2021-07-30: qty 45, 6d supply, fill #0

## 2021-07-30 MED ORDER — LISINOPRIL 40 MG PO TABS
40.0000 mg | ORAL_TABLET | Freq: Every day | ORAL | 1 refills | Status: DC
Start: 2021-07-31 — End: 2022-04-02
  Filled 2021-07-30: qty 30, 30d supply, fill #0

## 2021-07-30 MED ORDER — THIAMINE HCL 100 MG/ML IJ SOLN
100.0000 mg | Freq: Once | INTRAMUSCULAR | Status: AC
  Administered 2021-07-30: 100 mg via INTRAMUSCULAR
  Filled 2021-07-30: qty 2

## 2021-07-30 MED ORDER — LOPERAMIDE HCL 2 MG PO CAPS: 2.0000 mg | ORAL_CAPSULE | ORAL | Status: DC | PRN

## 2021-07-30 MED ORDER — HYDROXYZINE HCL 25 MG PO TABS: 25.0000 mg | ORAL_TABLET | Freq: Four times a day (QID) | ORAL | Status: DC | PRN

## 2021-07-30 MED ORDER — NICOTINE 21 MG/24HR TD PT24
21.0000 mg | MEDICATED_PATCH | Freq: Every day | TRANSDERMAL | 0 refills | Status: DC
  Filled 2021-07-30: qty 28, 28d supply, fill #0

## 2021-07-30 MED ORDER — ADULT MULTIVITAMIN W/MINERALS CH: 1.0000 | ORAL_TABLET | Freq: Every day | ORAL | 0 refills | Status: DC

## 2021-07-30 MED ORDER — FOLIC ACID 1 MG PO TABS: 1.0000 mg | ORAL_TABLET | Freq: Every day | ORAL | 1 refills | Status: DC

## 2021-07-30 MED ORDER — GABAPENTIN 100 MG PO CAPS
100.0000 mg | ORAL_CAPSULE | Freq: Three times a day (TID) | ORAL | 1 refills | Status: DC
  Filled 2021-07-30: qty 90, 30d supply, fill #0

## 2021-07-30 MED ORDER — HYDROXYZINE HCL 25 MG PO TABS
25.0000 mg | ORAL_TABLET | Freq: Three times a day (TID) | ORAL | 0 refills | Status: DC | PRN
Start: 2021-07-30 — End: 2022-04-02
  Filled 2021-07-30: qty 20, 7d supply, fill #0

## 2021-07-30 MED ORDER — ADULT MULTIVITAMIN W/MINERALS CH: 1.0000 | ORAL_TABLET | Freq: Every day | ORAL | Status: DC

## 2021-07-30 MED ORDER — ONDANSETRON 4 MG PO TBDP: 4.0000 mg | ORAL_TABLET | Freq: Four times a day (QID) | ORAL | Status: DC | PRN

## 2021-07-30 MED ORDER — CHLORDIAZEPOXIDE HCL 25 MG PO CAPS
25.0000 mg | ORAL_CAPSULE | Freq: Four times a day (QID) | ORAL | Status: DC | PRN
  Administered 2021-07-30: 25 mg via ORAL
  Filled 2021-07-30: qty 1

## 2021-07-30 NOTE — Progress Notes (Signed)
Mobility Specialist Progress Note: ? ? 07/30/21 1321  ?Mobility  ?Activity Ambulated with assistance in hallway  ?Level of Assistance Standby assist, set-up cues, supervision of patient - no hands on  ?Assistive Device None  ?Distance Ambulated (ft) 200 ft  ?Activity Response Tolerated well  ?$Mobility charge 1 Mobility  ? ?Pt received in bed willing to participate in mobility. Complaints of L side pain. Left EOB with call bell in reach and all needs met.   ? ?Sharon Hunt ?Mobility Specialist ?Primary Phone [phone] ? ?

## 2021-07-30 NOTE — Discharge Summary (Signed)
Physician Discharge Summary  ?Sharon Hunt IWP:[id number] DOB: Jun 07, 1970 DOA: 07/25/2021 ? ?PCP: Argentina Donovan, PA-C ? ?Admit date: 07/25/2021 ?Discharge date: 07/30/2021 ? ?Time spent: 60 minutes ? ?Recommendations for Outpatient Follow-up:  ?Follow-up with Argentina Donovan, PA-C on 08/22/2021 at 10:30 AM.  On follow-up patient's blood pressure will need to be reassessed and further management at that time.  Patient also need a basic metabolic profile magnesium level done to follow-up on electrolytes and renal function. ?Follow-up with Dr. Loletha Carrow III, gastroenterology in 2 weeks. ? ? ?Discharge Diagnoses:  ?Principal Problem: ?  GI bleeding ?Active Problems: ?  Alcohol abuse ?  Hypokalemia ?  Hypomagnesemia ?  GERD (gastroesophageal reflux disease) ?  HTN (hypertension) ?  Esophagitis ?  Hematemesis ? ? ?Discharge Condition: Stable and improved. ? ?Diet recommendation: Regular diet ? ?Filed Weights  ? 07/25/21 2351  ?Weight: 79.4 kg  ? ? ?History of present illness:  ?HPI Dr. Evangeline Hunt ?: Sharon Hunt is a 51 y.o. female with medical history significant of alcohol abuse, gastroesophageal reflux disease, hypertension, tobacco abuse, hyperlipidemia, abnormal uterine bleeding with leiomyoma of the uterus, and QT prolongation who presents to the emergency department with complaints of vomiting blood 3 days ago.  In triage she showed the nurse some blood-tinged emesis but states that she was experiencing large volumes of blood at home.  She drinks 1/5 of alcohol a day and her last drink was at 3 PM yesterday.  She is unable to keep down fluids.  She complains of abdominal pain and chills.  On my examination she is alert and awake experiencing chills and tremors.  Her skin feels warm and her temperature is slightly elevated axillary.  She reports drinking daily for the past 5 to 6 years.  She had one short period of time where she was sober for about 4 to 5 months a couple of years ago.  She often goes into  withdrawal.  She will go periods of time without eating or drinking normally just consuming alcohol.  She has a history of gastritis and reflux related to a hiatal hernia and perhaps contributed to by the alcohol use.  In 2012 she had an EGD.  She had no varices at that time but had not been a regular drinker back then.  She is compliant with her medications and does not take any blood thinners.  Presents with concerns about vomiting blood. ?  ?ED Course: Vital signs were normal noted to be elevated with blood pressure elevated respirations elevated and intermittent tachycardia in the emergency department.  Potassium was noted to be low at 2.7 magnesium low at 1.4, blood cell count elevated at 15, Lifteez elevated with mild transaminitis.  EKG showed sinus tachycardia and right atrial enlargement.Chest x-ray was unremarkable, CT scan of the abdomen and pelvis showed no bowel obstruction or free air, mild bladder wall thickening with infection or inflammatory cystitis, marked hepatic steatosis.  Referred to me for further evaluation of possible GI bleeding and prevention of possible alcohol withdrawal. ? ?Hospital Course:  ?#1 vomiting induced upper GI bleed/esophagitis ? -Patient presented with concerns for hematemesis/coffee-ground emesis. ?-Patient with history of significant/heavy alcohol use. ?-Patient with no prior history of varices. ?-Patient admitted, placed on IV PPI twice daily and made NPO on admission.. ?-GI consulted and patient underwent upper endoscopy 07/27/2021 which showed LA grade A reflux esophagitis with no active bleeding noted, widely patent Schatzki's ring, congested and erythematous mucosa of the gastric fundus and body.  Biopsies taken and were pending at time of discharge. ?-Patient improved clinically had no further episodes of hematemesis or coffee-ground emesis.   ?-Diet was advanced which patient tolerated.   ?-Patient maintained on PPI daily which patient will be discharged home on.    ?-Alcohol cessation stressed to patient.   ?-Outpatient follow-up with PCP and GI.   ? ?2.  Hypomagnesemia/hypokalemia ?-Likely secondary to GI losses. ?-Repleted.   ?-Outpatient follow-up.   ? ?3.  GERD ?-Patient maintained on PPI. ?-Outpatient follow-up ? ?4.  Alcohol abuse ?-Alcohol cessation stressed to patient. ?-See problem #6.  ? ?5.  Hypertension ?-Antihypertensive medications initially held. ?-Patient stated used to be on antihypertensive medications however doses subsequently discontinued when her blood pressure got better. ?-Patient subsequently started on Norvasc and dose uptitrated to 10 mg daily as well as lisinopril and dose uptitrated to 40 mg daily.   ?-Patient was discharged home on Norvasc and lisinopril.   ?-Outpatient follow-up with PCP.  ? ?6.  History of alcohol abuse ?-Patient with significant heavy alcohol use and noted per admitting physician to drink daily for the past 5 to 6 years. ?-Patient was on the Ativan withdrawal protocol. ?-Patient subsequently placed on the Librium detox protocol, folic acid, thiamine, multivitamin with clinical improvement.   ?-Patient completed a Librium detox protocol.   ?-Patient was discharged home on hydroxyzine as needed.   ?-Alcohol cessation stressed to patient.   ?-TOC consulted and outpatient resources provided to the patient.  ?  ? ?Procedures: ?Gastroenterology: Dr.Danis III 07/26/2021 ?  ? ?Consultations: ?None ? ?Discharge Exam: ?Vitals:  ? 07/30/21 0522 07/30/21 0749  ?BP: 132/80 139/81  ?Pulse: 87 83  ?Resp: 17 16  ?Temp:  99 ?F (37.2 ?C)  ?SpO2: 95% 93%  ? ? ?General: NAD ?Cardiovascular: Regular rate rhythm no murmurs rubs or gallops.  No JVD.  No lower extremity edema ?Respiratory: CTAB.  No wheezes, no crackles, no rhonchi. ? ?Discharge Instructions ? ? ?Discharge Instructions   ? ? Ambulatory referral to Physical Therapy   Complete by: As directed ?  ? Ambulatory referral to Physical Therapy   Complete by: As directed ?  ? Iontophoresis - 4  mg/ml of dexamethasone: No  ? T.E.N.S. Unit Evaluation and Dispense as Indicated: No  ? Diet general   Complete by: As directed ?  ? Increase activity slowly   Complete by: As directed ?  ? ?  ? ?Allergies as of 07/30/2021   ? ?   Reactions  ? Coconut Flavor Itching  ? ?  ? ?  ?Medication List  ?  ? ?STOP taking these medications   ? ?B-complex with vitamin C tablet ?  ?magnesium oxide 400 (241.3 Mg) MG tablet ?Commonly known as: MAG-OX ?Replaced by: magnesium oxide 400 MG tablet ?  ?omeprazole 20 MG tablet ?Commonly known as: PRILOSEC OTC ?  ?omeprazole 40 MG capsule ?Commonly known as: PRILOSEC ?  ? ?  ? ?TAKE these medications   ? ?aluminum-magnesium hydroxide-simethicone 200-200-20 MG/5ML Susp ?Commonly known as: MAALOX ?Take 30 mLs by mouth in the morning and at bedtime. ?  ?amLODipine 10 MG tablet ?Commonly known as: NORVASC ?Take 1 tablet (10 mg total) by mouth daily. ?Start taking on: Jul 31, 2021 ?What changed: how much to take ?  ?clotrimazole 1 % vaginal cream ?Commonly known as: GYNE-LOTRIMIN ?Place 1 applicatorful vaginally at bedtime for 6 days. ?  ?diphenhydrAMINE 25 MG tablet ?Commonly known as: BENADRYL ?Take 50 mg by mouth every 6 (six) hours  as needed for allergies. ?  ?folic acid 1 MG tablet ?Commonly known as: FOLVITE ?Take 1 tablet (1 mg total) by mouth daily. ?  ?gabapentin 100 MG capsule ?Commonly known as: NEURONTIN ?Take 1 capsule (100 mg total) by mouth 3 (three) times daily. ?  ?hydrocerin Crea ?Apply 1 application. topically 3 (three) times daily. ?  ?hydrOXYzine 25 MG tablet ?Commonly known as: ATARAX ?Take 1 tablet (25 mg total) by mouth 3 (three) times daily as needed for anxiety. ?  ?lisinopril 40 MG tablet ?Commonly known as: ZESTRIL ?Take 1 tablet (40 mg total) by mouth daily. ?Start taking on: Jul 31, 2021 ?  ?magnesium oxide 400 MG tablet ?Commonly known as: MAG-OX ?Take 1 tablet (400 mg total) by mouth daily for 7 days. ?Replaces: magnesium oxide 400 (241.3 Mg) MG tablet ?   ?multivitamin with minerals Tabs tablet ?Take 1 tablet by mouth daily. ?  ?nicotine 21 mg/24hr patch ?Commonly known as: NICODERM CQ - dosed in mg/24 hours ?Place 1 patch (21 mg total) onto the skin daily. ?Start ta

## 2021-07-30 NOTE — Progress Notes (Signed)
Patient discharged to home with instructions and take home medications that was delivered by our [facility]. ?

## 2021-08-01 ENCOUNTER — Encounter: Payer: Self-pay | Admitting: Gastroenterology

## 2021-08-15 ENCOUNTER — Ambulatory Visit: Payer: Self-pay | Attending: Internal Medicine | Admitting: Physical Therapy

## 2021-08-21 NOTE — Progress Notes (Deleted)
Patient ID: Sharon Hunt, female   DOB: [birth year], 51 y.o.   MRN: [id number]   After hospitalization 5/11-5/16/2023  From discharge summary: Recommendations for Outpatient Follow-up:  Follow-up with Argentina Donovan, PA-C on 08/22/2021 at 10:30 AM.  On follow-up patient's blood pressure will need to be reassessed and further management at that time.  Patient also need a basic metabolic profile magnesium level done to follow-up on electrolytes and renal function. Follow-up with Dr. Loletha Carrow III, gastroenterology in 2 weeks.     Discharge Diagnoses:  Principal Problem:   GI bleeding Active Problems:   Alcohol abuse   Hypokalemia   Hypomagnesemia   GERD (gastroesophageal reflux disease)   HTN (hypertension)   Esophagitis   Hematemesis History of present illness:  HPI Dr. Evangeline Gula : Sharon Hunt is a 51 y.o. female with medical history significant of alcohol abuse, gastroesophageal reflux disease, hypertension, tobacco abuse, hyperlipidemia, abnormal uterine bleeding with leiomyoma of the uterus, and QT prolongation who presents to the emergency department with complaints of vomiting blood 3 days ago.  In triage she showed the nurse some blood-tinged emesis but states that she was experiencing large volumes of blood at home.  She drinks 1/5 of alcohol a day and her last drink was at 3 PM yesterday.  She is unable to keep down fluids.  She complains of abdominal pain and chills.  On my examination she is alert and awake experiencing chills and tremors.  Her skin feels warm and her temperature is slightly elevated axillary.  She reports drinking daily for the past 5 to 6 years.  She had one short period of time where she was sober for about 4 to 5 months a couple of years ago.  She often goes into withdrawal.  She will go periods of time without eating or drinking normally just consuming alcohol.  She has a history of gastritis and reflux related to a hiatal hernia and perhaps contributed to by  the alcohol use.  In 2012 she had an EGD.  She had no varices at that time but had not been a regular drinker back then.  She is compliant with her medications and does not take any blood thinners.  Presents with concerns about vomiting blood.   ED Course: Vital signs were normal noted to be elevated with blood pressure elevated respirations elevated and intermittent tachycardia in the emergency department.  Potassium was noted to be low at 2.7 magnesium low at 1.4, blood cell count elevated at 15, Lifteez elevated with mild transaminitis.  EKG showed sinus tachycardia and right atrial enlargement.Chest x-ray was unremarkable, CT scan of the abdomen and pelvis showed no bowel obstruction or free air, mild bladder wall thickening with infection or inflammatory cystitis, marked hepatic steatosis.  Referred to me for further evaluation of possible GI bleeding and prevention of possible alcohol withdrawal.   Hospital Course:  #1 vomiting induced upper GI bleed/esophagitis  -Patient presented with concerns for hematemesis/coffee-ground emesis. -Patient with history of significant/heavy alcohol use. -Patient with no prior history of varices. -Patient admitted, placed on IV PPI twice daily and made NPO on admission.. -GI consulted and patient underwent upper endoscopy 07/27/2021 which showed LA grade A reflux esophagitis with no active bleeding noted, widely patent Schatzki's ring, congested and erythematous mucosa of the gastric fundus and body.  Biopsies taken and were pending at time of discharge. -Patient improved clinically had no further episodes of hematemesis or coffee-ground emesis.   -Diet was advanced which patient tolerated.   -  Patient maintained on PPI daily which patient will be discharged home on.   -Alcohol cessation stressed to patient.   -Outpatient follow-up with PCP and GI.    2.  Hypomagnesemia/hypokalemia -Likely secondary to GI losses. -Repleted.   -Outpatient follow-up.    3.   GERD -Patient maintained on PPI. -Outpatient follow-up  4.  Alcohol abuse -Alcohol cessation stressed to patient. -See problem #6.   5.  Hypertension -Antihypertensive medications initially held. -Patient stated used to be on antihypertensive medications however doses subsequently discontinued when her blood pressure got better. -Patient subsequently started on Norvasc and dose uptitrated to 10 mg daily as well as lisinopril and dose uptitrated to 40 mg daily.   -Patient was discharged home on Norvasc and lisinopril.   -Outpatient follow-up with PCP.   6.  History of alcohol abuse -Patient with significant heavy alcohol use and noted per admitting physician to drink daily for the past 5 to 6 years. -Patient was on the Ativan withdrawal protocol. -Patient subsequently placed on the Librium detox protocol, folic acid, thiamine, multivitamin with clinical improvement.   -Patient completed a Librium detox protocol.   -Patient was discharged home on hydroxyzine as needed.   -Alcohol cessation stressed to patient.   -TOC consulted and outpatient resources provided to the patient.

## 2021-08-22 ENCOUNTER — Inpatient Hospital Stay: Payer: Self-pay | Admitting: Physician Assistant

## 2021-12-20 ENCOUNTER — Ambulatory Visit (INDEPENDENT_AMBULATORY_CARE_PROVIDER_SITE_OTHER): Payer: Self-pay | Admitting: Nurse Practitioner

## 2021-12-20 ENCOUNTER — Other Ambulatory Visit (HOSPITAL_COMMUNITY): Payer: Self-pay

## 2021-12-20 ENCOUNTER — Encounter: Payer: Self-pay | Admitting: Nurse Practitioner

## 2021-12-20 VITALS — BP 160/98 | Ht 65.0 in

## 2021-12-20 DIAGNOSIS — I10 Essential (primary) hypertension: Secondary | ICD-10-CM

## 2021-12-20 DIAGNOSIS — R7989 Other specified abnormal findings of blood chemistry: Secondary | ICD-10-CM

## 2021-12-20 DIAGNOSIS — E1140 Type 2 diabetes mellitus with diabetic neuropathy, unspecified: Secondary | ICD-10-CM

## 2021-12-20 DIAGNOSIS — G629 Polyneuropathy, unspecified: Secondary | ICD-10-CM

## 2021-12-20 DIAGNOSIS — Z720 Tobacco use: Secondary | ICD-10-CM

## 2021-12-20 DIAGNOSIS — F1010 Alcohol abuse, uncomplicated: Secondary | ICD-10-CM

## 2021-12-20 DIAGNOSIS — E782 Mixed hyperlipidemia: Secondary | ICD-10-CM

## 2021-12-20 DIAGNOSIS — R0602 Shortness of breath: Secondary | ICD-10-CM

## 2021-12-20 HISTORY — DX: Type 2 diabetes mellitus with diabetic neuropathy, unspecified: E11.40

## 2021-12-20 LAB — POCT URINALYSIS DIP (CLINITEK)
Bilirubin, UA: NEGATIVE
Blood, UA: NEGATIVE
Glucose, UA: 500 mg/dL — AB
Leukocytes, UA: NEGATIVE
Nitrite, UA: NEGATIVE
POC PROTEIN,UA: NEGATIVE
Spec Grav, UA: <=1.005 — AB (ref 1.010–1.025)
Urobilinogen, UA: 1 E.U./dL
pH, UA: 5.5 (ref 5.0–8.0)

## 2021-12-20 LAB — POCT GLYCOSYLATED HEMOGLOBIN (HGB A1C)
HbA1c POC (<> result, manual entry): 12.3 % (ref 4.0–5.6)
HbA1c, POC (controlled diabetic range): 12.3 % — AB (ref 0.0–7.0)
HbA1c, POC (prediabetic range): 12.3 % — AB (ref 5.7–6.4)
Hemoglobin A1C: 12.3 % — AB (ref 4.0–5.6)

## 2021-12-20 MED ORDER — AMLODIPINE BESYLATE 5 MG PO TABS
5.0000 mg | ORAL_TABLET | Freq: Every day | ORAL | 0 refills | Status: DC
  Filled 2021-12-23: qty 30, 30d supply, fill #0

## 2021-12-20 MED ORDER — AMLODIPINE BESYLATE 5 MG PO TABS
5.0000 mg | ORAL_TABLET | Freq: Every day | ORAL | 0 refills | Status: DC
  Filled 2021-12-20: qty 30, 30d supply, fill #0

## 2021-12-20 MED ORDER — METFORMIN HCL 500 MG PO TABS
500.0000 mg | ORAL_TABLET | Freq: Two times a day (BID) | ORAL | 3 refills | Status: DC
  Filled 2021-12-20: qty 60, 30d supply, fill #0

## 2021-12-20 MED ORDER — GABAPENTIN 300 MG PO CAPS
300.0000 mg | ORAL_CAPSULE | Freq: Three times a day (TID) | ORAL | 3 refills | Status: DC
  Filled 2021-12-20: qty 90, 30d supply, fill #0

## 2021-12-20 NOTE — Patient Instructions (Addendum)
Please start taking gabapentin '300mg'$  three times daily for your neuropathy.  Start taking amlodipine '5mg'$  daily  for hypertension. Blood pressure goal is less than 140/90  Please continue to work on smoking cessation and alcohol cessation. Take folic acid '1mg'$  daily   Start Metformin '500mg'$  two times daily for diabetes   .Around 3 times per week, check your blood pressure 2 times per day. once in the morning and once in the evening. The readings should be at least one minute apart. Write down these values and bring them to your next nurse visit/appointment.  When you check your BP, make sure you have been doing something calm/relaxing 5 minutes prior to checking. Both feet should be flat on the floor and you should be sitting. Use your left arm and make sure it is in a relaxed position (on a table), and that the cuff is at the approximate level/height of your heart.

## 2021-12-20 NOTE — Assessment & Plan Note (Addendum)
BP Readings from Last 3 Encounters:  12/20/21 (!) 160/98  07/30/21 136/83  01/07/19 (!) 152/96  Uncontrolled hypertension Currently not taking any medications for her condition Start amlodipine 5 mg daily blood pressure goal is less than 130/80.  Monitor blood pressure at home keep a log and bring to next follow-up in 4 weeks. DASH diet advised engage in regular daily exercises at least 150 minutes weekly as tolerated Check CMP We will add ACE, ARB at next visit for kidney protection. Need to get hypertension under control discussed with patient and her daughter Referral sent pharmacist to help promote medication adherence

## 2021-12-20 NOTE — Assessment & Plan Note (Signed)
Smoking cessation education completed. Will do a trial of alubetrol inhaler at her next visit if symptoms doe not improved .

## 2021-12-20 NOTE — Progress Notes (Signed)
New Patient Office Visit  Subjective:  Patient ID: Sharon Hunt, female    DOB: 07-Feb-1971  Age: 51 y.o. MRN: [id number]  CC:  Chief Complaint  Patient presents with   Establish Care    Pt is here to establish care. Pt states she has neuropathy and want it checked also hands give out and balance issues. Pt is requesting labs and diflucan    HPI Sharon Hunt is a 51 y.o. female with past medical history of hypertension, hyperlipidemia, neuropathy, alcohol abuse  tobacco abuse, presents for establishing care for her chronic medical conditions. Has no recent PCP.  Patient is accompanied to today's visit by her daughter who assisted with providing history.  Hypertension.  Patient is currently not taking any medications, she was seen in the emergency room about 5 months ago, she cannot tell when last she took her blood pressure medications.  Patient denies headache, syncope, edema.    Patient complains of chronic  Neuropathy pain for years.  She stated that her neuropathic pain has worsened since the past 5 months .she has chronic numbness and tingling in both hands and bilateral lower extremities.  She was on gabapentin 100 mg in the past but it did not help her condition.  She is currently using a cream that she got over-the-counter.  Patient denies seizures, states that she has unsteady gait .   Tobacco abuse.  Started smoking at age 32.  She was previously smoking 1 pack daily but she has cut that have a pack of cigarettes daily.  Has shortness of breath wheezing productive cough.   Drinks 3 cans of beer daily about 2 L of soda daily, her  daughter reports that she does not drink water, has abdominal pain does have nausea and vomiting at home.  Patient reports increased urinary frequency her daughter stated that she has noticed that the patient has been applying Monistat.  Patient denies vagina itching, vaginal discharge, chills,  She was in the hospital about 5 months ago for GI  bleeding, alcohol abuse, hypomagnesemia, esophagitis, hematemesis.  Has history of hiatal hernia which was thought to be due to her alcohol abuse.  Reflux esophagitis with no active bleeding was noted while in the hospital.  She was referred to GI but patient did not follow-up with GI per her daughter  Due for PAP smear, colonoscopy, mammogram, TDAP vaccine, shingle vaccine. Need for the screening testes discussed will apply for scholarships to assist with cost of colonoscopy and mammogram at next visit    Past Medical History:  Diagnosis Date   H/O hiatal hernia    Hypertension    Type 2 diabetes mellitus with chronic painful diabetic neuropathy ([place]) 12/20/2021    Past Surgical History:  Procedure Laterality Date   BIOPSY  07/27/2021   Procedure: BIOPSY;  Surgeon: Doran Stabler, MD;  Location: [facility];  Service: Gastroenterology;;   ESOPHAGOGASTRODUODENOSCOPY (EGD) WITH PROPOFOL N/A 07/27/2021   Procedure: ESOPHAGOGASTRODUODENOSCOPY (EGD) WITH PROPOFOL;  Surgeon: Doran Stabler, MD;  Location: [facility];  Service: Gastroenterology;  Laterality: N/A;   KNEE SURGERY     TONSILLECTOMY     TUBAL LIGATION      Family History  Problem Relation Age of Onset   Hypertension Father    Diabetes Mellitus II Father    Glaucoma Father     Social History   Socioeconomic History   Marital status: Single    Spouse name: Not on file   Number  of children: 3   Years of education: Not on file   Highest education level: Not on file  Occupational History   Not on file  Tobacco Use   Smoking status: Every Day    Packs/day: 1.00    Types: Cigarettes   Smokeless tobacco: Not on file  Substance and Sexual Activity   Alcohol use: No    Alcohol/week: 4.0 standard drinks of alcohol    Types: 4 Cans of beer per week    Comment: y   Drug use: No   Sexual activity: Not Currently  Other Topics Concern   Not on file  Social History Narrative   Lives with her son.    Social  Determinants of Health   Financial Resource Strain: Not on file  Food Insecurity: Not on file  Transportation Needs: Not on file  Physical Activity: Not on file  Stress: Not on file  Social Connections: Not on file  Intimate Partner Violence: Not on file    ROS Review of Systems  Constitutional:  Positive for appetite change. Negative for chills, diaphoresis and fever.  Eyes:  Negative for pain, discharge and itching.  Respiratory:  Positive for shortness of breath and wheezing. Negative for chest tightness and stridor.   Cardiovascular:  Negative for chest pain, palpitations and leg swelling.  Gastrointestinal:  Negative for abdominal distention, anal bleeding, constipation and diarrhea.  Genitourinary:  Positive for frequency. Negative for genital sores, hematuria, pelvic pain, vaginal bleeding and vaginal discharge.  Skin:  Negative for color change, pallor and rash.  Neurological:  Positive for numbness. Negative for seizures, facial asymmetry, speech difficulty and headaches.  Psychiatric/Behavioral:  Negative for agitation, behavioral problems, confusion, decreased concentration, dysphoric mood, hallucinations and suicidal ideas.     Objective:   Today's Vitals: BP (!) 160/98   Ht '5\' 5"'  (1.651 m)   BMI 29.12 kg/m   Physical Exam Constitutional:      General: She is not in acute distress.    Appearance: She is not ill-appearing, toxic-appearing or diaphoretic.  Cardiovascular:     Rate and Rhythm: Normal rate and regular rhythm.     Pulses: Normal pulses.     Heart sounds: Normal heart sounds. No murmur heard.    No friction rub. No gallop.  Pulmonary:     Effort: Pulmonary effort is normal. No respiratory distress.     Breath sounds: Normal breath sounds. No stridor. No wheezing, rhonchi or rales.  Chest:     Chest wall: No tenderness.  Abdominal:     Palpations: Abdomen is soft.     Tenderness: There is no abdominal tenderness.     Hernia: No hernia is present.   Musculoskeletal:        General: Tenderness present. No swelling, deformity or signs of injury.     Comments: Has palpable pedal pulses, radial pulses palpable Skin warm and dry.  Reports loss no sensation on examination of bilateral feet with a monofilament. Tenderness on palpation of right foot   Skin:    General: Skin is dry.     Capillary Refill: Capillary refill takes less than 2 seconds.     Coloration: Skin is not jaundiced or pale.     Findings: No bruising or lesion.     Comments: Bilateral foot appears dry , no swelling, redness, drainage noted.  Neurological:     Mental Status: She is alert and oriented to person, place, and time.     Cranial Nerves: No cranial nerve  deficit.     Sensory: No sensory deficit.     Motor: No weakness.     Coordination: Coordination normal.  Psychiatric:        Behavior: Behavior normal.        Thought Content: Thought content normal.        Judgment: Judgment normal.     Assessment & Plan:   Problem List Items Addressed This Visit       Cardiovascular and Mediastinum   HTN (hypertension)    BP Readings from Last 3 Encounters:  12/20/21 (!) 160/98  07/30/21 136/83  01/07/19 (!) 152/96  Uncontrolled hypertension Currently not taking any medications for her condition Start amlodipine 5 mg daily blood pressure goal is less than 130/80.  Monitor blood pressure at home keep a log and bring to next follow-up in 4 weeks. DASH diet advised engage in regular daily exercises at least 150 minutes weekly as tolerated Check CMP We will add ACE, ARB at next visit for kidney protection. Need to get hypertension under control discussed with patient and her daughter Referral sent pharmacist to help promote medication adherence      Relevant Medications   amLODipine (NORVASC) 5 MG tablet   Other Relevant Orders   AMB Referral to Pharmacy Medication Management     Endocrine   Type 2 diabetes mellitus with chronic painful diabetic neuropathy  (HCC)    Lab Results  Component Value Date   HGBA1C 12.3 (A) 12/20/2021   HGBA1C 12.3 12/20/2021   HGBA1C 12.3 (A) 12/20/2021   HGBA1C 12.3 (A) 12/20/2021  No prior diagnosis of type 2 diabetes Start metformin 500 mg twice daily If medication is well-tolerated will increase to 1000 mg twice daily at next follow-up. We will plan to start patient on lisinopril at her next follow-up for kidney protection. Currently not on a statin will obtain fasting lipid panel at her next visit.  Need to avoid sugar sweets soda discussed. Need to get diabetes under control also discussed with patient and her daughter      Relevant Medications   gabapentin (NEURONTIN) 300 MG capsule   metFORMIN (GLUCOPHAGE) 500 MG tablet     Nervous and Auditory   Neuropathy - Primary    Chronic medical condition now getting worse Drinks 3 cans of beer daily, has uncontrolled diabetes which is most likely contributing to this. Rx gabapentin 300 mg 3 times daily Referral sent to neurologist Follow-up in 1 month. Patient will benefit from use of  diabetic shoes will discuss this with the patient at her next visit      Relevant Medications   gabapentin (NEURONTIN) 300 MG capsule   Other Relevant Orders   B12 and Folate Panel   CMP14+EGFR   Ambulatory referral to Neurology   POCT glycosylated hemoglobin (Hb A1C) (Completed)   POCT URINALYSIS DIP (CLINITEK) (Completed)     Other   Alcohol abuse    Chronic condition Need to quit drinking alcohol discussed with patient and her daughter      Tobacco abuse    Smokes about 0.5 pack/day  Asked about quitting: confirms that he/she currently smokes cigarettes Advise to quit smoking: Educated about QUITTING to reduce the risk of cancer, cardio and cerebrovascular disease. Assess willingness: Unwilling to quit at this time,Assist with counseling and pharmacotherapy: Counseled for 5 minutes and literature provided. Arrange for follow up: follow up in 1 months and  continue to offer help.      Low vitamin B12 level  Check folate and B12 level Currently not on B12 supplements      Hyperlipidemia    Lab Results  Component Value Date   CHOL 219 (H) 04/04/2015   HDL 39 (L) 04/04/2015   LDLCALC 149 (H) 04/04/2015   TRIG 154 (H) 04/04/2015   CHOLHDL 5.6 (H) 04/04/2015  Currently not on any statin We will check lipid panel at next follow-up started on a statin LDL goal is less than 70      Relevant Medications   amLODipine (NORVASC) 5 MG tablet    Outpatient Encounter Medications as of 12/20/2021  Medication Sig   amLODipine (NORVASC) 5 MG tablet Take 1 tablet (5 mg total) by mouth daily.   gabapentin (NEURONTIN) 300 MG capsule Take 1 capsule (300 mg total) by mouth 3 (three) times daily.   hydrOXYzine (ATARAX) 25 MG tablet Take 1 tablet (25 mg total) by mouth 3 (three) times daily as needed for anxiety.   metFORMIN (GLUCOPHAGE) 500 MG tablet Take 1 tablet (500 mg total) by mouth 2 (two) times daily with a meal.   aluminum-magnesium hydroxide-simethicone (MAALOX) 200-200-20 MG/5ML SUSP Take 30 mLs by mouth in the morning and at bedtime. (Patient not taking: Reported on 12/20/2021)   diphenhydrAMINE (BENADRYL) 25 MG tablet Take 50 mg by mouth every 6 (six) hours as needed for allergies. (Patient not taking: Reported on 17/0/0174)   folic acid (FOLVITE) 1 MG tablet Take 1 tablet (1 mg total) by mouth daily. (Patient not taking: Reported on 12/20/2021)   hydrocerin (EUCERIN) CREA Apply 1 application. topically 3 (three) times daily. (Patient not taking: Reported on 12/20/2021)   lisinopril (ZESTRIL) 40 MG tablet Take 1 tablet (40 mg total) by mouth daily. (Patient not taking: Reported on 12/20/2021)   Multiple Vitamin (MULTIVITAMIN WITH MINERALS) TABS tablet Take 1 tablet by mouth daily. (Patient not taking: Reported on 12/20/2021)   nicotine (NICODERM CQ - DOSED IN MG/24 HOURS) 21 mg/24hr patch Place 1 patch (21 mg total) onto the skin daily. (Patient  not taking: Reported on 12/20/2021)   pantoprazole (PROTONIX) 40 MG tablet Take 1 tablet (40 mg total) by mouth daily before breakfast. (Patient not taking: Reported on 12/20/2021)   thiamine 100 MG tablet Take 1 tablet (100 mg total) by mouth daily. (Patient not taking: Reported on 12/20/2021)   [DISCONTINUED] amLODipine (NORVASC) 10 MG tablet Take 1 tablet (10 mg total) by mouth daily. (Patient not taking: Reported on 12/20/2021)   [DISCONTINUED] gabapentin (NEURONTIN) 100 MG capsule Take 1 capsule (100 mg total) by mouth 3 (three) times daily. (Patient not taking: Reported on 12/20/2021)   No facility-administered encounter medications on file as of 12/20/2021.    Follow-up: Return in about 4 weeks (around 01/17/2022) for F/U for HTN , Neuropathy.   Renee Rival, FNP

## 2021-12-20 NOTE — Assessment & Plan Note (Signed)
Lab Results  Component Value Date   HGBA1C 12.3 (A) 12/20/2021   HGBA1C 12.3 12/20/2021   HGBA1C 12.3 (A) 12/20/2021   HGBA1C 12.3 (A) 12/20/2021  No prior diagnosis of type 2 diabetes Start metformin 500 mg twice daily If medication is well-tolerated will increase to 1000 mg twice daily at next follow-up. We will plan to start patient on lisinopril at her next follow-up for kidney protection. Currently not on a statin will obtain fasting lipid panel at her next visit.  Need to avoid sugar sweets soda discussed. Need to get diabetes under control also discussed with patient and her daughter

## 2021-12-20 NOTE — Assessment & Plan Note (Signed)
Lab Results  Component Value Date   CHOL 219 (H) 04/04/2015   HDL 39 (L) 04/04/2015   LDLCALC 149 (H) 04/04/2015   TRIG 154 (H) 04/04/2015   CHOLHDL 5.6 (H) 04/04/2015  Currently not on any statin We will check lipid panel at next follow-up started on a statin LDL goal is less than 70

## 2021-12-20 NOTE — Assessment & Plan Note (Addendum)
Chronic medical condition now getting worse Drinks 3 cans of beer daily, has uncontrolled diabetes which is most likely contributing to this. Rx gabapentin 300 mg 3 times daily Referral sent to neurologist Follow-up in 1 month. Patient will benefit from use of  diabetic shoes will discuss this with the patient at her next visit

## 2021-12-20 NOTE — Assessment & Plan Note (Signed)
Chronic condition Need to quit drinking alcohol discussed with patient and her daughter

## 2021-12-20 NOTE — Assessment & Plan Note (Signed)
Smokes about 0.5 pack/day  Asked about quitting: confirms that he/she currently smokes cigarettes Advise to quit smoking: Educated about QUITTING to reduce the risk of cancer, cardio and cerebrovascular disease. Assess willingness: Unwilling to quit at this time,Assist with counseling and pharmacotherapy: Counseled for 5 minutes and literature provided. Arrange for follow up: follow up in 1 months and continue to offer help.

## 2021-12-20 NOTE — Assessment & Plan Note (Signed)
Check folate and B12 level Currently not on B12 supplements

## 2021-12-22 LAB — CMP14+EGFR
ALT: 83 IU/L — ABNORMAL HIGH (ref 0–32)
AST: 172 IU/L — ABNORMAL HIGH (ref 0–40)
Albumin/Globulin Ratio: 0.7 — ABNORMAL LOW (ref 1.2–2.2)
Albumin: 3.2 g/dL — ABNORMAL LOW (ref 3.8–4.9)
Alkaline Phosphatase: 265 IU/L — ABNORMAL HIGH (ref 44–121)
BUN/Creatinine Ratio: 7 — ABNORMAL LOW (ref 9–23)
BUN: 6 mg/dL (ref 6–24)
Bilirubin Total: 1.8 mg/dL — ABNORMAL HIGH (ref 0.0–1.2)
CO2: 21 mmol/L (ref 20–29)
Calcium: 9.5 mg/dL (ref 8.7–10.2)
Chloride: 82 mmol/L — ABNORMAL LOW (ref 96–106)
Creatinine, Ser: 0.83 mg/dL (ref 0.57–1.00)
Globulin, Total: 4.3 g/dL (ref 1.5–4.5)
Glucose: 691 mg/dL (ref 70–99)
Potassium: 4.1 mmol/L (ref 3.5–5.2)
Sodium: 126 mmol/L — ABNORMAL LOW (ref 134–144)
Total Protein: 7.5 g/dL (ref 6.0–8.5)
eGFR: 85 mL/min/{1.73_m2} (ref >59)

## 2021-12-22 LAB — B12 AND FOLATE PANEL
Folate: 7.8 ng/mL (ref >3.0)
Vitamin B-12: 1629 pg/mL — ABNORMAL HIGH (ref 232–1245)

## 2021-12-23 ENCOUNTER — Telehealth: Payer: Self-pay

## 2021-12-23 ENCOUNTER — Other Ambulatory Visit (HOSPITAL_COMMUNITY): Payer: Self-pay

## 2021-12-23 NOTE — Chronic Care Management (AMB) (Signed)
   Care Guide Note  12/23/2021 Name: Sharon Hunt MRN: [id number] DOB: 1970/06/23  Referred by: Argentina Donovan, PA-C Reason for referral : Care Coordination (Outreach to schedule with Pharm D )   Sharon Hunt is a 51 y.o. year old female who is a primary care patient of Argentina Donovan, [place]. Sharon Hunt was referred to the pharmacist for assistance related to HTN.    Successful contact was made with the patient to discuss pharmacy services including being ready for the pharmacist to call at least 5 minutes before the scheduled appointment time, to have medication bottles and any blood sugar or blood pressure readings ready for review. The patient agreed to meet with the pharmacist via with the pharmacist via telephone visit on (date/time).  12/26/2021  Noreene Larsson, [place], [place] [address] Direct Dial: [phone] [email or web address]

## 2021-12-23 NOTE — Progress Notes (Signed)
Patient has uncontrolled type 2 diabetes, start metformin '500mg'$  BID if she tolerates medication well she should increase metformin to '1000mg'$  BID after one week.  It is very important that she avoid sugar, sweets , soda.   Liver enzymes are elevated , patient should avoid alcohol to prevent liver damage.   Getting her diabetes under control will help her neuropathy a lot   Vitamin B12 level is elevated avoid B12 supplement.   I put in a referral to our pharmacist to assist her with medication assistance and compliance , she should be expecting a call form them .   Patient should follow up as planned

## 2021-12-25 ENCOUNTER — Telehealth: Payer: Self-pay

## 2021-12-25 NOTE — Telephone Encounter (Signed)
Message from [place]: [4:11 PM] Vena Rua  not sure about the swab that ms. Igoe is talking about, please check with lab. she can come back to get it done. we had planned to get her swabbed on last Friday but it was not done.   LMOM for patient to call back.

## 2021-12-26 ENCOUNTER — Other Ambulatory Visit: Payer: Self-pay | Admitting: Nurse Practitioner

## 2021-12-26 ENCOUNTER — Other Ambulatory Visit (HOSPITAL_COMMUNITY): Payer: Self-pay

## 2021-12-26 ENCOUNTER — Other Ambulatory Visit: Payer: Self-pay | Admitting: Pharmacist

## 2021-12-26 DIAGNOSIS — R748 Abnormal levels of other serum enzymes: Secondary | ICD-10-CM

## 2021-12-26 DIAGNOSIS — E1140 Type 2 diabetes mellitus with diabetic neuropathy, unspecified: Secondary | ICD-10-CM

## 2021-12-26 DIAGNOSIS — N760 Acute vaginitis: Secondary | ICD-10-CM

## 2021-12-26 MED ORDER — FREESTYLE LIBRE 3 SENSOR MISC
11 refills | Status: DC
  Filled 2021-12-26: qty 2, fill #0
  Filled 2021-12-31: qty 2, 28d supply, fill #0
  Filled 2022-01-27 (×2): qty 2, 28d supply, fill #1
  Filled 2022-04-28: qty 2, 28d supply, fill #2

## 2021-12-26 MED ORDER — FREESTYLE LIBRE 14 DAY READER DEVI
11 refills | Status: DC
  Filled 2021-12-26: qty 1, fill #0

## 2021-12-26 NOTE — Progress Notes (Signed)
Will check serology  for Hep B and C at next  follow up

## 2021-12-26 NOTE — Progress Notes (Signed)
12/26/2021 Name: MONTINE HIGHT MRN: [id number] DOB: April 25, 1970  Chief Complaint  Patient presents with   Medication Management   Diabetes   Hypertension   Hyperlipidemia    Sharon Hunt is a 51 y.o. year old female who presented for a telephone visit.   They were referred to the pharmacist by their PCP for assistance in managing diabetes.    Subjective:  Care Team: Primary Care Provider: Vena Rua ; Next Scheduled Visit: 01/17/22  Medication Access/Adherence  Current Pharmacy:  [facility] [address] [place] [address] Phone: [phone] Fax: [facility] [address] [place] [address] Phone: [phone] Fax: [phone]   Patient reports affordability concerns with their medications: No  Patient reports access/transportation concerns to their pharmacy: No  Patient reports adherence concerns with their medications:  No    Reports she has been abstinent from alcohol since May. Was overwhelmed by her new diagnosis of diabetes. Biggest concern today is chronic neuropathy, likely related to prior alcohol use as the neuropathy preceded the recent elevation in sugars.   Diabetes:  Current medications: metformin 500 mg twice daily  Current glucose readings: does not have a meter  Patient denies hypoglycemic s/sx including dizziness, shakiness, sweating. Patient reports hyperglycemic symptoms including polyuria, polydipsia, nocturia, neuropathy,   Reports significant peripheral neuropathy, though has preceded diabetes diagnosis. Difficulty walking, cannot go up stairs by herself.   Current meal patterns:  - Breakfast: eggs, bacon, orange juice; sometimes a carb; sometimes watermelon;  - Lunch: sandwich; baked salmon, asparagus;  - Supper: not as hungry at supper - Snacks: nuts, sunflower seeds; 2 twix candy every day - Drinks: recently changed to diet coke;  daughter bought Olipop drinks. Previously had been drinking lots of sodas to help remain abstinent from alcohol.  Current physical activity: limited by neuropathy   Hypertension:  Current medications: amlodipine 5 mg daily  Hyperlipidemia/ASCVD Risk Reduction  Current lipid lowering medications: none  No lipid panel on file  Neuropathy:  Current medications: gabapentin 300 mg three times daily - reports she does not feel much benefit from this  Reports heating pad is helpful, aspercream is not. Heat is helpful.   Did not take thiamine, folate after hospitalization in May  Health Maintenance  Health Maintenance Due  Topic Date Due   COVID-19 Vaccine (1) Never done   FOOT EXAM  Never done   OPHTHALMOLOGY EXAM  Never done   Diabetic kidney evaluation - Urine ACR  Never done   Hepatitis C Screening  Never done   TETANUS/TDAP  Never done   COLONOSCOPY (Pts 45-6yr Insurance coverage will need to be confirmed)  Never done   PAP SMEAR-Modifier  03/07/2016   MAMMOGRAM  Never done   Zoster Vaccines- Shingrix (1 of 2) Never done     Objective: Lab Results  Component Value Date   HGBA1C 12.3 (A) 12/20/2021   HGBA1C 12.3 12/20/2021   HGBA1C 12.3 (A) 12/20/2021   HGBA1C 12.3 (A) 12/20/2021    Lab Results  Component Value Date   CREATININE 0.83 12/20/2021   BUN 6 12/20/2021   NA 126 (L) 12/20/2021   K 4.1 12/20/2021   CL 82 (L) 12/20/2021   CO2 21 12/20/2021    Lab Results  Component Value Date   CHOL 219 (H) 04/04/2015   HDL 39 (L) 04/04/2015   LDLCALC 149 (H) 04/04/2015   TRIG 154 (H) 04/04/2015  CHOLHDL 5.6 (H) 04/04/2015    Medications Reviewed Today     Reviewed by Osker Mason, RPH-CPP (Pharmacist) on 12/26/21 at 1042  Med List Status: <None>   Medication Order Taking? Sig Documenting Provider Last Dose Status Informant  aluminum-magnesium hydroxide-simethicone (MAALOX) 166-063-01 MG/5ML SUSP [id number]  Take 30 mLs by mouth in the morning and at  bedtime.  Patient not taking: Reported on 12/20/2021   [provider]  Active Self  amLODipine (NORVASC) 5 MG tablet [id number] Yes Take 1 tablet (5 mg total) by mouth daily. Renee Rival, FNP Taking Active   Continuous Blood Gluc Receiver (FREESTYLE LIBRE 14 DAY READER) [place] [id number] Yes Use up to 4 times a day as directed. Tresa Garter, MD  Active   Continuous Blood Gluc Sensor (FREESTYLE LIBRE 3 SENSOR) [place] [id number] Yes Place 1 sensor on the skin every 14 days. Use to check glucose continuously Tresa Garter, MD  Active   diphenhydrAMINE (BENADRYL) 25 MG tablet [id number]  Take 50 mg by mouth every 6 (six) hours as needed for allergies.  Patient not taking: Reported on 12/20/2021   [provider]  Active Self  folic acid (FOLVITE) 1 MG tablet [id number]  Take 1 tablet (1 mg total) by mouth daily.  Patient not taking: Reported on 12/20/2021   Eugenie Filler, MD  Active   gabapentin (NEURONTIN) 300 MG capsule [id number] Yes Take 1 capsule (300 mg total) by mouth 3 (three) times daily. Renee Rival, FNP Taking Active   hydrocerin (EUCERIN) CREA [id number]  Apply 1 application. topically 3 (three) times daily.  Patient not taking: Reported on 12/20/2021   Eugenie Filler, MD  Active   hydrOXYzine (ATARAX) 25 MG tablet [id number]  Take 1 tablet (25 mg total) by mouth 3 (three) times daily as needed for anxiety. Eugenie Filler, MD  Active   lisinopril (ZESTRIL) 40 MG tablet [id number]  Take 1 tablet (40 mg total) by mouth daily.  Patient not taking: Reported on 12/20/2021   Eugenie Filler, MD  Active   metFORMIN (GLUCOPHAGE) 500 MG tablet [id number] Yes Take 1 tablet (500 mg total) by mouth 2 (two) times daily with a meal. [place], Dewaine Conger, FNP Taking Active   Multiple Vitamin (MULTIVITAMIN WITH MINERALS) TABS tablet [id number]  Take 1 tablet by mouth daily.  Patient not taking: Reported on 12/20/2021   Eugenie Filler, MD  Active    nicotine (NICODERM CQ - DOSED IN MG/24 HOURS) 21 mg/24hr patch [id number]  Place 1 patch (21 mg total) onto the skin daily.  Patient not taking: Reported on 12/20/2021   Eugenie Filler, MD  Active   thiamine 100 MG tablet [id number]  Take 1 tablet (100 mg total) by mouth daily.  Patient not taking: Reported on 12/20/2021   Eugenie Filler, MD  Active               Assessment/Plan:   Sharon Hunt:   Diabetes: - Currently uncontrolled, newly diagnosed. Extensive discussion regarding diagnosis, pathophysiology, mechanism of action of metformin. Empathetic listening given to patient's feelings of being frustrated and overwhelmed, and also limited by neuropathy.  - Reviewed long term cardiovascular and renal outcomes of uncontrolled blood sugar - Reviewed dietary modifications including: moderation of carbohydrate intake. Will collaborate with team on referral to nutritionist/DM education moving forward.  - Discussed potential to increase metformin to 1000 mg twice daily at this time, but patient would prefer to wait a few more weeks  before increasing dose. Feels overwhelmed by recent changes. Agree with this plan.  - Patient requests script for CGM. Discussed that it could be expensive without insurance, but she reports that her son requested she ask for a prescription and he would pay for it. Discussed use of Libre 3. Order placed via systemwide standing order. Order placed for glucometer in case patient changes her mind on use of Libre.    Hypertension: - Currently uncontrolled at last visit - Will discuss moving forward. Would advise against ACEI, preferentially use ARB given higher risk of angioedema in african american patients.   Hyperlipidemia/ASCVD Risk Reduction: - Currently unknown control.  - Will discuss moving forward.    Follow up plan: phone call in 2 weeks  Catie TJodi Mourning, PharmD, [facility] [phone]

## 2021-12-27 ENCOUNTER — Ambulatory Visit: Payer: Self-pay

## 2021-12-27 ENCOUNTER — Telehealth: Payer: Self-pay | Admitting: Clinical

## 2021-12-27 NOTE — Telephone Encounter (Signed)
Integrated Behavioral Health Case Management Referral Note  12/27/2021 Name: TORA PRUNTY MRN: [id number] DOB: Jul 26, 1970 CAILIN GEBEL is a 51 y.o. year old female who sees [place], Dionne Bucy, [place] for primary care. LCSW was consulted to assess patient's needs and assist the patient with [employer]  and Financial Difficulties related to lack of income and lack of health coverage .  Interpreter: No.   Interpreter Name & Language: none  Assessment: Patient experiencing financial difficulties related to lack of health coverage and income. She has not been able to work since last year. She would like to apply for social security disability.  Intervention: Called patient today and discussed financial assistance applications. Patient also needs to apply for food stamps and disability. Scheduled appointment for patient to come in to Patient [facility]) to start CAFA and [employer] on 16/83/72.  Review of patient status, including review of consultants reports, relevant laboratory and other test results, and collaboration with appropriate care team members and the patient's provider was performed as part of comprehensive patient evaluation and provision of services.    Estanislado Emms, [facility] [phone]

## 2021-12-30 NOTE — Progress Notes (Signed)
Trich is positive, the medication  for trich  treatment - metronidazole.  Patient will need to completely avoid alcohol while taking this med and for 72 hours after taking the med to prevent adverse effects including nausea, vomiting, dizziness, fainting, . If she agree to avoid alcohol will treat with metronidazole 2g one time dose. Her sexual partner should also be tested and treated as well. ,

## 2021-12-31 ENCOUNTER — Other Ambulatory Visit (HOSPITAL_COMMUNITY): Payer: Self-pay

## 2021-12-31 ENCOUNTER — Ambulatory Visit: Payer: Self-pay | Admitting: Clinical

## 2021-12-31 ENCOUNTER — Other Ambulatory Visit: Payer: Self-pay | Admitting: Nurse Practitioner

## 2021-12-31 DIAGNOSIS — B9689 Other specified bacterial agents as the cause of diseases classified elsewhere: Secondary | ICD-10-CM

## 2021-12-31 DIAGNOSIS — A5901 Trichomonal vulvovaginitis: Secondary | ICD-10-CM

## 2021-12-31 DIAGNOSIS — Z5989 Other problems related to housing and economic circumstances: Secondary | ICD-10-CM

## 2021-12-31 LAB — NUSWAB VAGINITIS PLUS (VG+)
Atopobium vaginae: HIGH Score — AB
Candida albicans, NAA: NEGATIVE
Candida glabrata, NAA: NEGATIVE
Chlamydia trachomatis, NAA: NEGATIVE
Neisseria gonorrhoeae, NAA: NEGATIVE
Trich vag by NAA: POSITIVE — AB

## 2021-12-31 MED ORDER — CLINDAMYCIN PHOSPHATE 2 % VA CREA
1.0000 | TOPICAL_CREAM | Freq: Every day | VAGINAL | 0 refills | Status: AC
  Filled 2021-12-31: qty 40, 7d supply, fill #0

## 2021-12-31 MED ORDER — CLINDAMYCIN PHOSPHATE 2 % VA GEL
5.0000 g | Freq: Every day | VAGINAL | 0 refills | Status: DC
  Filled 2021-12-31: qty 35, 7d supply, fill #0

## 2021-12-31 MED ORDER — METRONIDAZOLE 500 MG PO TABS
ORAL_TABLET | ORAL | 0 refills | Status: DC
  Filled 2021-12-31 (×2): qty 4, 1d supply, fill #0

## 2021-12-31 NOTE — Progress Notes (Signed)
Integrated Behavioral Health General Follow Up Note  12/31/2021 Name: Sharon Hunt MRN: [id number] DOB: 12/08/1970 Sharon Hunt is a 51 y.o. year old female who sees [place], Dionne Bucy, [place] for primary care. LCSW was initially consulted to assist the patient with [employer]  and Financial Difficulties related to lack of income and lack of health coverage.  Interpreter: No.   Interpreter Name & Language: none  Assessment: Patient experiencing financial difficulties related to lack of health coverage and income. She has not been able to work since last year. She would like to apply for social security disability.  Ongoing Intervention: CSW, patient, and patient's daughter met at the Patient [facility]). Provided patient with [place], and [employer] applications. Reviewed application and advised patient on supporting documents to be submitted with the application. Patient did have some of the documents ready today. She will collect the remaining ones and then contact CSW.   Also reviewed [facility] referral for assistance with disability claim. Patient consented to referral. Advised patient to go to social services office to apply for food stamps, or she may do the application online.  Provided CSW contact information.   Review of patient status, including review of consultants reports, relevant laboratory and other test results, and collaboration with appropriate care team members and the patient's provider was performed as part of comprehensive patient evaluation and provision of services.    Estanislado Emms, [facility] [phone]

## 2021-12-31 NOTE — Progress Notes (Signed)
BV..I have ordered clindamycin 2% vaginal gel, insert 5g into the vaginal daily for 7 days   Trich vag.  I have ordered metronidazole 2000 mg one time dose, avoid alcohol 72 hours after taking the med to prevent adverse effects

## 2022-01-01 ENCOUNTER — Encounter: Payer: Self-pay | Admitting: Clinical

## 2022-01-01 NOTE — Progress Notes (Signed)
Integrated Behavioral Health General Follow Up Note  01/01/2022 Name: BENTLIE WITHEM MRN: [id number] DOB: 12/13/1970 LAKETHIA COPPESS is a 51 y.o. year old female who sees [place], Dionne Bucy, [place] for primary care. LCSW was initially consulted to assist the patient with [employer]  and Financial Difficulties related to lack of income and lack of health coverage.  Interpreter: No.   Interpreter Name & Language: none  Assessment: Patient experiencing financial difficulties related to lack of health coverage and income.  Ongoing Intervention: Patient returned to Patient [facility]) today and submitted her [employer] and [employer]FA and [employer] applications to CSW, along with most supporting documents. Advised patient of one remaining document needed for the applications. Patient to submit and then CSW will submit patient's applications.  Review of patient status, including review of consultants reports, relevant laboratory and other test results, and collaboration with appropriate care team members and the patient's provider was performed as part of comprehensive patient evaluation and provision of services.    Estanislado Emms, [facility] [phone]

## 2022-01-03 ENCOUNTER — Telehealth: Payer: Self-pay | Admitting: Clinical

## 2022-01-03 NOTE — Telephone Encounter (Signed)
Patient submitted remaining supporting documents for her [employer]FA, [employer], and [employer]. CSW submitted these applications for patient.  [facility] [phone]

## 2022-01-09 ENCOUNTER — Other Ambulatory Visit: Payer: Self-pay | Admitting: Pharmacist

## 2022-01-09 ENCOUNTER — Other Ambulatory Visit (HOSPITAL_COMMUNITY): Payer: Self-pay

## 2022-01-09 NOTE — Progress Notes (Signed)
01/09/2022 Name: Sharon Hunt MRN: [id number] DOB: 07-Feb-1971  Chief Complaint  Patient presents with   Medication Management   Diabetes    Sharon Hunt is a 51 y.o. year old female who presented for a telephone visit.   They were referred to the pharmacist by their PCP for assistance in managing diabetes.   Subjective:  Care Team: Primary Care Provider: Lazaro Arms; Next Scheduled Visit: 01/17/22  Medication Access/Adherence  Current Pharmacy:  [facility] [address] [address] Phone: [phone] Fax: [facility] [address] [place] [address] Phone: [phone] Fax: [phone]   Patient reports affordability concerns with their medications: No  Patient reports access/transportation concerns to their pharmacy: No  Patient reports adherence concerns with their medications:  No     Diabetes:  Current medications: metformin 500 mg twice daily  Does report some periodic diarrhea, but occasionally constipation.   Date of Download: 12/27/21-01/09/22 % Time CGM is active: 61% Average Glucose: 123 mg/dL Glucose Management Indicator: 6.3  Glucose Variability: 21.9 (goal <36%) Time in Goal:  - Time in range 70-180: 96% - Time above range: 4% - Time below range: 0%   Patient denies hypoglycemic s/sx including dizziness, shakiness, sweating.    Health Maintenance  Health Maintenance Due  Topic Date Due   COVID-19 Vaccine (1) Never done   FOOT EXAM  Never done   OPHTHALMOLOGY EXAM  Never done   Diabetic kidney evaluation - Urine ACR  Never done   Hepatitis C Screening  Never done   TETANUS/TDAP  Never done   COLONOSCOPY (Pts 45-51yr Insurance coverage will need to be confirmed)  Never done   PAP SMEAR-Modifier  03/07/2016   MAMMOGRAM  Never done   Zoster Vaccines- Shingrix (1 of 2) Never done     Objective: Lab Results  Component Value  Date   HGBA1C 12.3 (A) 12/20/2021   HGBA1C 12.3 12/20/2021   HGBA1C 12.3 (A) 12/20/2021   HGBA1C 12.3 (A) 12/20/2021    Lab Results  Component Value Date   CREATININE 0.83 12/20/2021   BUN 6 12/20/2021   NA 126 (L) 12/20/2021   K 4.1 12/20/2021   CL 82 (L) 12/20/2021   CO2 21 12/20/2021    Lab Results  Component Value Date   CHOL 219 (H) 04/04/2015   HDL 39 (L) 04/04/2015   LDLCALC 149 (H) 04/04/2015   TRIG 154 (H) 04/04/2015   CHOLHDL 5.6 (H) 04/04/2015    Medications Reviewed Today     Reviewed by HOsker Hunt RPH-CPP (Pharmacist) on 12/26/21 at 1042  Med List Status: <None>   Medication Order Taking? Sig Documenting Provider Last Dose Status Informant  aluminum-magnesium hydroxide-simethicone (MAALOX) 2270-623-76MG/5ML SUSP [id number] Take 30 mLs by mouth in the morning and at bedtime.  Patient not taking: Reported on 12/20/2021   [provider]  Active Self  amLODipine (NORVASC) 5 MG tablet [id number] Take 1 tablet (5 mg total) by mouth daily. PRenee Rival FNP Taking Active   Continuous Blood Gluc Receiver (FREESTYLE LIBRE 14 DAY READER) DEVI [id number] Use up to 4 times a day as directed. JTresa Garter MD  Active   Continuous Blood Gluc Sensor (FREESTYLE LIBRE 3 SENSOR) [id number] Place 1 sensor on the skin every 14 days. Use to check glucose continuously JTresa Garter MD  Active   diphenhydrAMINE (BENADRYL) 25 MG tablet  [id number]  Take 50 mg by mouth every 6 (six) hours as needed for allergies.  Patient not taking: Reported on 12/20/2021   [provider]  Active Self  folic acid (FOLVITE) 1 MG tablet [id number]  Take 1 tablet (1 mg total) by mouth daily.  Patient not taking: Reported on 12/20/2021   Eugenie Filler, MD  Active   gabapentin (NEURONTIN) 300 MG capsule [id number] Yes Take 1 capsule (300 mg total) by mouth 3 (three) times daily. Renee Rival, FNP Taking Active   hydrocerin (EUCERIN)  CREA [id number]  Apply 1 application. topically 3 (three) times daily.  Patient not taking: Reported on 12/20/2021   Eugenie Filler, MD  Active   hydrOXYzine (ATARAX) 25 MG tablet [id number]  Take 1 tablet (25 mg total) by mouth 3 (three) times daily as needed for anxiety. Eugenie Filler, MD  Active   lisinopril (ZESTRIL) 40 MG tablet [id number]  Take 1 tablet (40 mg total) by mouth daily.  Patient not taking: Reported on 12/20/2021   Eugenie Filler, MD  Active   metFORMIN (GLUCOPHAGE) 500 MG tablet [id number] Yes Take 1 tablet (500 mg total) by mouth 2 (two) times daily with a meal. [place], Dewaine Conger, FNP Taking Active   Multiple Vitamin (MULTIVITAMIN WITH MINERALS) TABS tablet [id number]  Take 1 tablet by mouth daily.  Patient not taking: Reported on 12/20/2021   Eugenie Filler, MD  Active   nicotine (NICODERM CQ - DOSED IN MG/24 HOURS) 21 mg/24hr patch [id number]  Place 1 patch (21 mg total) onto the skin daily.  Patient not taking: Reported on 12/20/2021   Eugenie Filler, MD  Active   thiamine 100 MG tablet [id number]  Take 1 tablet (100 mg total) by mouth daily.  Patient not taking: Reported on 12/20/2021   Eugenie Filler, MD  Active               Assessment/Plan:   Diabetes: - Currently controlled per CGM readings - Reviewed goal A1c, goal fasting, and goal 2 hour post prandial glucose - Discussed changing to XR metformin, though patient declines as she doesn't want to change anything at this time. Encouraged to discuss with Lazaro Arms at next appointment as needed.  - Recommend to continue current regimen  - Recommend to check glucose using CGM. Can turn off low glucose alarm.     Follow Up Plan: phone call in 6 weeks  Catie TJodi Mourning, PharmD, [facility] [phone]

## 2022-01-17 ENCOUNTER — Other Ambulatory Visit (HOSPITAL_COMMUNITY): Payer: Self-pay

## 2022-01-17 ENCOUNTER — Ambulatory Visit: Payer: Self-pay | Admitting: Nurse Practitioner

## 2022-01-17 ENCOUNTER — Ambulatory Visit (INDEPENDENT_AMBULATORY_CARE_PROVIDER_SITE_OTHER): Payer: Self-pay | Admitting: Nurse Practitioner

## 2022-01-17 VITALS — BP 145/82 | HR 99 | Wt 152.8 lb

## 2022-01-17 DIAGNOSIS — E1140 Type 2 diabetes mellitus with diabetic neuropathy, unspecified: Secondary | ICD-10-CM

## 2022-01-17 MED ORDER — METFORMIN HCL ER 500 MG PO TB24
500.0000 mg | ORAL_TABLET | Freq: Every day | ORAL | 0 refills | Status: DC
  Filled 2022-01-17: qty 30, 30d supply, fill #0

## 2022-01-17 NOTE — Progress Notes (Signed)
@Patient  ID: Sharon Hunt, female    DOB: December 18, 1970, 51 y.o.   MRN: [id number]  Chief Complaint  Patient presents with   Follow-up    Referring provider: Argentina Donovan, PA-C  HPI  51 year old female with history of hypertension, GERD, GI bleed, diabetes, neuropathy, alcohol abuse, and tobacco abuse.    Patient presents today for follow-up visit.  Patient is currently on metformin for diabetes.  Currently on lisinopril and Norvasc for hypertension.  Vital signs stable in office today.  Denies f/c/s, n/v/d, hemoptysis, PND, leg swelling Denies chest pain or edema   Allergies  Allergen Reactions   Coconut Flavor Itching     There is no immunization history on file for this patient.  Past Medical History:  Diagnosis Date   GERD (gastroesophageal reflux disease)    H/O hiatal hernia    Hyperlipidemia    Hypertension    Type 2 diabetes mellitus with chronic painful diabetic neuropathy ([place]) 12/20/2021    Tobacco History: Social History   Tobacco Use  Smoking Status Every Day   Packs/day: 1   Types: Cigarettes  Smokeless Tobacco Not on file   Ready to quit: Not Answered Counseling given: Not Answered   Outpatient Encounter Medications as of 01/17/2022  Medication Sig   Continuous Blood Gluc Receiver (FREESTYLE LIBRE 14 DAY READER) DEVI Use up to 4 times a day as directed.   gabapentin (NEURONTIN) 300 MG capsule Take 1 capsule (300 mg total) by mouth 3 (three) times daily.   [DISCONTINUED] amLODipine (NORVASC) 5 MG tablet Take 1 tablet (5 mg total) by mouth daily.   [DISCONTINUED] Continuous Blood Gluc Sensor (FREESTYLE LIBRE 3 SENSOR) MISC Place 1 sensor on the skin every 14 days. Use to check glucose continuously   [DISCONTINUED] metFORMIN (GLUCOPHAGE) 500 MG tablet Take 1 tablet (500 mg total) by mouth 2 (two) times daily with a meal.   [DISCONTINUED] metFORMIN (GLUCOPHAGE-XR) 500 MG 24 hr tablet Take 1 tablet (500 mg total) by mouth daily with breakfast.    aluminum-magnesium hydroxide-simethicone (MAALOX) 200-200-20 MG/5ML SUSP Take 30 mLs by mouth in the morning and at bedtime.   folic acid (FOLVITE) 1 MG tablet Take 1 tablet (1 mg total) by mouth daily.   hydrocerin (EUCERIN) CREA Apply 1 application. topically 3 (three) times daily.   [DISCONTINUED] diphenhydrAMINE (BENADRYL) 25 MG tablet Take 50 mg by mouth every 6 (six) hours as needed for allergies. (Patient not taking: Reported on 12/20/2021)   [DISCONTINUED] hydrOXYzine (ATARAX) 25 MG tablet Take 1 tablet (25 mg total) by mouth 3 (three) times daily as needed for anxiety.   [DISCONTINUED] lisinopril (ZESTRIL) 40 MG tablet Take 1 tablet (40 mg total) by mouth daily.   [DISCONTINUED] metroNIDAZOLE (FLAGYL) 500 MG tablet Take 4 tablets (2000 mg) by mouth as a one time dose for Trichomonal infection . Avoid alcohol  72 hours after taking the medication to avoid adverse effects including nausea, vomiting   [DISCONTINUED] Multiple Vitamin (MULTIVITAMIN WITH MINERALS) TABS tablet Take 1 tablet by mouth daily.   [DISCONTINUED] nicotine (NICODERM CQ - DOSED IN MG/24 HOURS) 21 mg/24hr patch Place 1 patch (21 mg total) onto the skin daily.   [DISCONTINUED] thiamine 100 MG tablet Take 1 tablet (100 mg total) by mouth daily.   No facility-administered encounter medications on file as of 01/17/2022.     Review of Systems  Review of Systems  Constitutional: Negative.   HENT: Negative.    Cardiovascular: Negative.   Gastrointestinal: Negative.  Allergic/Immunologic: Negative.   Neurological: Negative.   Psychiatric/Behavioral: Negative.         Physical Exam  BP (!) 145/82   Pulse 99   Wt 152 lb 12.8 oz (69.3 kg)   SpO2 100%   BMI 25.43 kg/m   Wt Readings from Last 5 Encounters:  04/22/22 155 lb (70.3 kg)  03/24/22 154 lb (69.9 kg)  01/17/22 152 lb 12.8 oz (69.3 kg)  07/25/21 175 lb (79.4 kg)  01/06/19 185 lb 3 oz (84 kg)     Physical Exam Vitals and nursing note reviewed.   Constitutional:      General: She is not in acute distress.    Appearance: She is well-developed.  Cardiovascular:     Rate and Rhythm: Normal rate and regular rhythm.  Pulmonary:     Effort: Pulmonary effort is normal.     Breath sounds: Normal breath sounds.  Neurological:     Mental Status: She is alert and oriented to person, place, and time.      Lab Results:  CBC    Component Value Date/Time   WBC 11.7 (H) 07/30/2021 0149   RBC 3.75 (L) 07/30/2021 0149   HGB 13.5 07/30/2021 0149   HCT 36.0 07/30/2021 0149   HCT 32.1 (L) 01/06/2019 1819   PLT 189 07/30/2021 0149   MCV 96.0 07/30/2021 0149   MCH 36.0 (H) 07/30/2021 0149   MCHC 37.5 (H) 07/30/2021 0149   RDW 15.9 (H) 07/30/2021 0149   LYMPHSABS 2.4 07/30/2021 0149   MONOABS 0.9 07/30/2021 0149   EOSABS 0.2 07/30/2021 0149   BASOSABS 0.1 07/30/2021 0149    BMET    Component Value Date/Time   NA 126 (L) 12/20/2021 1612   K 4.1 12/20/2021 1612   CL 82 (L) 12/20/2021 1612   CO2 21 12/20/2021 1612   GLUCOSE 691 (HH) 12/20/2021 1612   GLUCOSE 121 (H) 07/30/2021 0149   BUN 6 12/20/2021 1612   CREATININE 0.83 12/20/2021 1612   CREATININE 0.75 04/04/2015 1206   CALCIUM 9.5 12/20/2021 1612   GFRNONAA >60 07/30/2021 0149   GFRNONAA >89 04/04/2015 1206   GFRAA >60 01/07/2019 0354   GFRAA >89 04/04/2015 1206    BNP No results found for: "BNP"  ProBNP No results found for: "PROBNP"  Imaging: No results found.   Assessment & Plan:   Type 2 diabetes mellitus with chronic painful diabetic neuropathy (HCC) - metFORMIN (GLUCOPHAGE-XR) 500 MG 24 hr tablet; Take 1 tablet (500 mg total) by mouth daily with breakfast.  Dispense: 30 tablet; Refill: 0 - Ambulatory referral to diabetic education  - eat 6 small meals throughout the day - high protein / low carb   Follow up:  Follow up in 1 month or sooner if needed     Fenton Foy, NP 06/04/2022

## 2022-01-17 NOTE — Patient Instructions (Addendum)
1. Type 2 diabetes mellitus with chronic painful diabetic neuropathy (HCC)  - metFORMIN (GLUCOPHAGE-XR) 500 MG 24 hr tablet; Take 1 tablet (500 mg total) by mouth daily with breakfast.  Dispense: 30 tablet; Refill: 0 - Ambulatory referral to diabetic education  - eat 6 small meals throughout the day - high protein / low carb   Follow up:  Follow up in 1 month or sooner if needed   Diabetes Mellitus Basics  Diabetes mellitus, or diabetes, is a long-term (chronic) disease. It occurs when the body does not properly use sugar (glucose) that is released from food after you eat. Diabetes mellitus may be caused by one or both of these problems: Your pancreas does not make enough of a hormone called insulin. Your body does not react in a normal way to the insulin that it makes. Insulin lets glucose enter cells in your body. This gives you energy. If you have diabetes, glucose cannot get into cells. This causes high blood glucose (hyperglycemia). How to treat and manage diabetes You may need to take insulin or other diabetes medicines daily to keep your glucose in balance. If you are prescribed insulin, you will learn how to give yourself insulin by injection. You may need to adjust the amount of insulin you take based on the foods that you eat. You will need to check your blood glucose levels using a glucose monitor as told by your health care provider. The readings can help determine if you have low or high blood glucose. Generally, you should have these blood glucose levels: Before meals (preprandial): 80-130 mg/dL (4.4-7.2 mmol/L). After meals (postprandial): below 180 mg/dL (10 mmol/L). Hemoglobin A1c (HbA1c) level: less than 7%. Your health care provider will set treatment goals for you. Keep all follow-up visits. This is important. Follow these instructions at home: Diabetes medicines Take your diabetes medicines every day as told by your health care provider. List your diabetes  medicines here: Name of medicine: ______________________________ Amount (dose): _______________ Time (a.m./p.m.): _______________ Notes: ___________________________________ Name of medicine: ______________________________ Amount (dose): _______________ Time (a.m./p.m.): _______________ Notes: ___________________________________ Name of medicine: ______________________________ Amount (dose): _______________ Time (a.m./p.m.): _______________ Notes: ___________________________________ Insulin If you use insulin, list the types of insulin you use here: Insulin type: ______________________________ Amount (dose): _______________ Time (a.m./p.m.): _______________Notes: ___________________________________ Insulin type: ______________________________ Amount (dose): _______________ Time (a.m./p.m.): _______________ Notes: ___________________________________ Insulin type: ______________________________ Amount (dose): _______________ Time (a.m./p.m.): _______________ Notes: ___________________________________ Insulin type: ______________________________ Amount (dose): _______________ Time (a.m./p.m.): _______________ Notes: ___________________________________ Insulin type: ______________________________ Amount (dose): _______________ Time (a.m./p.m.): _______________ Notes: ___________________________________ Managing blood glucose  Check your blood glucose levels using a glucose monitor as told by your health care provider. Write down the times that you check your glucose levels here: Time: _______________ Notes: ___________________________________ Time: _______________ Notes: ___________________________________ Time: _______________ Notes: ___________________________________ Time: _______________ Notes: ___________________________________ Time: _______________ Notes: ___________________________________ Time: _______________ Notes: ___________________________________  Low blood glucose Low blood  glucose (hypoglycemia) is when glucose is at or below 70 mg/dL (3.9 mmol/L). Symptoms may include: Feeling: Hungry. Sweaty and clammy. Irritable or easily upset. Dizzy. Sleepy. Having: A fast heartbeat. A headache. A change in your vision. Numbness around the mouth, lips, or tongue. Having trouble with: Moving (coordination). Sleeping. Treating low blood glucose To treat low blood glucose, eat or drink something containing sugar right away. If you can think clearly and swallow safely, follow the 15:15 rule: Take 15 grams of a fast-acting carb (carbohydrate), as told by your health care provider. Some fast-acting carbs are: Glucose tablets: take  3-4 tablets. Hard candy: eat 3-5 pieces. Fruit juice: drink 4 oz (120 mL). Regular (not diet) soda: drink 4-6 oz (120-180 mL). Honey or sugar: eat 1 Tbsp (15 mL). Check your blood glucose levels 15 minutes after you take the carb. If your glucose is still at or below 70 mg/dL (3.9 mmol/L), take 15 grams of a carb again. If your glucose does not go above 70 mg/dL (3.9 mmol/L) after 3 tries, get help right away. After your glucose goes back to normal, eat a meal or a snack within 1 hour. Treating very low blood glucose If your glucose is at or below 54 mg/dL (3 mmol/L), you have very low blood glucose (severe hypoglycemia). This is an emergency. Do not wait to see if the symptoms will go away. Get medical help right away. Call your local emergency services (911 in the [place] Do not drive yourself to the hospital. Questions to ask your health care provider Should I talk with a diabetes educator? What equipment will I need to care for myself at home? What diabetes medicines do I need? When should I take them? How often do I need to check my blood glucose levels? What number can I call if I have questions? When is my follow-up visit? Where can I find a support group for people with diabetes? Where to find more information American Diabetes  Association: www.diabetes.org Association of Diabetes Care and Education Specialists: www.diabeteseducator.org Contact a health care provider if: Your blood glucose is at or above 240 mg/dL (13.3 mmol/L) for 2 days in a row. You have been sick or have had a fever for 2 days or more, and you are not getting better. You have any of these problems for more than 6 hours: You cannot eat or drink. You feel nauseous. You vomit. You have diarrhea. Get help right away if: Your blood glucose is lower than 54 mg/dL (3 mmol/L). You get confused. You have trouble thinking clearly. You have trouble breathing. These symptoms may represent a serious problem that is an emergency. Do not wait to see if the symptoms will go away. Get medical help right away. Call your local emergency services (911 in the [place] Do not drive yourself to the hospital. Summary Diabetes mellitus is a chronic disease that occurs when the body does not properly use sugar (glucose) that is released from food after you eat. Take insulin and diabetes medicines as told. Check your blood glucose every day, as often as told. Keep all follow-up visits. This is important. This information is not intended to replace advice given to you by your health care provider. Make sure you discuss any questions you have with your health care provider. Document Revised: 07/05/2019 Document Reviewed: 07/05/2019 Elsevier Patient Education  [place].

## 2022-01-20 ENCOUNTER — Telehealth: Payer: Self-pay | Admitting: Clinical

## 2022-01-20 NOTE — Telephone Encounter (Signed)
Integrated Behavioral Health General Follow Up Note  01/20/2022 Name: Sharon Hunt MRN: [id number] DOB: 1970/09/14 Sharon Hunt is a 51 y.o. year old female who sees [place], Dionne Bucy, [place] for primary care. LCSW was initially consulted to assist the patient with [employer]  and Financial Difficulties related to lack of income and lack of health coverage.  Interpreter: No.   Interpreter Name & Language: none  Assessment: Patient experiencing financial difficulties related to lack of health coverage and income.  Ongoing Intervention: CSW called patient to follow up. Patient was in office on 01/17/22 and had questions about financial assistance applications, but CSW unable to meet with her at that time. Patient reported today that she had received a letter from [facility] and from [facility] stating she was missing supporting documentation for her applications. CSW confirmed these documents were submitted with applications. CSW following up with [employer] and [employer] to assist in resolving this issue.  Review of patient status, including review of consultants reports, relevant laboratory and other test results, and collaboration with appropriate care team members and the patient's provider was performed as part of comprehensive patient evaluation and provision of services.    Estanislado Emms, [facility] [phone]

## 2022-01-27 ENCOUNTER — Other Ambulatory Visit (HOSPITAL_COMMUNITY): Payer: Self-pay

## 2022-01-28 ENCOUNTER — Other Ambulatory Visit (HOSPITAL_COMMUNITY): Payer: Self-pay

## 2022-01-29 ENCOUNTER — Telehealth: Payer: Self-pay

## 2022-01-29 NOTE — Progress Notes (Signed)
   Care Guide Note  01/29/2022 Name: Sharon Hunt MRN: [id number] DOB: March 26, 1970  Referred by: Argentina Donovan, PA-C Reason for referral : Care Coordination (Outreach to reschedule f/u with Pharm D Catie due to meeting )   Sharon Hunt is a 51 y.o. year old female who is a primary care patient of Argentina Donovan, [place]. Sharon Hunt was referred to the pharmacist for assistance related to DM.    An unsuccessful telephone outreach was attempted today to contact the patient who was referred to the pharmacy team for assistance with medication management. Additional attempts will be made to contact the patient.   Sharon Hunt, [place], [place] [address] Direct Dial: [phone] [email or web address]

## 2022-02-07 ENCOUNTER — Other Ambulatory Visit (HOSPITAL_COMMUNITY): Payer: Self-pay

## 2022-02-17 ENCOUNTER — Ambulatory Visit: Payer: Self-pay | Admitting: Nurse Practitioner

## 2022-02-20 ENCOUNTER — Ambulatory Visit: Payer: Self-pay | Admitting: Nurse Practitioner

## 2022-02-24 ENCOUNTER — Encounter: Payer: Self-pay | Admitting: Dietician

## 2022-02-25 ENCOUNTER — Other Ambulatory Visit: Payer: Self-pay | Admitting: Pharmacist

## 2022-03-06 ENCOUNTER — Telehealth: Payer: Self-pay | Admitting: Clinical

## 2022-03-06 NOTE — Telephone Encounter (Signed)
Integrated Behavioral Health General Follow Up Note  03/06/2022 Name: Sharon Hunt MRN: [id number] DOB: 1970-07-16 Sharon Hunt is a 51 y.o. year old female who sees [place], Dionne Bucy, [place] for primary care. LCSW was initially consulted to assist the patient with [employer]  and Financial Difficulties related to lack of income and lack of health coverage.  Interpreter: No.   Interpreter Name & Language: none  Assessment: Patient experiencing financial difficulties related to lack of health coverage and income.  Ongoing Intervention: Patient called CSW with questions about financial assistance. CSW advised patient that she is approved for [employer] and [employer], though she may now be eligible for Medicaid under the expansion. She had cancelled several of her appointments because she could not afford the co-pays. Advised patient that we will see her at the Patient [facility]) even if she does not have the co-pay. Called the [facility] and confirmed financial process and patient's follow up with them.  Advised patient of her rescheduled appointment dates. Patient requested that we call her daughter to provide daughter with this information, as patient becomes confused and daughter helps keep track of things. Called daughter and reviewed everything with her. Will plan to see patient when she is here for her PCP follow up on 03/24/22.  Review of patient status, including review of consultants reports, relevant laboratory and other test results, and collaboration with appropriate care team members and the patient's provider was performed as part of comprehensive patient evaluation and provision of services.    Estanislado Emms, [facility] [phone]

## 2022-03-18 ENCOUNTER — Other Ambulatory Visit: Payer: Self-pay | Admitting: Pharmacist

## 2022-03-18 NOTE — Progress Notes (Signed)
03/18/2022 Name: Sharon Hunt MRN: [id number] DOB: 03-08-1971  Chief Complaint  Patient presents with   Medication Management   Hypertension   Diabetes    Sharon Hunt is a 52 y.o. year old female who presented for a telephone visit.   They were referred to the pharmacist by their PCP for assistance in managing diabetes and hypertension.    Subjective:  Care Team: Primary Care Provider: Fenton Foy, NP ; Next Scheduled Visit: 03/24/22  Medication Access/Adherence  Current Pharmacy:  [facility] [address] [place] [address] Phone: [phone] Fax: [facility] [address] [place] [address] Phone: [phone] Fax: [phone]   Patient reports affordability concerns with their medications: No  Patient reports access/transportation concerns to their pharmacy: No  Patient reports adherence concerns with their medications:  No     Diabetes:  Current medications: metformin XR 500 mg daily  Date of Download: 03/05/22-03/18/22 % Time CGM is active: 96% Average Glucose: 98 mg/dL Glucose Management Indicator: 5.7  Glucose Variability: 18.1 (goal <36%) Time in Goal:  - Time in range 70-180: 100% - Time above range: 0% - Time below range: 0%  Plans to see Nutritionist next month.   Hypertension:  Current medications: prescribed amlodipine 5 mg daily, lisinopril 40 mg daily - patient reports she feels poorly when she takes these medications, but isn't sure which. Has not been taking either lately  Patient has an automated, upper arm home BP cuff Current blood pressure readings readings: reports she has not been checking lately, but plans to start  Neuropathy: Current medications: gabapentin 300 mg three times daily; reports significant pain and continues to use acetaminophen PRN. Reports she did not sleep last night due to pain. Has been approved for [employer] and  now can schedule neurology referral.   Health Maintenance  Health Maintenance Due  Topic Date Due   COVID-19 Vaccine (1) Never done   FOOT EXAM  Never done   OPHTHALMOLOGY EXAM  Never done   Diabetic kidney evaluation - Urine ACR  Never done   Hepatitis C Screening  Never done   DTaP/Tdap/Td (1 - Tdap) Never done   COLONOSCOPY (Pts 45-37yr Insurance coverage will need to be confirmed)  Never done   PAP SMEAR-Modifier  03/07/2016   MAMMOGRAM  Never done   Zoster Vaccines- Shingrix (1 of 2) Never done     Objective: Lab Results  Component Value Date   HGBA1C 12.3 (A) 12/20/2021   HGBA1C 12.3 12/20/2021   HGBA1C 12.3 (A) 12/20/2021   HGBA1C 12.3 (A) 12/20/2021    Lab Results  Component Value Date   CREATININE 0.83 12/20/2021   BUN 6 12/20/2021   NA 126 (L) 12/20/2021   K 4.1 12/20/2021   CL 82 (L) 12/20/2021   CO2 21 12/20/2021    Lab Results  Component Value Date   CHOL 219 (H) 04/04/2015   HDL 39 (L) 04/04/2015   LDLCALC 149 (H) 04/04/2015   TRIG 154 (H) 04/04/2015   CHOLHDL 5.6 (H) 04/04/2015    Medications Reviewed Today     Reviewed by HOsker Mason RPH-CPP (Pharmacist) on 03/18/22 at 138 Med List Status: <None>   Medication Order Taking? Sig Documenting Provider Last Dose Status Informant  aluminum-magnesium hydroxide-simethicone (MAALOX) 2128-786-76MG/5ML SUSP [id number] Take 30 mLs by mouth in the morning and at bedtime.  Patient not taking: Reported on 12/20/2021  [provider]  Active Self  amLODipine (NORVASC) 5 MG tablet [id number] No Take 1 tablet (5 mg total) by mouth daily.  Patient not taking: Reported on 03/18/2022   Renee Rival, FNP Not Taking Active            Med Note Jodi Mourning, Loraine Grip Mar 18, 2022  1:07 PM)    Continuous Blood Gluc Receiver (FREESTYLE LIBRE 14 DAY READER) DEVI [id number] Yes Use up to 4 times a day as directed. Tresa Garter, MD Taking Active   Continuous Blood Gluc Sensor (FREESTYLE  LIBRE 3 SENSOR) [place] [id number]  Place 1 sensor on the skin every 14 days. Use to check glucose continuously Tresa Garter, MD  Active   folic acid (FOLVITE) 1 MG tablet [id number]  Take 1 tablet (1 mg total) by mouth daily.  Patient not taking: Reported on 12/20/2021   Eugenie Filler, MD  Active   gabapentin (NEURONTIN) 300 MG capsule [id number] Yes Take 1 capsule (300 mg total) by mouth 3 (three) times daily. Renee Rival, FNP Taking Active   hydrocerin (EUCERIN) CREA [id number] No Apply 1 application. topically 3 (three) times daily.  Patient not taking: Reported on 12/20/2021   Eugenie Filler, MD Not Taking Active   hydrOXYzine (ATARAX) 25 MG tablet [id number] No Take 1 tablet (25 mg total) by mouth 3 (three) times daily as needed for anxiety.  Patient not taking: Reported on 03/18/2022   Eugenie Filler, MD Not Taking Active   lisinopril (ZESTRIL) 40 MG tablet [id number] No Take 1 tablet (40 mg total) by mouth daily.  Patient not taking: Reported on 12/20/2021   Eugenie Filler, MD Not Taking Active   metFORMIN (GLUCOPHAGE-XR) 500 MG 24 hr tablet [id number] Yes Take 1 tablet (500 mg total) by mouth daily with breakfast. Fenton Foy, NP Taking Active   Multiple Vitamin (MULTIVITAMIN WITH MINERALS) TABS tablet [id number]  Take 1 tablet by mouth daily.  Patient not taking: Reported on 12/20/2021   Eugenie Filler, MD  Active   nicotine (NICODERM CQ - DOSED IN MG/24 HOURS) 21 mg/24hr patch [id number] No Place 1 patch (21 mg total) onto the skin daily.  Patient not taking: Reported on 12/20/2021   Eugenie Filler, MD Not Taking Active   thiamine 100 MG tablet [id number] No Take 1 tablet (100 mg total) by mouth daily.  Patient not taking: Reported on 12/20/2021   Eugenie Filler, MD Not Taking Active               Assessment/Plan:   Diabetes: - Currently controlled per CGM readings.  - Praised for improvement in glucose readings. Recommend to continue  current regimen at this time. A1c next week.    Hypertension: - Currently uncontrolled - Reviewed long term cardiovascular and renal outcomes of uncontrolled blood pressure - Reviewed appropriate blood pressure monitoring technique and reviewed goal blood pressure. Recommended to check home blood pressure and heart rate 2-3 times over the next week.  - If home readings remain elevated, recommend to restart either amlodipine OR an ARB, not both amlodipine and lisinopril. Will follow up with PCP next week  Neuropathy: - Uncontrolled - Collaborated with neurology scheduler to outreach patient to schedule with neurology, now that she has CAFA approval   Follow Up Plan: phone call in ~ 4 weeks  Catie Hedwig Morton, PharmD, [place] [phone]

## 2022-03-21 ENCOUNTER — Encounter: Payer: Self-pay | Admitting: Dietician

## 2022-03-24 ENCOUNTER — Ambulatory Visit: Payer: Self-pay | Admitting: Clinical

## 2022-03-24 ENCOUNTER — Encounter: Payer: Self-pay | Admitting: Nurse Practitioner

## 2022-03-24 ENCOUNTER — Ambulatory Visit (INDEPENDENT_AMBULATORY_CARE_PROVIDER_SITE_OTHER): Payer: Self-pay | Admitting: Nurse Practitioner

## 2022-03-24 VITALS — BP 219/102 | Ht 65.0 in | Wt 154.0 lb

## 2022-03-24 DIAGNOSIS — E1140 Type 2 diabetes mellitus with diabetic neuropathy, unspecified: Secondary | ICD-10-CM

## 2022-03-24 DIAGNOSIS — Z5971 Insufficient health insurance coverage: Secondary | ICD-10-CM

## 2022-03-24 DIAGNOSIS — Z5989 Other problems related to housing and economic circumstances: Secondary | ICD-10-CM

## 2022-03-24 LAB — POCT GLYCOSYLATED HEMOGLOBIN (HGB A1C): Hemoglobin A1C: 5.2 % (ref 4.0–5.6)

## 2022-03-24 NOTE — Patient Instructions (Signed)
1. Type 2 diabetes mellitus with chronic painful diabetic neuropathy (HCC)  - POCT glycosylated hemoglobin (Hb A1C)    Follow up:  Follow up in 3 months

## 2022-03-24 NOTE — Assessment & Plan Note (Signed)
-   POCT glycosylated hemoglobin (Hb A1C)    Follow up:  Follow up in 3 months

## 2022-03-24 NOTE — Progress Notes (Signed)
$'@Patient'F$  ID: Sharon Hunt, female    DOB: [birth year], 52 y.o.   MRN: [id number]  Chief Complaint  Patient presents with   Follow-up    Referring provider: Argentina Donovan, PA-C   HPI  52 year old female with history of hypertension, GERD, GI bleed, diabetes, neuropathy, alcohol abuse, and tobacco abuse.   Patient presents today for follow-up visit.  Her blood pressure was elevated in office today.  Patient admits that she did not take her medications.  She does have them with her and will take them in office today.  On repeat blood pressure the blood pressure was still elevated but patient needed to leave.  She was advised to watch blood pressure at home and make sure that she continues to take her medicine.  Patient stated that she did not have food so we did get her food from the food pantry.  Patient also met with Estanislado Emms today social worker. Denies f/c/s, n/v/d, hemoptysis, PND, leg swelling Denies chest pain or edema     Allergies  Allergen Reactions   Coconut Flavor Itching     There is no immunization history on file for this patient.  Past Medical History:  Diagnosis Date   GERD (gastroesophageal reflux disease)    H/O hiatal hernia    Hyperlipidemia    Hypertension    Type 2 diabetes mellitus with chronic painful diabetic neuropathy ([place]) 12/20/2021    Tobacco History: Social History   Tobacco Use  Smoking Status Every Day   Packs/day: 1.00   Types: Cigarettes  Smokeless Tobacco Not on file   Ready to quit: Not Answered Counseling given: Not Answered   Outpatient Encounter Medications as of 03/24/2022  Medication Sig   aluminum-magnesium hydroxide-simethicone (MAALOX) 431-540-08 MG/5ML SUSP Take 30 mLs by mouth in the morning and at bedtime.   amLODipine (NORVASC) 5 MG tablet Take 1 tablet (5 mg total) by mouth daily.   Continuous Blood Gluc Receiver (FREESTYLE LIBRE 14 DAY READER) DEVI Use up to 4 times a day as directed.   Continuous Blood  Gluc Sensor (FREESTYLE LIBRE 3 SENSOR) MISC Place 1 sensor on the skin every 14 days. Use to check glucose continuously   hydrocerin (EUCERIN) CREA Apply 1 application. topically 3 (three) times daily.   hydrOXYzine (ATARAX) 25 MG tablet Take 1 tablet (25 mg total) by mouth 3 (three) times daily as needed for anxiety.   lisinopril (ZESTRIL) 40 MG tablet Take 1 tablet (40 mg total) by mouth daily.   metFORMIN (GLUCOPHAGE-XR) 500 MG 24 hr tablet Take 1 tablet (500 mg total) by mouth daily with breakfast.   Multiple Vitamin (MULTIVITAMIN WITH MINERALS) TABS tablet Take 1 tablet by mouth daily.   nicotine (NICODERM CQ - DOSED IN MG/24 HOURS) 21 mg/24hr patch Place 1 patch (21 mg total) onto the skin daily.   thiamine 100 MG tablet Take 1 tablet (100 mg total) by mouth daily.   folic acid (FOLVITE) 1 MG tablet Take 1 tablet (1 mg total) by mouth daily. (Patient not taking: Reported on 12/20/2021)   gabapentin (NEURONTIN) 300 MG capsule Take 1 capsule (300 mg total) by mouth 3 (three) times daily. (Patient not taking: Reported on 03/24/2022)   No facility-administered encounter medications on file as of 03/24/2022.     Review of Systems  Review of Systems  Constitutional: Negative.   HENT: Negative.    Cardiovascular: Negative.   Gastrointestinal: Negative.   Allergic/Immunologic: Negative.   Neurological: Negative.   Psychiatric/Behavioral: Negative.  Physical Exam  BP (!) 219/102   Ht '5\' 5"'$  (1.651 m)   Wt 154 lb (69.9 kg)   BMI 25.63 kg/m   Wt Readings from Last 5 Encounters:  03/24/22 154 lb (69.9 kg)  01/17/22 152 lb 12.8 oz (69.3 kg)  07/25/21 175 lb (79.4 kg)  01/06/19 185 lb 3 oz (84 kg)  04/04/15 177 lb (80.3 kg)     Physical Exam Vitals and nursing note reviewed.  Constitutional:      General: She is not in acute distress.    Appearance: She is well-developed.  Cardiovascular:     Rate and Rhythm: Normal rate and regular rhythm.  Pulmonary:     Effort:  Pulmonary effort is normal.     Breath sounds: Normal breath sounds.  Neurological:     Mental Status: She is alert and oriented to person, place, and time.      Lab Results:  CBC    Component Value Date/Time   WBC 11.7 (H) 07/30/2021 0149   RBC 3.75 (L) 07/30/2021 0149   HGB 13.5 07/30/2021 0149   HCT 36.0 07/30/2021 0149   HCT 32.1 (L) 01/06/2019 1819   PLT 189 07/30/2021 0149   MCV 96.0 07/30/2021 0149   MCH 36.0 (H) 07/30/2021 0149   MCHC 37.5 (H) 07/30/2021 0149   RDW 15.9 (H) 07/30/2021 0149   LYMPHSABS 2.4 07/30/2021 0149   MONOABS 0.9 07/30/2021 0149   EOSABS 0.2 07/30/2021 0149   BASOSABS 0.1 07/30/2021 0149    BMET    Component Value Date/Time   NA 126 (L) 12/20/2021 1612   K 4.1 12/20/2021 1612   CL 82 (L) 12/20/2021 1612   CO2 21 12/20/2021 1612   GLUCOSE 691 (HH) 12/20/2021 1612   GLUCOSE 121 (H) 07/30/2021 0149   BUN 6 12/20/2021 1612   CREATININE 0.83 12/20/2021 1612   CREATININE 0.75 04/04/2015 1206   CALCIUM 9.5 12/20/2021 1612   GFRNONAA >60 07/30/2021 0149   GFRNONAA >89 04/04/2015 1206   GFRAA >60 01/07/2019 0354   GFRAA >89 04/04/2015 1206     Assessment & Plan:   Type 2 diabetes mellitus with chronic painful diabetic neuropathy (HCC) - POCT glycosylated hemoglobin (Hb A1C)    Follow up:  Follow up in 3 months     Fenton Foy, NP 03/24/2022

## 2022-03-24 NOTE — Progress Notes (Signed)
Integrated Behavioral Health General Follow Up Note  03/24/2022 Name: AYERIM BERQUIST MRN: [id number] DOB: 04/02/1970 AISHIA BARKEY is a 52 y.o. year old female who sees Fenton Foy, NP for primary care. LCSW was initially consulted to assist the patient with [employer]  and Financial Difficulties related to lack of income and lack of health coverage.  Interpreter: No.   Interpreter Name & Language: none  Assessment: Patient experiencing financial difficulties related to lack of health coverage and income.  Ongoing Intervention: Met with patient at the Patient [facility]). Patient experiencing significant anxiety today related to coming to the doctors office. She had appointment with PCP today as well. Blood pressure was elevated and patient tearful.   Supportive counseling provided around patient's stress related to coming to the doctor and related to the change in her abilities - she is experiencing memory loss and stuttering. She has a neurology appointment in early February. Advised patient again that she is likely eligible for Medicaid now; patient reported her daughter can take her to social services to apply. Also practiced neuro-respiratory integration exercise for nervous system regulation and provided some education on physiological effects of anxiety. Will plan to follow up with patient by phone later this week to further discuss counseling options.   Review of patient status, including review of consultants reports, relevant laboratory and other test results, and collaboration with appropriate care team members and the patient's provider was performed as part of comprehensive patient evaluation and provision of services.    Estanislado Emms, [facility] [phone]

## 2022-03-27 ENCOUNTER — Telehealth: Payer: Self-pay | Admitting: Clinical

## 2022-03-27 NOTE — Telephone Encounter (Signed)
Integrated Behavioral Health General Follow Up Note  03/27/2022 Name: Sharon Hunt MRN: [id number] DOB: 27-Nov-1970 Sharon Hunt is a 52 y.o. year old female who sees Fenton Foy, NP for primary care. LCSW was initially consulted to assist the patient with [employer]  and Financial Difficulties related to lack of income and lack of health coverage.  Interpreter: No.   Interpreter Name & Language: none  Assessment: Patient experiencing financial difficulties related to lack of health coverage and income.  Ongoing Intervention: Called patient to check in after PCP visit earlier this week. Discussed medication affordability. Patient was previously approved for [facility] but did not start using the free pharmacy program yet. Coordinated with PCP to send prescriptions to [facility].   Patient would also like to apply for a handicap placard for her car. CSW to assist with this.  Review of patient status, including review of consultants reports, relevant laboratory and other test results, and collaboration with appropriate care team members and the patient's provider was performed as part of comprehensive patient evaluation and provision of services.    Estanislado Emms, [facility] [phone]

## 2022-03-31 ENCOUNTER — Telehealth: Payer: Self-pay | Admitting: Clinical

## 2022-03-31 NOTE — Telephone Encounter (Signed)
Please send any of patient's medications to new pharmacy - [facility] (free pharmacy program).

## 2022-04-01 ENCOUNTER — Ambulatory Visit: Payer: Self-pay | Admitting: Dietician

## 2022-04-02 ENCOUNTER — Other Ambulatory Visit: Payer: Self-pay | Admitting: Nurse Practitioner

## 2022-04-02 DIAGNOSIS — E1140 Type 2 diabetes mellitus with diabetic neuropathy, unspecified: Secondary | ICD-10-CM

## 2022-04-02 DIAGNOSIS — I10 Essential (primary) hypertension: Secondary | ICD-10-CM

## 2022-04-02 MED ORDER — HYDROXYZINE HCL 25 MG PO TABS: 25.0000 mg | ORAL_TABLET | Freq: Three times a day (TID) | ORAL | 0 refills | Status: DC | PRN

## 2022-04-02 MED ORDER — AMLODIPINE BESYLATE 5 MG PO TABS: 5.0000 mg | ORAL_TABLET | Freq: Every day | ORAL | 0 refills | Status: DC

## 2022-04-02 MED ORDER — THIAMINE HCL 100 MG PO TABS: 100.0000 mg | ORAL_TABLET | Freq: Every day | ORAL | 0 refills | Status: DC

## 2022-04-02 MED ORDER — ADULT MULTIVITAMIN W/MINERALS CH: 1.0000 | ORAL_TABLET | Freq: Every day | ORAL | 0 refills | Status: DC

## 2022-04-02 MED ORDER — LISINOPRIL 40 MG PO TABS: 40.0000 mg | ORAL_TABLET | Freq: Every day | ORAL | 1 refills | Status: DC

## 2022-04-02 MED ORDER — METFORMIN HCL ER 500 MG PO TB24: 500.0000 mg | ORAL_TABLET | Freq: Every day | ORAL | 0 refills | Status: DC

## 2022-04-02 MED ORDER — NICOTINE 21 MG/24HR TD PT24: 21.0000 mg | MEDICATED_PATCH | Freq: Every day | TRANSDERMAL | 0 refills | Status: DC

## 2022-04-03 ENCOUNTER — Other Ambulatory Visit: Payer: Self-pay | Admitting: Nurse Practitioner

## 2022-04-03 ENCOUNTER — Other Ambulatory Visit: Payer: Self-pay | Admitting: Pharmacist

## 2022-04-03 ENCOUNTER — Telehealth: Payer: Self-pay | Admitting: Clinical

## 2022-04-03 DIAGNOSIS — I10 Essential (primary) hypertension: Secondary | ICD-10-CM

## 2022-04-03 DIAGNOSIS — E1140 Type 2 diabetes mellitus with diabetic neuropathy, unspecified: Secondary | ICD-10-CM

## 2022-04-03 MED ORDER — NICOTINE 21 MG/24HR TD PT24: 21.0000 mg | MEDICATED_PATCH | Freq: Every day | TRANSDERMAL | 0 refills | Status: DC

## 2022-04-03 MED ORDER — METFORMIN HCL ER 500 MG PO TB24: 500.0000 mg | ORAL_TABLET | Freq: Every day | ORAL | 0 refills | Status: DC

## 2022-04-03 MED ORDER — ADULT MULTIVITAMIN W/MINERALS CH: 1.0000 | ORAL_TABLET | Freq: Every day | ORAL | 0 refills | Status: AC

## 2022-04-03 MED ORDER — LISINOPRIL 40 MG PO TABS: 40.0000 mg | ORAL_TABLET | Freq: Every day | ORAL | 1 refills | Status: DC

## 2022-04-03 MED ORDER — AMLODIPINE BESYLATE 5 MG PO TABS: 5.0000 mg | ORAL_TABLET | Freq: Every day | ORAL | 0 refills | Status: DC

## 2022-04-03 MED ORDER — HYDROXYZINE HCL 25 MG PO TABS: 25.0000 mg | ORAL_TABLET | Freq: Three times a day (TID) | ORAL | 0 refills | Status: DC | PRN

## 2022-04-03 NOTE — Progress Notes (Signed)
Care Coordination Call  Contacted patient due to needing to reschedule follow up on 1/29. Reviewed with patient that she received an email that her medication order from [facility] as shipped.   Reports she has purchased a wrist BP monitor. Reports 114/69; 127/84 116/81, 116/85. BP was elevated at last visit with PCP but patient reports she was having a panic attack at the time, and also had not taken her medications prior to the appointment.   Rescheduled follow up with patient in ~ 3-4 weeks.   Catie Hedwig Morton, PharmD, [place], [facility] [phone]

## 2022-04-03 NOTE — Telephone Encounter (Signed)
Integrated Behavioral Health General Follow Up Note  04/03/2022 Name: LUS KRIEGEL MRN: [id number] DOB: [birth year] MCKAYLAH BETTENDORF is a 52 y.o. year old female who sees Fenton Foy, NP for primary care. LCSW was initially consulted to assist the patient with [employer]  and Financial Difficulties related to lack of income and lack of health coverage.  Interpreter: No.   Interpreter Name & Language: none  Assessment: Patient experiencing financial difficulties related to lack of health coverage and income.  Ongoing Intervention: Called patient to advise that her PCP completed the handicap placard paperwork. Patient to pick it up from office tomorrow.  Review of patient status, including review of consultants reports, relevant laboratory and other test results, and collaboration with appropriate care team members and the patient's provider was performed as part of comprehensive patient evaluation and provision of services.    Estanislado Emms, [facility] [phone]

## 2022-04-14 ENCOUNTER — Other Ambulatory Visit: Payer: Self-pay | Admitting: Pharmacist

## 2022-04-22 ENCOUNTER — Encounter: Payer: Self-pay | Admitting: Diagnostic Neuroimaging

## 2022-04-22 ENCOUNTER — Ambulatory Visit (INDEPENDENT_AMBULATORY_CARE_PROVIDER_SITE_OTHER): Payer: Self-pay | Admitting: Diagnostic Neuroimaging

## 2022-04-22 VITALS — BP 164/99 | HR 94 | Ht 65.0 in | Wt 155.0 lb

## 2022-04-22 DIAGNOSIS — E1140 Type 2 diabetes mellitus with diabetic neuropathy, unspecified: Secondary | ICD-10-CM

## 2022-04-22 DIAGNOSIS — G621 Alcoholic polyneuropathy: Secondary | ICD-10-CM

## 2022-04-22 NOTE — Progress Notes (Signed)
[facility]  PATIENT: Sharon Hunt DOB: November 25, 1970  REFERRING CLINICIAN: Fenton Foy, NP HISTORY FROM: patient and daughter REASON FOR VISIT: new consult   HISTORICAL  CHIEF COMPLAINT:  Chief Complaint  Patient presents with   Numbness    Rm 6 with daughter Ronie Spies Pt is well, reports she has been having burning, tingling, numbness, pins and needles in LE and hands for years. Symptoms have recently worsen     HISTORY OF PRESENT ILLNESS:   52 year old female here for evaluation of neuropathy.  Symptoms started around 2016.  At that time she was drinking heavily more than a pint of liquor per day.  She decided to quit cold [place].  Around that time she noticed numbness, tingling, burning sensation in her toes feet and ankles.  Symptoms worsened over time.  1 year ago she was diagnosed diabetes.  A1c was greater than 12.  Fortunately A1c has improved and now is less than 6.  Unfortunately neuropathy symptoms continue.  Still having burning stinging pain in her feet, calves, hands.  Has tried gabapentin 300 mg 3 times a day without relief.  Symptoms worsened later in the day.  She has cut down alcohol but still drinks 2-3 drinks per day.  Has been to rehab in the past around 2021.  Does not have insurance currently but is looking to get this soon.   REVIEW OF SYSTEMS: Full 14 system review of systems performed and negative with exception of: as per HPI.  ALLERGIES: Allergies  Allergen Reactions   Coconut Flavor Itching    HOME MEDICATIONS: Outpatient Medications Prior to Visit  Medication Sig Dispense Refill   aluminum-magnesium hydroxide-simethicone (MAALOX) 595-638-75 MG/5ML SUSP Take 30 mLs by mouth in the morning and at bedtime.     amLODipine (NORVASC) 5 MG tablet Take 1 tablet (5 mg total) by mouth daily. 30 tablet 0   Continuous Blood Gluc Receiver (FREESTYLE LIBRE 14 DAY READER) DEVI Use up to 4 times a day as directed. 1 each 11   Continuous  Blood Gluc Sensor (FREESTYLE LIBRE 3 SENSOR) MISC Place 1 sensor on the skin every 14 days. Use to check glucose continuously 2 each 11   folic acid (FOLVITE) 1 MG tablet Take 1 tablet (1 mg total) by mouth daily. 30 tablet 1   gabapentin (NEURONTIN) 300 MG capsule Take 1 capsule (300 mg total) by mouth 3 (three) times daily. 90 capsule 3   hydrocerin (EUCERIN) CREA Apply 1 application. topically 3 (three) times daily. 454 g 1   hydrOXYzine (ATARAX) 25 MG tablet Take 1 tablet (25 mg total) by mouth 3 (three) times daily as needed for anxiety. 20 tablet 0   lisinopril (ZESTRIL) 40 MG tablet Take 1 tablet (40 mg total) by mouth daily. 30 tablet 1   metFORMIN (GLUCOPHAGE-XR) 500 MG 24 hr tablet Take 1 tablet (500 mg total) by mouth daily with breakfast. 30 tablet 0   Multiple Vitamin (MULTIVITAMIN WITH MINERALS) TABS tablet Take 1 tablet by mouth daily. 30 tablet 0   nicotine (NICODERM CQ - DOSED IN MG/24 HOURS) 21 mg/24hr patch Place 1 patch (21 mg total) onto the skin daily. 28 patch 0   thiamine (VITAMIN B1) 100 MG tablet Take 1 tablet (100 mg total) by mouth daily. 30 tablet 0   No facility-administered medications prior to visit.    PAST MEDICAL HISTORY: Past Medical History:  Diagnosis Date   GERD (gastroesophageal reflux disease)    H/O hiatal hernia  Hyperlipidemia    Hypertension    Type 2 diabetes mellitus with chronic painful diabetic neuropathy ([place]) 12/20/2021    PAST SURGICAL HISTORY: Past Surgical History:  Procedure Laterality Date   BIOPSY  07/27/2021   Procedure: BIOPSY;  Surgeon: Doran Stabler, MD;  [facility];  Service: Gastroenterology;;   ESOPHAGOGASTRODUODENOSCOPY (EGD) WITH PROPOFOL N/A 07/27/2021   Procedure: ESOPHAGOGASTRODUODENOSCOPY (EGD) WITH PROPOFOL;  Surgeon: Doran Stabler, MD;  Location: [place];  Service: Gastroenterology;  Laterality: N/A;   KNEE SURGERY     TONSILLECTOMY     TUBAL LIGATION      FAMILY HISTORY: Family  History  Problem Relation Age of Onset   Hypertension Father    Diabetes Mellitus II Father    Glaucoma Father     SOCIAL HISTORY: Social History   Socioeconomic History   Marital status: Single    Spouse name: Not on file   Number of children: 3   Years of education: Not on file   Highest education level: Not on file  Occupational History   Not on file  Tobacco Use   Smoking status: Every Day    Packs/day: 1.00    Types: Cigarettes   Smokeless tobacco: Not on file  Substance and Sexual Activity   Alcohol use: Yes    Alcohol/week: 4.0 standard drinks of alcohol    Types: 4 Cans of beer per week    Comment: social   Drug use: No   Sexual activity: Not Currently  Other Topics Concern   Not on file  Social History Narrative   Lives with her son.    Social Determinants of Health   Financial Resource Strain: Not on file  Food Insecurity: Not on file  Transportation Needs: Not on file  Physical Activity: Not on file  Stress: Not on file  Social Connections: Not on file  Intimate Partner Violence: Not on file     PHYSICAL EXAM  GENERAL EXAM/CONSTITUTIONAL: Vitals:  Vitals:   04/22/22 1141 04/22/22 1145  BP: (!) 169/96 (!) 164/99  Pulse: 90 94  Weight: 155 lb (70.3 kg)   Height: '5\' 5"'$  (1.651 m)    Body mass index is 25.79 kg/m. Wt Readings from Last 3 Encounters:  04/22/22 155 lb (70.3 kg)  03/24/22 154 lb (69.9 kg)  01/17/22 152 lb 12.8 oz (69.3 kg)   Patient is in no distress; well developed, nourished and groomed; neck is supple  CARDIOVASCULAR: Examination of carotid arteries is normal; no carotid bruits Regular rate and rhythm, no murmurs Examination of peripheral vascular system by observation and palpation is normal  EYES: Ophthalmoscopic exam of optic discs and posterior segments is normal; no papilledema or hemorrhages No results found.  MUSCULOSKELETAL: Gait, strength, tone, movements noted in Neurologic exam below  NEUROLOGIC: MENTAL  STATUS:      No data to display         awake, alert, oriented to person, place and time recent and remote memory intact normal attention and concentration language fluent, comprehension intact, naming intact fund of knowledge appropriate  CRANIAL NERVE:  2nd - no papilledema on fundoscopic exam 2nd, 3rd, 4th, 6th - pupils equal and reactive to light, visual fields full to confrontation, extraocular muscles intact, no nystagmus 5th - facial sensation symmetric 7th - facial strength symmetric 8th - hearing intact 9th - palate elevates symmetrically, uvula midline 11th - shoulder shrug symmetric 12th - tongue protrusion midline  MOTOR:  normal bulk and tone, full strength  in the BUE, BLE  SENSORY:  normal and symmetric to light touch, temperature, vibration  COORDINATION:  finger-nose-finger, fine finger movements normal  REFLEXES:  deep tendon reflexes TRACE and symmetric  GAIT/STATION:  narrow based gait     DIAGNOSTIC DATA (LABS, IMAGING, TESTING) - I reviewed patient records, labs, notes, testing and imaging myself where available.  Lab Results  Component Value Date   WBC 11.7 (H) 07/30/2021   HGB 13.5 07/30/2021   HCT 36.0 07/30/2021   MCV 96.0 07/30/2021   PLT 189 07/30/2021      Component Value Date/Time   NA 126 (L) 12/20/2021 1612   K 4.1 12/20/2021 1612   CL 82 (L) 12/20/2021 1612   CO2 21 12/20/2021 1612   GLUCOSE 691 (HH) 12/20/2021 1612   GLUCOSE 121 (H) 07/30/2021 0149   BUN 6 12/20/2021 1612   CREATININE 0.83 12/20/2021 1612   CREATININE 0.75 04/04/2015 1206   CALCIUM 9.5 12/20/2021 1612   PROT 7.5 12/20/2021 1612   ALBUMIN 3.2 (L) 12/20/2021 1612   AST 172 (H) 12/20/2021 1612   ALT 83 (H) 12/20/2021 1612   ALKPHOS 265 (H) 12/20/2021 1612   BILITOT 1.8 (H) 12/20/2021 1612   GFRNONAA >60 07/30/2021 0149   GFRNONAA >89 04/04/2015 1206   GFRAA >60 01/07/2019 0354   GFRAA >89 04/04/2015 1206   Lab Results  Component Value Date    CHOL 219 (H) 04/04/2015   HDL 39 (L) 04/04/2015   LDLCALC 149 (H) 04/04/2015   TRIG 154 (H) 04/04/2015   CHOLHDL 5.6 (H) 04/04/2015   Lab Results  Component Value Date   HGBA1C 5.2 03/24/2022   Lab Results  Component Value Date   VITAMINB12 1,629 (H) 12/20/2021   Lab Results  Component Value Date   TSH 2.778 04/04/2015      ASSESSMENT AND PLAN  52 y.o. year old female here with:   Dx:  1. Alcoholic peripheral neuropathy ([place])   2. Type 2 diabetes mellitus with chronic painful diabetic neuropathy ([facility])     PLAN:  PAINFUL NEUROPATHY (since ~2016; likely related to diabetes and alcohol abuse) - continue diabetes treatments - continue to cut down alcohol; consider psychiatry / psychology evaluation - increase gabapentin to '600mg'$  three times a day; then may increase to '900mg'$  three times a day  - consider duloxetine 30-'60mg'$  daily (for neuropathy pain, anxiety, depression) - consider capsaicin cream, lidocaine patch / cream, alpha-lipoic acid '600mg'$  daily  Return for return to PCP, pending if symptoms worsen or fail to improve.    Penni Bombard, MD 07/22/175, 9:39 PM Certified in Neurology, Neurophysiology and Neuroimaging  [facility] [address] [place], [place] [address] [phone]

## 2022-04-22 NOTE — Patient Instructions (Signed)
NEUROPATHY (since ~2016; likely related to diabetes and alcohol abuse in the past)  - increase gabapentin to '600mg'$  three times a day; then may increase to '900mg'$  three times a day   - consider duloxetine 30-'60mg'$  daily  - consider capsaicin cream, lidocaine patch / cream, alpha-lipoic acid '600mg'$  daily

## 2022-04-23 ENCOUNTER — Telehealth: Payer: Self-pay | Admitting: Clinical

## 2022-04-23 NOTE — Telephone Encounter (Signed)
Integrated Behavioral Health Progress Note  04/23/2022 Name: Sharon Hunt MRN: [id number] DOB: Jun 22, 1970 Sharon Hunt is a 52 y.o. year old female who sees Fenton Foy, NP for primary care. LCSW was initially consulted to assist the patient with [employer]  and Financial Difficulties related to lack of income and lack of health coverage.   Interpreter: No.   Interpreter Name & Language: none  Assessment: Patient experiencing financial difficulties related to lack of health coverage and income.   Ongoing Intervention: Today CSW returned patient's call from when CSW was out of office last week. Patient reported she had gone to neurology appointment yesterday and neuro advised considering duloxetine for pain/anxiety/depression. Patient also in need of some med refills, will send request to PCP. Patient had questions about social security mail; will plan to follow up later this week about this.  Review of patient status, including review of consultants reports, relevant laboratory and other test results, and collaboration with appropriate care team members and the patient's provider was performed as part of comprehensive patient evaluation and provision of services.    Estanislado Emms, [facility] [phone]

## 2022-04-23 NOTE — Telephone Encounter (Signed)
Patient requesting refill of Freestyle Libre sensors, to be sent to [facility] ([address] Please also refill Nicoderm CQ to this pharmacy, as [facility] does not carry it.

## 2022-04-25 NOTE — Telephone Encounter (Signed)
Maudie Mercury can you send these to me as refill requests? Thanks.

## 2022-04-28 ENCOUNTER — Other Ambulatory Visit (HOSPITAL_COMMUNITY): Payer: Self-pay

## 2022-04-29 ENCOUNTER — Other Ambulatory Visit: Payer: Self-pay

## 2022-04-29 ENCOUNTER — Other Ambulatory Visit (HOSPITAL_COMMUNITY): Payer: Self-pay

## 2022-04-29 DIAGNOSIS — E1140 Type 2 diabetes mellitus with diabetic neuropathy, unspecified: Secondary | ICD-10-CM

## 2022-04-29 MED ORDER — NICOTINE 21 MG/24HR TD PT24
21.0000 mg | MEDICATED_PATCH | Freq: Every day | TRANSDERMAL | 0 refills | Status: DC
  Filled 2022-04-29: qty 28, 28d supply, fill #0

## 2022-04-29 MED ORDER — FREESTYLE LIBRE 3 SENSOR MISC
11 refills | Status: DC
  Filled 2022-04-29: qty 2, fill #0
  Filled 2022-05-26: qty 2, 28d supply, fill #0

## 2022-04-29 NOTE — Telephone Encounter (Signed)
Done KH 

## 2022-05-02 ENCOUNTER — Ambulatory Visit: Payer: Self-pay | Admitting: Dietician

## 2022-05-06 ENCOUNTER — Other Ambulatory Visit: Payer: Self-pay | Admitting: Pharmacist

## 2022-05-06 NOTE — Progress Notes (Unsigned)
05/06/2022 Name: Sharon Hunt MRN: [id number] DOB: November 14, 1970  Chief Complaint  Patient presents with   Medication Management   Diabetes   Hypertension   Hyperlipidemia    Sharon Hunt is a 52 y.o. year old female who presented for a telephone visit.   They were referred to the pharmacist by their PCP for assistance in managing diabetes, hypertension, and hyperlipidemia.   Subjective:  Care Team: Primary Care Provider: Fenton Foy, NP ; Next Scheduled Visit: 06/25/22  Medication Access/Adherence  Current Pharmacy:  [facility] [address] [place] [address] Phone: [phone] Fax: [place], [place] - [address] [place] [address] Phone: [phone] Fax: [phone]  [facility], [place], [place] [address] [place] [address] Phone: [phone] Fax: [phone]   Patient reports affordability concerns with their medications: No  Patient reports access/transportation concerns to their pharmacy: No  Patient reports adherence concerns with their medications:  No     Diabetes:  Current medications: metformin XR 500 mg daily  Reports she has some symptoms when her sugars are lower (<80), including sweating, but notes this could also be menopause.  Patient denies hypoglycemic s/sx including dizziness, shakiness, sweating.   Also endorses some concerns with constipation. Reports it "drives her crazy".   Hypertension:  Current medications: lisinopril 40 mg daily, amlodipine 5 mg daily  Patient has a validated, automated, upper arm home BP cuff  Current blood pressure readings readings: checked while we were on the phone  Neuropathy: Current medications: gabapentin 300 mg three times daily - per neurologist, she was to increase gabapentin to 600 mg three times daily and  can be increased to 900 mg three times daily; also noted to consider duloxetine  Patient reports that she feels worsening burning in her feet when she takes gabapentin    Objective:  Lab Results  Component Value Date   HGBA1C 5.2 03/24/2022    Lab Results  Component Value Date   CREATININE 0.83 12/20/2021   BUN 6 12/20/2021   NA 126 (L) 12/20/2021   K 4.1 12/20/2021   CL 82 (L) 12/20/2021   CO2 21 12/20/2021    Lab Results  Component Value Date   CHOL 219 (H) 04/04/2015   HDL 39 (L) 04/04/2015   LDLCALC 149 (H) 04/04/2015   TRIG 154 (H) 04/04/2015   CHOLHDL 5.6 (H) 04/04/2015    Medications Reviewed Today     Reviewed by Osker Mason, RPH-CPP (Pharmacist) on 05/06/22 at [facility] List Status: <None>   Medication Order Taking? Sig Documenting Provider Last Dose Status Informant  aluminum-magnesium hydroxide-simethicone (MAALOX) [id number] MG/5ML SUSP [id number] No Take 30 mLs by mouth in the morning and at bedtime. [provider] Taking Active Self  amLODipine (NORVASC) 5 MG tablet [id number] No Take 1 tablet (5 mg total) by mouth daily. Fenton Foy, NP Taking Active   Continuous Blood Gluc Receiver (FREESTYLE LIBRE 14 DAY READER) DEVI [id number] No Use up to 4 times a day as directed. Tresa Garter, MD Taking Active   Continuous Blood Gluc Sensor (FREESTYLE LIBRE 3 SENSOR) [place] [id number]  Place 1 sensor on the skin every 14 days. Use to check glucose continuously Fenton Foy, NP  Active   folic acid (FOLVITE) 1 MG tablet [id number] No Take 1 tablet (1 mg total)  by mouth daily. Eugenie Filler, MD Taking Active   gabapentin (NEURONTIN) 300 MG capsule [id number] No Take 1 capsule (300 mg total) by mouth 3 (three) times daily. Renee Rival, FNP Taking Active   hydrocerin (EUCERIN) CREA [id number] No Apply 1 application. topically 3 (three) times daily. Eugenie Filler, MD Taking Active   hydrOXYzine (ATARAX) 25 MG tablet [id number] No  Take 1 tablet (25 mg total) by mouth 3 (three) times daily as needed for anxiety. Fenton Foy, NP Taking Active   lisinopril (ZESTRIL) 40 MG tablet [id number] No Take 1 tablet (40 mg total) by mouth daily. Fenton Foy, NP Taking Active   metFORMIN (GLUCOPHAGE-XR) 500 MG 24 hr tablet [id number] No Take 1 tablet (500 mg total) by mouth daily with breakfast. Fenton Foy, NP Taking Expired 05/03/22 2359   Multiple Vitamin (MULTIVITAMIN WITH MINERALS) TABS tablet [id number] No Take 1 tablet by mouth daily. Fenton Foy, NP Taking Active   nicotine (NICODERM CQ - DOSED IN MG/24 HOURS) 21 mg/24hr patch [id number]  Place 1 patch (21 mg total) onto the skin daily. Fenton Foy, NP  Active   thiamine (VITAMIN B1) 100 MG tablet [id number] No Take 1 tablet (100 mg total) by mouth daily. Fenton Foy, NP Taking Active               Assessment/Plan:   Diabetes: - Currently controlled - Given concerns with constipation and lower blood sugar symptoms, discussed holding metformin and seeing if improvement in symptoms. Patient verbalized understanding.  - Recommend to check glucose periodically   Hypertension: - Currently controlled - Recommend to continue current regimen at this time   Neuropathy: - Uncontrolled, patient does not perceive benefit from gabapentin and does not want to increase the dose.  - Will collaborate with provider on duloxetine recommendation.   Follow Up Plan: phone call in 6 weeks  Catie TJodi Mourning, PharmD, [place], [facility] [phone]

## 2022-05-12 ENCOUNTER — Telehealth: Payer: Self-pay | Admitting: Clinical

## 2022-05-12 NOTE — Telephone Encounter (Signed)
Integrated Behavioral Health Progress Note  05/12/2022 Name: Sharon Hunt MRN: [id number] DOB: 1970-11-06 Sharon Hunt is a 52 y.o. year old female who sees Fenton Foy, NP for primary care. LCSW was initially consulted to assist the patient with [employer]  and Financial Difficulties related to lack of income and lack of health coverage.   Interpreter: No.   Interpreter Name & Language: none  Assessment: Patient experiencing financial difficulties related to lack of health coverage and income.   Ongoing Intervention: CSW called patient to follow up on request for assistance understanding paperwork from social security administration and [facility]. Patient read CSW portions of the letters; advised patient that these seem to be confirmations that patient's disability claim was filed with assistance from the [facility].  Patient's rent at her current apartment is paid up until June 1 of this year. She worries about how she will cover rent if not approved for disability by then; she is concerned about homelessness and does not seem to have family or friends she could stay with temporarily. CSW to continue to follow.  Review of patient status, including review of consultants reports, relevant laboratory and other test results, and collaboration with appropriate care team members and the patient's provider was performed as part of comprehensive patient evaluation and provision of services.    Estanislado Emms, [facility] [phone]

## 2022-05-13 ENCOUNTER — Other Ambulatory Visit (HOSPITAL_COMMUNITY): Payer: Self-pay

## 2022-05-26 ENCOUNTER — Other Ambulatory Visit (HOSPITAL_COMMUNITY): Payer: Self-pay

## 2022-05-26 ENCOUNTER — Other Ambulatory Visit: Payer: Self-pay

## 2022-06-03 ENCOUNTER — Telehealth: Payer: Self-pay | Admitting: Clinical

## 2022-06-03 NOTE — Telephone Encounter (Signed)
Integrated Behavioral Health Progress Note  06/03/2022 Name: Sharon Hunt MRN: [id number] DOB: 07-08-1970 Sharon Hunt is a 52 y.o. year old female who sees Fenton Foy, NP for primary care. LCSW was initially consulted to assist the patient with [employer]  and Financial Difficulties related to lack of income and lack of health coverage.   Interpreter: No.   Interpreter Name & Language: none  Assessment: Patient experiencing financial difficulties related to lack of health coverage and income.   Ongoing Intervention: Patient and CSW spoke over the phone. Patient had questions about some recent letters she has received from social security about her disability claim. Emailed Lubertha Sayres at the [employer] about this.   Patient also had concerns about her blood sugar readings. She would like to talk to the pharmacist about this. Referred to [employer].  Review of patient status, including review of consultants reports, relevant laboratory and other test results, and collaboration with appropriate care team members and the patient's provider was performed as part of comprehensive patient evaluation and provision of services.    Estanislado Emms, [facility] [phone]

## 2022-06-04 ENCOUNTER — Encounter: Payer: Self-pay | Admitting: Nurse Practitioner

## 2022-06-04 NOTE — Assessment & Plan Note (Signed)
-   metFORMIN (GLUCOPHAGE-XR) 500 MG 24 hr tablet; Take 1 tablet (500 mg total) by mouth daily with breakfast.  Dispense: 30 tablet; Refill: 0 - Ambulatory referral to diabetic education  - eat 6 small meals throughout the day - high protein / low carb   Follow up:  Follow up in 1 month or sooner if needed

## 2022-06-05 ENCOUNTER — Other Ambulatory Visit: Payer: Self-pay | Admitting: Pharmacist

## 2022-06-05 NOTE — Progress Notes (Signed)
Care Coordination Call  Received message from Estanislado Emms that patient has concerns about readings being lower than 70. She discontinued metformin as we previously discussed. Still has periodic overnight readings in the 60s, does not feel poorly but [place] alarm wakes her up.   Discussed how to cut off the low <70 alarm, cannot cut off the low <54 alarm. Discussed that these are normal readings given her last A1c; discussed that CGM alarms were built for patients on insulin with greater risks of hypoglycemia and negative complications. She verbalizes understanding. If she still has readings that set off her alarm over the next 2 weeks, we'll discuss discontinuing CGM.   Catie Hedwig Morton, PharmD, [place], [facility] [phone]

## 2022-06-17 ENCOUNTER — Other Ambulatory Visit: Payer: Self-pay | Admitting: Pharmacist

## 2022-06-17 NOTE — Progress Notes (Signed)
06/17/2022 Name: Sharon Hunt MRN: [id number] DOB: 07-21-1970  Chief Complaint  Patient presents with   Medication Management   Diabetes    Sharon Hunt is a 52 y.o. year old female who presented for a telephone visit.   They were referred to the pharmacist by their PCP for assistance in managing hypertension.    Subjective:  Care Team: Primary Care Provider: Fenton Foy, NP ; Next Scheduled Visit: 4/10/2  Medication Access/Adherence  Current Pharmacy:  [facility] [address] [place] [address] Phone: [phone] Fax: [phone]  [facility], [place], [place] [address], [place] [address] Phone: [phone] Fax: [phone]   Patient reports affordability concerns with their medications: No  Patient reports access/transportation concerns to their pharmacy: No  Patient reports adherence concerns with their medications:  No     Diabetes:  Current medications: metformin XR 500 mg daily - patient notes she restarted, prefers to remain on therapy   CGM readings remain at goal 100% of the time  Patient denies hypoglycemic s/sx including dizziness, shakiness, sweating. Patient denies hyperglycemic symptoms including polyuria, polydipsia, polyphagia, nocturia, neuropathy, blurred vision.  Now that we turned off the <70 alarm, she is not being woken up overnight by alarms; however, she is still wondering if she can stop using CGM since her son is paying out of pocket for it  Hypertension:  Current medications: amlodipine 5 mg daily, lisinopril 40 mg daily    Patient has a validated, automated, upper arm home BP cuff Current blood pressure readings readings: controlled 110s/70s at home  Patient denies hypotensive s/sx including dizziness, lightheadedness.  Patient denies hypertensive symptoms including headache, chest pain, shortness of  breath   Hyperlipidemia/ASCVD Risk Reduction  Current lipid lowering medications: none Medications tried in the past: atorvastatin; no noted reason for discontinuation  PREVENT Risk Score: 10 year risk of CVD: 7.2% - 10 year risk of ASCVD: 4.2% - 10 year risk of HF: 2.9%   Objective:  Lab Results  Component Value Date   HGBA1C 5.2 03/24/2022    Lab Results  Component Value Date   CREATININE 0.83 12/20/2021   BUN 6 12/20/2021   NA 126 (L) 12/20/2021   K 4.1 12/20/2021   CL 82 (L) 12/20/2021   CO2 21 12/20/2021    Lab Results  Component Value Date   CHOL 219 (H) 04/04/2015   HDL 39 (L) 04/04/2015   LDLCALC 149 (H) 04/04/2015   TRIG 154 (H) 04/04/2015   CHOLHDL 5.6 (H) 04/04/2015    Medications Reviewed Today     Reviewed by Osker Mason, RPH-CPP (Pharmacist) on 06/17/22 at 1416  Med List Status: <None>   Medication Order Taking? Sig Documenting Provider Last Dose Status Informant  aluminum-magnesium hydroxide-simethicone (MAALOX) I037812 MG/5ML SUSP [id number]  Take 30 mLs by mouth in the morning and at bedtime. [provider]  Active Self  amLODipine (NORVASC) 5 MG tablet [id number] Yes Take 1 tablet (5 mg total) by mouth daily. Fenton Foy, NP Taking Active   folic acid (FOLVITE) 1 MG tablet [id number]  Take 1 tablet (1 mg total) by mouth daily. Eugenie Filler, MD  Active   gabapentin (NEURONTIN) 300 MG capsule [id number] Yes Take 1 capsule (300 mg total) by mouth 3 (three) times daily. Renee Rival, FNP Taking Active   hydrocerin (EUCERIN) CREA [id number]  Apply 1 application. topically  3 (three) times daily. Eugenie Filler, MD  Active   hydrOXYzine (ATARAX) 25 MG tablet [id number] Yes Take 1 tablet (25 mg total) by mouth 3 (three) times daily as needed for anxiety. Fenton Foy, NP Taking Active   lisinopril (ZESTRIL) 40 MG tablet [id number] Yes Take 1 tablet (40 mg total) by mouth daily. Fenton Foy, NP Taking Active    metFORMIN (GLUCOPHAGE-XR) 500 MG 24 hr tablet [id number] Yes Take 500 mg by mouth daily. [provider] Taking Active   Multiple Vitamin (MULTIVITAMIN WITH MINERALS) TABS tablet [id number]  Take 1 tablet by mouth daily. Fenton Foy, NP  Active   nicotine (NICODERM CQ - DOSED IN MG/24 HOURS) 21 mg/24hr patch [id number]  Place 1 patch (21 mg total) onto the skin daily. Fenton Foy, NP  Active   thiamine (VITAMIN B1) 100 MG tablet [id number]  Take 1 tablet (100 mg total) by mouth daily. Fenton Foy, NP  Active               Assessment/Plan:   Diabetes: - Currently controlled - Agree to discontinue CGM use due to concern with low glucose alarms and patient cost. Recommend to continue to be mindful of carbohydrate portion sizes - Recommend to continue metformin XR 500 mg daily  Hypertension: - Currently controlled - Reviewed long term cardiovascular and renal outcomes of uncontrolled blood pressure - Reviewed appropriate blood pressure monitoring technique and reviewed goal blood pressure. Recommended to check home blood pressure and heart rate daily - Recommend to continue current regimen  Hyperlipidemia/ASCVD Risk Reduction: - Currently uncontrolled.  - Recommend to recheck lipids with next labs and evaluate benefit vs risk of moderate intensity statin.  Follow Up Plan: PCP visit next week  Catie Hedwig Morton, PharmD, [place], [facility] [phone]

## 2022-06-25 ENCOUNTER — Ambulatory Visit: Payer: Self-pay | Admitting: Nurse Practitioner

## 2022-06-25 ENCOUNTER — Telehealth: Payer: Self-pay | Admitting: Clinical

## 2022-06-26 NOTE — Telephone Encounter (Signed)
Integrated Behavioral Health Progress Note  06/25/22 Name: Sharon Hunt MRN: [id number] DOB: [birth year] Sharon Hunt is a 52 y.o. year old female who sees Ivonne Andrew, NP for primary care. LCSW was initially consulted to assist the patient with [employer]  and Financial Difficulties related to lack of income and lack of health coverage.   Interpreter: No.   Interpreter Name & Language: none  Assessment: Patient experiencing financial difficulties related to lack of health coverage and income.   Ongoing Intervention: Patient emailed CSW question about re-sending [employer]. CSW called patient to follow up and advised that patient is likely eligible for Medicaid since she does not have income. Patient reported that she had gone down to the social services office to apply, but was unable to due to her severe anxiety around the busy office. Referred patient to the Medicaid workers who are located at the [facility], advising that it may be less busy and anxiety provoking there.  Patient was also becoming severely anxious about her PCP appointment today. She prefers a morning appointment because it is quieter and less busy at our office in the morning. Rescheduled patient's follow up for a morning appointment. Provided supportive counseling around patient's anxiety. Emotional validation and reflective listening provided.   [name], LCSW [facility] [phone]

## 2022-06-30 ENCOUNTER — Other Ambulatory Visit: Payer: Self-pay

## 2022-07-09 ENCOUNTER — Telehealth: Payer: Self-pay | Admitting: Clinical

## 2022-07-09 ENCOUNTER — Ambulatory Visit (INDEPENDENT_AMBULATORY_CARE_PROVIDER_SITE_OTHER): Payer: Self-pay | Admitting: Nurse Practitioner

## 2022-07-09 ENCOUNTER — Other Ambulatory Visit: Payer: Self-pay | Admitting: Nurse Practitioner

## 2022-07-09 ENCOUNTER — Encounter: Payer: Self-pay | Admitting: Nurse Practitioner

## 2022-07-09 VITALS — BP 160/92 | HR 94 | Temp 97.3°F | Wt 158.2 lb

## 2022-07-09 DIAGNOSIS — N760 Acute vaginitis: Secondary | ICD-10-CM

## 2022-07-09 DIAGNOSIS — Z1211 Encounter for screening for malignant neoplasm of colon: Secondary | ICD-10-CM

## 2022-07-09 DIAGNOSIS — E1140 Type 2 diabetes mellitus with diabetic neuropathy, unspecified: Secondary | ICD-10-CM

## 2022-07-09 DIAGNOSIS — I10 Essential (primary) hypertension: Secondary | ICD-10-CM

## 2022-07-09 DIAGNOSIS — Z13220 Encounter for screening for lipoid disorders: Secondary | ICD-10-CM

## 2022-07-09 DIAGNOSIS — Z1231 Encounter for screening mammogram for malignant neoplasm of breast: Secondary | ICD-10-CM

## 2022-07-09 DIAGNOSIS — L309 Dermatitis, unspecified: Secondary | ICD-10-CM

## 2022-07-09 LAB — POCT GLYCOSYLATED HEMOGLOBIN (HGB A1C): Hemoglobin A1C: 5.2 % (ref 4.0–5.6)

## 2022-07-09 LAB — POCT URINALYSIS DIP (PROADVANTAGE DEVICE)
Glucose, UA: NEGATIVE mg/dL
Leukocytes, UA: NEGATIVE
Nitrite, UA: POSITIVE — AB
Protein Ur, POC: >=300 mg/dL — AB
Specific Gravity, Urine: 1.03
Urobilinogen, Ur: 2
pH, UA: 5.5 (ref 5.0–8.0)

## 2022-07-09 MED ORDER — AMLODIPINE BESYLATE 5 MG PO TABS: 5.0000 mg | ORAL_TABLET | Freq: Every day | ORAL | 0 refills | Status: DC

## 2022-07-09 MED ORDER — BETAMETHASONE DIPROPIONATE AUG 0.05 % EX OINT: TOPICAL_OINTMENT | Freq: Two times a day (BID) | CUTANEOUS | 0 refills | Status: DC

## 2022-07-09 MED ORDER — HYDROXYZINE HCL 25 MG PO TABS: 25.0000 mg | ORAL_TABLET | Freq: Three times a day (TID) | ORAL | 0 refills | Status: DC | PRN

## 2022-07-09 NOTE — Progress Notes (Signed)
  ID: Sharon Hunt, female    DOB: Nov 16, 1970, 52 y.o.   MRN: [id number]  Chief Complaint  Patient presents with   Diabetes    Referring provider: Ivonne Andrew, NP   HPI  52 year old female with history of hypertension, GERD, GI bleed, diabetes, neuropathy, alcohol abuse, and tobacco abuse.    Patient presents today for follow-up visit for hypertension and diabetes.  Her blood pressure was stable in office today. She was advised to watch blood pressure at home and make sure that she continues to take her medicine.  Patient stated that she did not have food so we did get her food from the food pantry.  Diabetes is currently well controlled. A1C is at 5.2 today. Pharmacy is following with her for medication management.  Patient is complaining of vaginal irritation today and irritation while urinating.  Will check UA and send for culture.  Denies f/c/s, n/v/d, hemoptysis, PND, leg swelling Denies chest pain or edema     Allergies  Allergen Reactions   Coconut Flavor Itching     There is no immunization history on file for this patient.  Past Medical History:  Diagnosis Date   GERD (gastroesophageal reflux disease)    H/O hiatal hernia    Hyperlipidemia    Hypertension    Type 2 diabetes mellitus with chronic painful diabetic neuropathy 12/20/2021    Tobacco History: Social History   Tobacco Use  Smoking Status Every Day   Packs/day: 1   Types: Cigarettes  Smokeless Tobacco Not on file   Ready to quit: Not Answered Counseling given: Not Answered   Outpatient Encounter Medications as of 07/09/2022  Medication Sig   aluminum-magnesium hydroxide-simethicone (MAALOX) 200-200-20 MG/5ML SUSP Take 30 mLs by mouth in the morning and at bedtime.   augmented betamethasone dipropionate (DIPROLENE) 0.05 % ointment Apply topically 2 (two) times daily.   gabapentin (NEURONTIN) 300 MG capsule Take 1 capsule (300 mg total) by mouth 3 (three) times daily.    lisinopril (ZESTRIL) 40 MG tablet Take 1 tablet (40 mg total) by mouth daily.   metFORMIN (GLUCOPHAGE-XR) 500 MG 24 hr tablet Take 500 mg by mouth daily.   Multiple Vitamin (MULTIVITAMIN WITH MINERALS) TABS tablet Take 1 tablet by mouth daily.   [DISCONTINUED] amLODipine (NORVASC) 5 MG tablet Take 1 tablet (5 mg total) by mouth daily.   [DISCONTINUED] hydrOXYzine (ATARAX) 25 MG tablet Take 1 tablet (25 mg total) by mouth 3 (three) times daily as needed for anxiety.   amLODipine (NORVASC) 5 MG tablet Take 1 tablet (5 mg total) by mouth daily.   folic acid (FOLVITE) 1 MG tablet Take 1 tablet (1 mg total) by mouth daily. (Patient not taking: Reported on 07/09/2022)   hydrocerin (EUCERIN) CREA Apply 1 application. topically 3 (three) times daily. (Patient not taking: Reported on 07/09/2022)   hydrOXYzine (ATARAX) 25 MG tablet Take 1 tablet (25 mg total) by mouth 3 (three) times daily as needed for anxiety.   nicotine (NICODERM CQ - DOSED IN MG/24 HOURS) 21 mg/24hr patch Place 1 patch (21 mg total) onto the skin daily. (Patient not taking: Reported on 07/09/2022)   thiamine (VITAMIN B1) 100 MG tablet Take 1 tablet (100 mg total) by mouth daily. (Patient not taking: Reported on 07/09/2022)   No facility-administered encounter medications on file as of 07/09/2022.     Review of Systems  Review of Systems  Constitutional: Negative.   HENT: Negative.    Cardiovascular: Negative.   Gastrointestinal: Negative.  Allergic/Immunologic: Negative.   Neurological: Negative.   Psychiatric/Behavioral: Negative.         Physical Exam  BP (!) 160/92   Pulse 94   Temp (!) 97.3 F (36.3 C)   Wt 158 lb 3.2 oz (71.8 kg)   SpO2 100%   BMI 26.33 kg/m   Wt Readings from Last 5 Encounters:  07/09/22 158 lb 3.2 oz (71.8 kg)  04/22/22 155 lb (70.3 kg)  03/24/22 154 lb (69.9 kg)  01/17/22 152 lb 12.8 oz (69.3 kg)  07/25/21 175 lb (79.4 kg)     Physical Exam Vitals and nursing note reviewed.   Constitutional:      General: She is not in acute distress.    Appearance: She is well-developed.  Cardiovascular:     Rate and Rhythm: Normal rate and regular rhythm.  Pulmonary:     Effort: Pulmonary effort is normal.     Breath sounds: Normal breath sounds.  Neurological:     Mental Status: She is alert and oriented to person, place, and time.      Lab Results:  CBC    Component Value Date/Time   WBC 11.7 (H) 07/30/2021 0149   RBC 3.75 (L) 07/30/2021 0149   HGB 13.5 07/30/2021 0149   HCT 36.0 07/30/2021 0149   HCT 32.1 (L) 01/06/2019 1819   PLT 189 07/30/2021 0149   MCV 96.0 07/30/2021 0149   MCH 36.0 (H) 07/30/2021 0149   MCHC 37.5 (H) 07/30/2021 0149   RDW 15.9 (H) 07/30/2021 0149   LYMPHSABS 2.4 07/30/2021 0149   MONOABS 0.9 07/30/2021 0149   EOSABS 0.2 07/30/2021 0149   BASOSABS 0.1 07/30/2021 0149    BMET    Component Value Date/Time   NA 126 (L) 12/20/2021 1612   K 4.1 12/20/2021 1612   CL 82 (L) 12/20/2021 1612   CO2 21 12/20/2021 1612   GLUCOSE 691 (HH) 12/20/2021 1612   GLUCOSE 121 (H) 07/30/2021 0149   BUN 6 12/20/2021 1612   CREATININE 0.83 12/20/2021 1612   CREATININE 0.75 04/04/2015 1206   CALCIUM 9.5 12/20/2021 1612   GFRNONAA >60 07/30/2021 0149   GFRNONAA >89 04/04/2015 1206   GFRAA >60 01/07/2019 0354   GFRAA >89 04/04/2015 1206     Assessment & Plan:   Eczema - augmented betamethasone dipropionate (DIPROLENE) 0.05 % ointment; Apply topically 2 (two) times daily.  Dispense: 30 g; Refill: 0 - hydrOXYzine (ATARAX) 25 MG tablet; Take 1 tablet (25 mg total) by mouth 3 (three) times daily as needed for anxiety.  Dispense: 20 tablet; Refill: 0  2. Primary hypertension  - amLODipine (NORVASC) 5 MG tablet; Take 1 tablet (5 mg total) by mouth daily.  Dispense: 30 tablet; Refill: 0  3. Type 2 diabetes mellitus with chronic painful diabetic neuropathy  - POCT glycosylated hemoglobin (Hb A1C) - Microalbumin/Creatinine Ratio, Urine -  Ambulatory referral to Podiatry - CBC - Comprehensive metabolic panel  4. Colon cancer screening  - Cologuard  5. Encounter for screening mammogram for malignant neoplasm of breast  - MM Digital Screening; Future  6. Lipid screening  - Lipid Panel  Follow up:  Follow up in 6 months     Ivonne Andrew, NP 07/09/2022

## 2022-07-09 NOTE — Patient Instructions (Signed)
1. Eczema, unspecified type  - augmented betamethasone dipropionate (DIPROLENE) 0.05 % ointment; Apply topically 2 (two) times daily.  Dispense: 30 g; Refill: 0 - hydrOXYzine (ATARAX) 25 MG tablet; Take 1 tablet (25 mg total) by mouth 3 (three) times daily as needed for anxiety.  Dispense: 20 tablet; Refill: 0  2. Primary hypertension  - amLODipine (NORVASC) 5 MG tablet; Take 1 tablet (5 mg total) by mouth daily.  Dispense: 30 tablet; Refill: 0  3. Type 2 diabetes mellitus with chronic painful diabetic neuropathy  - POCT glycosylated hemoglobin (Hb A1C) - Microalbumin/Creatinine Ratio, Urine - Ambulatory referral to Podiatry - CBC - Comprehensive metabolic panel  4. Colon cancer screening  - Cologuard  5. Encounter for screening mammogram for malignant neoplasm of breast  - MM Digital Screening; Future  6. Lipid screening  - Lipid Panel  Follow up:  Follow up in 6 months

## 2022-07-09 NOTE — Telephone Encounter (Signed)
Integrated Behavioral Health Progress Note  07/09/22 Name: Sharon Hunt MRN: [id number] DOB: Sep 04, 1970 Sharon Hunt is a 52 y.o. year old female who sees Ivonne Andrew, NP for primary care. LCSW was initially consulted to assist the patient with [employer]  and Financial Difficulties related to lack of income and lack of health coverage.   Interpreter: No.   Interpreter Name & Language: none  Assessment: Patient experiencing financial difficulties related to lack of health coverage and income.   Ongoing Intervention: Called patient to follow up, as she requested a call from CSW when she was here for her PCP visit this morning. Patient reported that her son had received a letter from disability determination services (DDS) about her disability claim. She called DDS about this and they indicated they are missing some of her information. The representative she spoke to indicated they would contact her again to let her know if they would be able to re-send the form they need her to complete. CSW and patient planned to call DDS again together Monday [phone]) if she hasn't heard from them.   [name], LCSW [facility] [phone]

## 2022-07-09 NOTE — Assessment & Plan Note (Signed)
-   augmented betamethasone dipropionate (DIPROLENE) 0.05 % ointment; Apply topically 2 (two) times daily.  Dispense: 30 g; Refill: 0 - hydrOXYzine (ATARAX) 25 MG tablet; Take 1 tablet (25 mg total) by mouth 3 (three) times daily as needed for anxiety.  Dispense: 20 tablet; Refill: 0  2. Primary hypertension  - amLODipine (NORVASC) 5 MG tablet; Take 1 tablet (5 mg total) by mouth daily.  Dispense: 30 tablet; Refill: 0  3. Type 2 diabetes mellitus with chronic painful diabetic neuropathy  - POCT glycosylated hemoglobin (Hb A1C) - Microalbumin/Creatinine Ratio, Urine - Ambulatory referral to Podiatry - CBC - Comprehensive metabolic panel  4. Colon cancer screening  - Cologuard  5. Encounter for screening mammogram for malignant neoplasm of breast  - MM Digital Screening; Future  6. Lipid screening  - Lipid Panel  Follow up:  Follow up in 6 months

## 2022-07-09 NOTE — Telephone Encounter (Signed)
Please advise KH 

## 2022-07-09 NOTE — Telephone Encounter (Signed)
error 

## 2022-07-10 ENCOUNTER — Other Ambulatory Visit (HOSPITAL_COMMUNITY): Payer: Self-pay

## 2022-07-10 ENCOUNTER — Other Ambulatory Visit: Payer: Self-pay | Admitting: Nurse Practitioner

## 2022-07-10 LAB — CBC
Hematocrit: 48.4 % — ABNORMAL HIGH (ref 34.0–46.6)
Hemoglobin: 16.2 g/dL — ABNORMAL HIGH (ref 11.1–15.9)
MCH: 30.9 pg (ref 26.6–33.0)
MCHC: 33.5 g/dL (ref 31.5–35.7)
MCV: 92 fL (ref 79–97)
Platelets: 161 10*3/uL (ref 150–450)
RBC: 5.24 x10E6/uL (ref 3.77–5.28)
RDW: 15.5 % — ABNORMAL HIGH (ref 11.7–15.4)
WBC: 10.3 10*3/uL (ref 3.4–10.8)

## 2022-07-10 LAB — COMPREHENSIVE METABOLIC PANEL
ALT: 30 IU/L (ref 0–32)
AST: 52 IU/L — ABNORMAL HIGH (ref 0–40)
Albumin/Globulin Ratio: 1.7 (ref 1.2–2.2)
Albumin: 5.2 g/dL — ABNORMAL HIGH (ref 3.8–4.9)
Alkaline Phosphatase: 137 IU/L — ABNORMAL HIGH (ref 44–121)
BUN/Creatinine Ratio: 15 (ref 9–23)
BUN: 13 mg/dL (ref 6–24)
Bilirubin Total: 1.3 mg/dL — ABNORMAL HIGH (ref 0.0–1.2)
CO2: 23 mmol/L (ref 20–29)
Calcium: 11.1 mg/dL — ABNORMAL HIGH (ref 8.7–10.2)
Chloride: 97 mmol/L (ref 96–106)
Creatinine, Ser: 0.85 mg/dL (ref 0.57–1.00)
Globulin, Total: 3 g/dL (ref 1.5–4.5)
Glucose: 105 mg/dL — ABNORMAL HIGH (ref 70–99)
Potassium: 4 mmol/L (ref 3.5–5.2)
Sodium: 140 mmol/L (ref 134–144)
Total Protein: 8.2 g/dL (ref 6.0–8.5)
eGFR: 82 mL/min/{1.73_m2} (ref >59)

## 2022-07-10 LAB — LIPID PANEL
Chol/HDL Ratio: 2.6 ratio (ref 0.0–4.4)
Cholesterol, Total: 318 mg/dL — ABNORMAL HIGH (ref 100–199)
HDL: 123 mg/dL (ref >39)
LDL Chol Calc (NIH): 179 mg/dL — ABNORMAL HIGH (ref 0–99)
Triglycerides: 101 mg/dL (ref 0–149)
VLDL Cholesterol Cal: 16 mg/dL (ref 5–40)

## 2022-07-10 LAB — MICROALBUMIN / CREATININE URINE RATIO
Creatinine, Urine: 389.8 mg/dL
Microalb/Creat Ratio: 309 mg/g creat — ABNORMAL HIGH (ref 0–29)
Microalbumin, Urine: 1203.8 ug/mL

## 2022-07-10 MED ORDER — ROSUVASTATIN CALCIUM 20 MG PO TABS
20.0000 mg | ORAL_TABLET | Freq: Every day | ORAL | 11 refills | Status: DC
  Filled 2022-07-10 – 2022-09-05 (×2): qty 30, 30d supply, fill #0

## 2022-07-14 ENCOUNTER — Telehealth: Payer: Self-pay | Admitting: Clinical

## 2022-07-14 NOTE — Progress Notes (Signed)
Called pt and inform results.Gh 

## 2022-07-14 NOTE — Telephone Encounter (Signed)
Integrated Behavioral Health Progress Note  07/14/2022 Name: Sharon Hunt MRN: [id number] DOB: 13-Feb-1971 Sharon Hunt is a 52 y.o. year old female who sees Ivonne Andrew, NP for primary care. LCSW was initially consulted to assist the patient with [employer]  and Financial Difficulties related to lack of income and lack of health coverage.   Interpreter: No.   Interpreter Name & Language: none  Assessment: Patient experiencing financial difficulties related to lack of health coverage and income.    Ongoing Intervention: CSW called patient to follow up on patient's issues with disability determination services (DDS). They had informed her that they were missing some information for her disability claim. Tried calling DDS together with patient and we were unable to get through to a representative.   [facility], who has been assisting patient with her claim. Spoke to [place] indicated she will try to follow up with DDS on this issue. She discussed with patient which forms patient has returned to DDS - patient has likely already sent back function report and work history report. DDS claims they are still missing information. CSW to follow.  [name], LCSW [facility] [phone]

## 2022-07-21 ENCOUNTER — Other Ambulatory Visit (HOSPITAL_COMMUNITY): Payer: Self-pay

## 2022-07-21 ENCOUNTER — Ambulatory Visit: Payer: Self-pay | Admitting: Podiatry

## 2022-08-26 ENCOUNTER — Ambulatory Visit: Payer: Self-pay

## 2022-08-28 ENCOUNTER — Telehealth: Payer: Self-pay | Admitting: Clinical
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
## 2022-08-29 ENCOUNTER — Telehealth: Payer: Self-pay | Admitting: Clinical

## 2022-08-29 NOTE — Telephone Encounter (Signed)
Integrated Behavioral Health Progress Note  08/29/2022 Name: [name] MRN: [id number] DOB: 1970/12/22 Sharon Hunt is a 52 y.o. year old female who sees Ivonne Andrew, NP for primary care. LCSW was initially consulted to assist the patient with [employer]  and Financial Difficulties related to lack of income and lack of health coverage.   Interpreter: No.   Interpreter Name & Language: none  Assessment: Patient experiencing financial difficulties related to lack of health coverage and income.    Ongoing Intervention: Followed up with patient today as she was scheduled to go over to [facility] and meet with Medicaid workers there. Patient reported she did not go due to having a panic attack. Patient's severe anxiety is evident on call today. CSW checking with Medicaid workers at [facility] [employer] to see if there is a phone/virtual option for patient to apply.   Patient has been very confused and frustrated with the social security disability application process, and this has in fact exacerbated her anxiety even more severely. She does not know what the next steps are, as the last she has heard from DDS is that they were unable to get her medical records. CSW did receive a call from [facility] today after emailing them yesterday. They did indicate that they received a letter stating that patient has an appointment for an examination by a physician with disability, which means her claim is moving along. CSW advised patient of this scheduled appointment and all the details.   SDOH Screenings   Food Insecurity: Food Insecurity Present (07/08/2022)  Housing: Medium Risk (07/08/2022)  Transportation Needs: Unmet Transportation Needs (07/08/2022)  Alcohol Screen: High Risk (07/08/2022)  Depression (PHQ2-9): High Risk (07/09/2022)  Financial Resource Strain: High Risk (07/08/2022)  Physical Activity: Unknown (07/08/2022)  Social Connections: Socially Isolated (07/08/2022)   Stress: Stress Concern Present (07/08/2022)  Tobacco Use: High Risk (07/09/2022)     [name], LCSW [facility] [phone]

## 2022-09-02 ENCOUNTER — Telehealth: Payer: Self-pay | Admitting: Clinical

## 2022-09-02 NOTE — Telephone Encounter (Addendum)
Integrated Behavioral Health Progress Note  09/02/2022 Name: Sharon Hunt MRN: [id number] DOB: 1971-03-08 Sharon Hunt is a 52 y.o. year old female who sees Ivonne Andrew, NP for primary care. LCSW was initially consulted to assist the patient with [employer]  and Financial Difficulties related to lack of income and lack of health coverage.   Interpreter: No.   Interpreter Name & Language: none  Assessment: Patient experiencing financial difficulties related to lack of health coverage and income.    Ongoing Intervention: CSW received another message from [facility], advising that patient has a psychiatric evaluation for disability in late July. Also received confirmation from [facility] worker at [facility] center that they can help patient apply for Medicaid over the phone. Called patient and advised her of this. Patient gave permission for Medicaid worker to call her. Gave patient information on the psychiatric evaluation in late July and reminded her of the medical evaluation on July 3.   [name], LCSW [facility] [phone]

## 2022-09-05 ENCOUNTER — Other Ambulatory Visit: Payer: Self-pay | Admitting: Nurse Practitioner

## 2022-09-05 ENCOUNTER — Other Ambulatory Visit (HOSPITAL_COMMUNITY): Payer: Self-pay

## 2022-09-05 DIAGNOSIS — E1140 Type 2 diabetes mellitus with diabetic neuropathy, unspecified: Secondary | ICD-10-CM

## 2022-09-05 MED ORDER — FREESTYLE LIBRE 3 SENSOR MISC
11 refills | Status: DC
Start: 2022-09-05 — End: 2023-02-17
  Filled 2022-09-05 (×2): qty 2, 28d supply, fill #0

## 2022-09-06 ENCOUNTER — Other Ambulatory Visit (HOSPITAL_COMMUNITY): Payer: Self-pay

## 2022-09-08 ENCOUNTER — Other Ambulatory Visit: Payer: Self-pay

## 2022-09-08 ENCOUNTER — Telehealth: Payer: Self-pay | Admitting: Clinical

## 2022-09-08 ENCOUNTER — Other Ambulatory Visit (HOSPITAL_COMMUNITY): Payer: Self-pay

## 2022-09-08 NOTE — Telephone Encounter (Signed)
Integrated Behavioral Health Progress Note  09/08/2022 Name: MAURICA OMURA MRN: [id number] DOB: [birth year] JENNY LAI is a 52 y.o. year old female who sees Ivonne Andrew, NP for primary care. LCSW was initially consulted to assist the patient with [employer]  and Financial Difficulties related to lack of income and lack of health coverage.   Interpreter: No.   Interpreter Name & Language: none  Assessment: Patient experiencing financial difficulties related to lack of health coverage and income.    Ongoing Intervention: Cathlyn Parsons, Medicaid worker at [employer], contacted CSW and advised that patient is unable to get a medication with her new Medicaid coverage. CSW called patient to follow up. Patient reported she wasn't able to get her Freestyle [employer] supplies, stating that pharmacy said it's because she takes metformin and isn't on insulin, and that's why not covered by Medicaid. Checked with pharmacist Catie Clearance Coots; she advised that patient's A1C has been very good and patient does not need to continue paying out of pocket for Freestyle.  Patient reported foot pain. She was referred for podiatry a few months ago but podiatry office was unable to reach her. Patient reports she didn't go due to lack of insurance at the time. CSW consulting with referral coordinator for new podiatry referral now that patient has Medicaid.  [name], LCSW Patient Care Center [facility] [phone]

## 2022-09-10 ENCOUNTER — Telehealth: Payer: Self-pay | Admitting: Clinical

## 2022-09-10 NOTE — Telephone Encounter (Signed)
Integrated Behavioral Health Progress Note  09/10/2022 Name: Sharon Hunt MRN: [id number] DOB: Jun 23, 1970 Sharon Hunt is a 52 y.o. year old female who sees Ivonne Andrew, NP for primary care. LCSW was initially consulted to assist the patient with [employer]  and Financial Difficulties related to lack of income and lack of health coverage.   Interpreter: No.   Interpreter Name & Language: none  Assessment: Patient experiencing financial difficulties related to lack of health coverage and income.    Ongoing Intervention: Called patient to check in. Updated her on advice from pharmacist about stopping [employer]. Patient needs assistance with referrals to ophthalmology, dentist, and podiatry. CSW to coordinate with referral coordinator on this.  [name], LCSW Patient Care Center [facility] [phone]

## 2022-09-16 ENCOUNTER — Telehealth: Payer: Self-pay | Admitting: Clinical

## 2022-09-16 NOTE — Telephone Encounter (Signed)
Integrated Behavioral Health Progress Note  09/16/2022 Name: Sharon Hunt MRN: [id number] DOB: Jul 29, 1970 Sharon Hunt is a 52 y.o. year old female who sees Ivonne Andrew, NP for primary care. LCSW was initially consulted to assist the patient with [employer]  and Financial Difficulties related to lack of income and lack of health coverage.   Interpreter: No.   Interpreter Name & Language: none  Assessment: Patient experiencing financial difficulties related to lack of health coverage and income.    Ongoing Intervention: Called patient and reminded her of her appointment with a doctor for Disability Determination Services (DDS) assessment tomorrow. Patient did remember the appointment and has transportation; stated her daughter is taking her. CSW to follow, will plan to assist patient in making specialist appointments - podiatry, dental, etc after this DDS appointment is done.  [name], LCSW Patient Care Center [facility] [phone]

## 2022-09-19 ENCOUNTER — Telehealth: Payer: Self-pay | Admitting: Clinical

## 2022-09-19 NOTE — Telephone Encounter (Addendum)
Integrated Behavioral Health Progress Note  09/19/2022 Name: Sharon Hunt MRN: [id number] DOB: 11-Jun-1970 Sharon Hunt is a 52 y.o. year old female who sees Ivonne Andrew, NP for primary care. LCSW was initially consulted to assist the patient with [employer]  and Financial Difficulties related to lack of income and lack of health coverage.   Interpreter: No.   Interpreter Name & Language: none  Assessment: Patient experiencing financial difficulties related to lack of health coverage and income.    Ongoing Intervention: Called patient and checked in regarding her DDS physician assessment on 7/3. Supportive counseling provided around this, as patient's anxiety was really high for the appointment. Praised patient for attending the appointment and advocating for herself.   Discussed specialty referrals that patient needs. Patient agreeable to referral to [facility] for ophthalmology; advised referral coordinator of this. Patient was previously referred to [facility] for podiatry, but did not attend the appointment; had planned to call them with patient today but their office is closed on Fridays, will plan to call next week.   Patient in need of dental appointment as well. Sent patient information on dental offices that take Medicaid; will plan to assist patient in making an appointment with one of these offices.   [name], LCSW Patient Care Center [facility] [phone]

## 2022-09-22 ENCOUNTER — Other Ambulatory Visit (HOSPITAL_COMMUNITY): Payer: Self-pay

## 2022-09-22 ENCOUNTER — Telehealth: Payer: Self-pay | Admitting: Clinical

## 2022-09-22 NOTE — Telephone Encounter (Signed)
Integrated Behavioral Health Progress Note  09/22/2022 Name: Sharon Hunt MRN: [id number] DOB: 07-Jul-1970 Sharon Hunt is a 52 y.o. year old female who sees Ivonne Andrew, NP for primary care. LCSW was initially consulted to assist the patient with [employer]  and Financial Difficulties related to lack of income and lack of health coverage.   Interpreter: No.   Interpreter Name & Language: none  Assessment: Patient experiencing financial difficulties related to lack of health coverage and income.    Ongoing Intervention: Called [facility] and scheduled new patient appointment for patient on 10/08/22. Called patient and advised her of the appointment. Also emailed it to her at her request. Informed patient of [facility], which takes her Medicaid and advised that she call them to make an appointment.  [name], LCSW Patient Care Center [facility] [phone]

## 2022-09-24 ENCOUNTER — Other Ambulatory Visit (HOSPITAL_COMMUNITY): Payer: Self-pay

## 2022-10-06 ENCOUNTER — Telehealth: Payer: Self-pay | Admitting: Clinical

## 2022-10-06 NOTE — Telephone Encounter (Signed)
Integrated Behavioral Health Progress Note  10/06/2022 Name: Sharon Hunt MRN: [id number] DOB: 1970/03/25 [name] is a 52 y.o. year old female who sees Ivonne Andrew, NP for primary care. LCSW was initially consulted to assist the patient with [employer]  and Financial Difficulties related to lack of income and lack of health coverage.   Interpreter: No.   Interpreter Name & Language: none  Assessment: Patient experiencing financial difficulties related to lack of health coverage and income. She is also experiencing mental health concerns.   Ongoing Intervention: CSW called patient to check in about her upcoming psychiatry evaluation for DDS. The appointment is in [facility] and she did not have transportation arranged yet for this appointment. CSW called [employer] and arranged transportation to the appointment for patient. Patient feeling anxious about the appointment. Some brief supportive counseling provided around this.   Patient is not connected with therapy or psychiatry currently for ongoing treatment but she is interested in this. She was only recently approved for Medicaid and being uninsured was a barrier to care previously. Patient is agreeable to referral to [facility] for counseling. CSW to also look into psychiatrists in network with patient's insurance. She could go to [facility]) for this, but with her intense anxiety symptoms, the environment at [facility] would likely trigger patient significantly, so will look into other options.  [name], LCSW Patient Care Center [facility] [phone]

## 2022-10-08 ENCOUNTER — Ambulatory Visit (INDEPENDENT_AMBULATORY_CARE_PROVIDER_SITE_OTHER): Payer: Medicaid Other | Admitting: Podiatry

## 2022-10-08 ENCOUNTER — Telehealth: Payer: Self-pay | Admitting: Clinical

## 2022-10-08 DIAGNOSIS — Z91199 Patient's noncompliance with other medical treatment and regimen due to unspecified reason: Secondary | ICD-10-CM

## 2022-10-08 IMAGING — CR DG CHEST 2V
2 series · 2 of 2 positions shown · non-contrast
Comparison: 01/05/2019

CLINICAL DATA: Cough

EXAM:
CHEST - 2 VIEW

[chest lat]
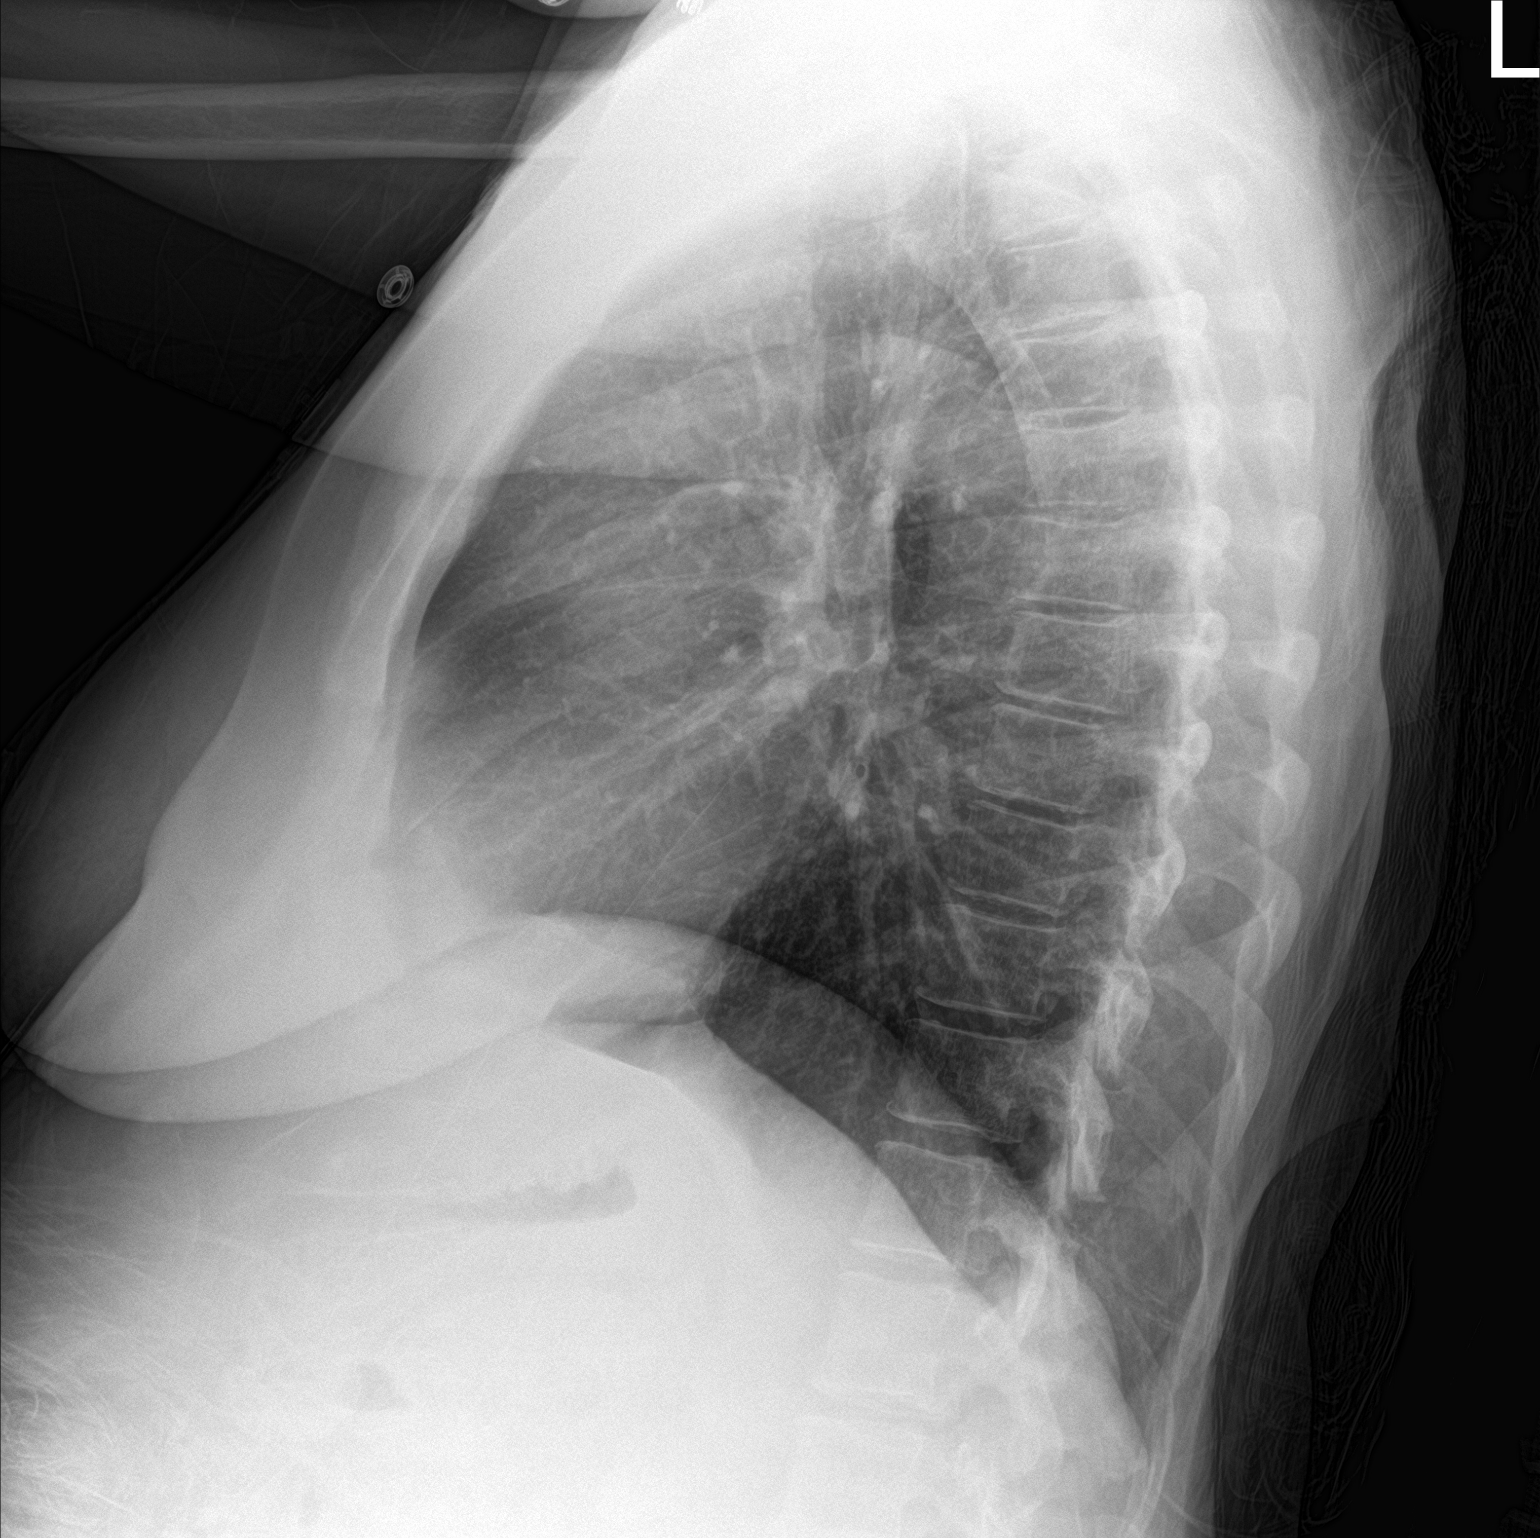

[chest ap]
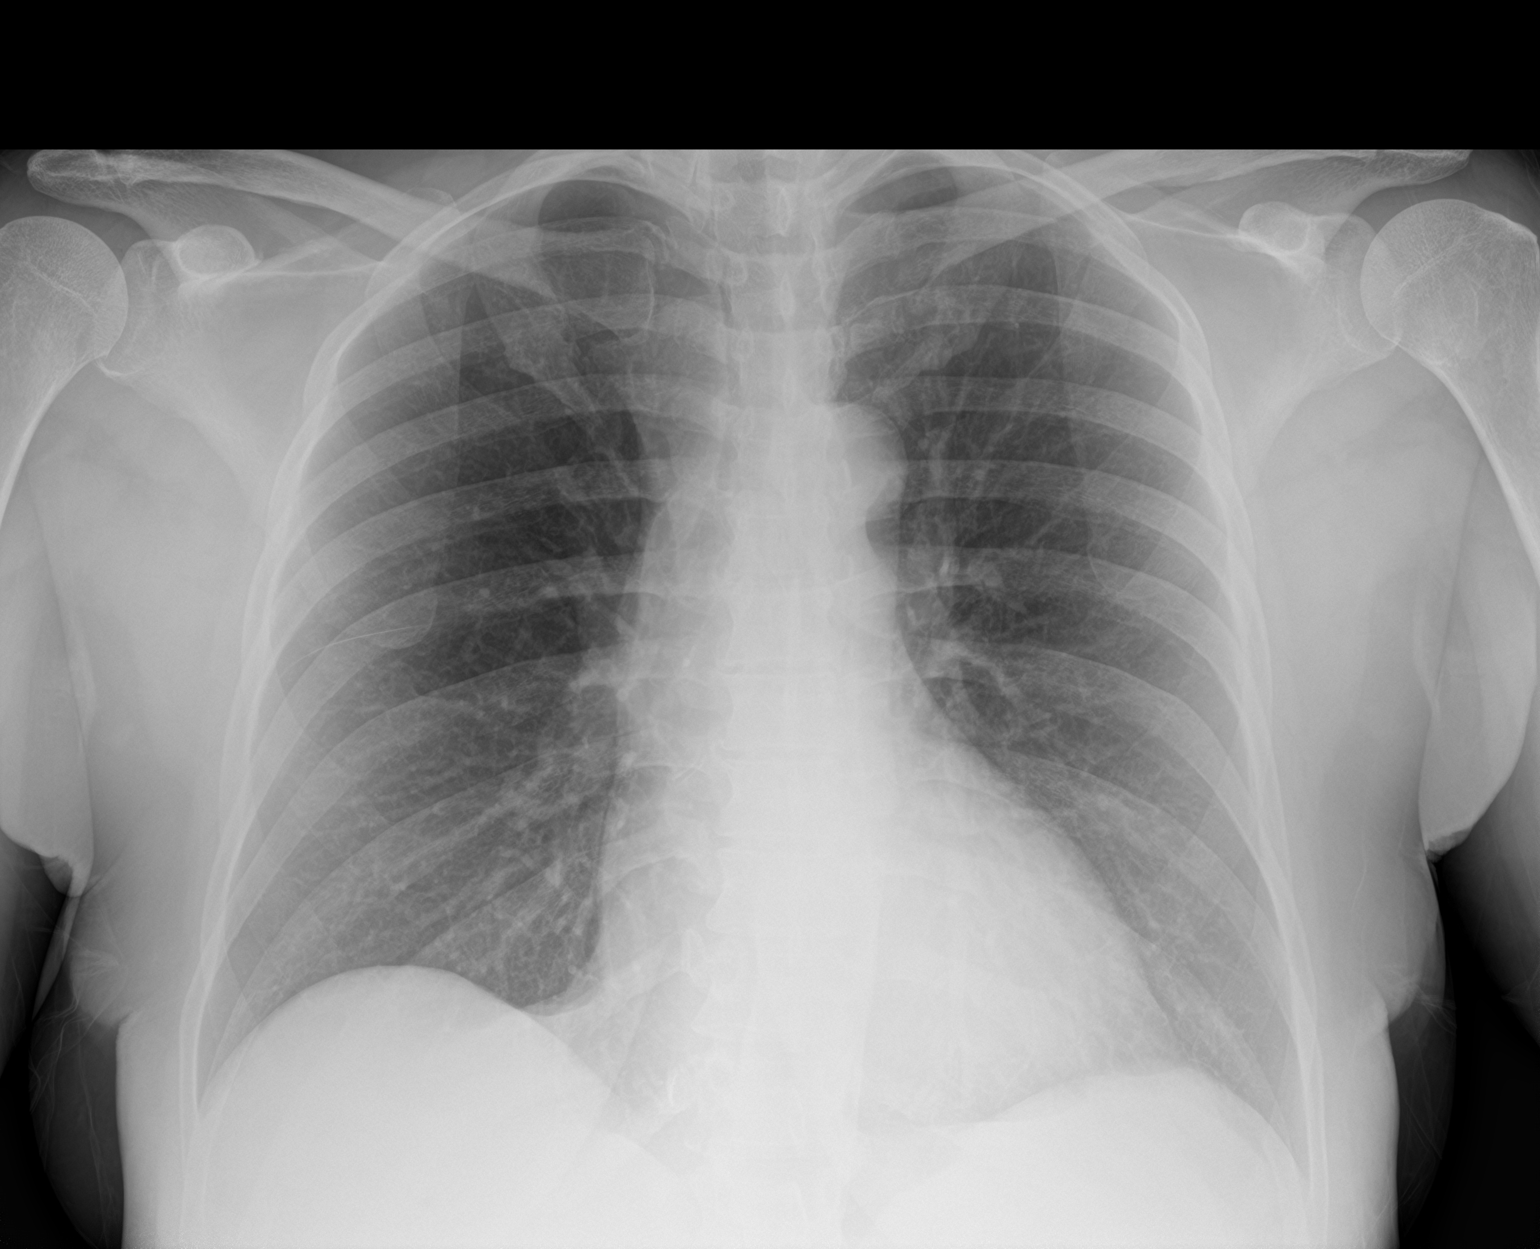

[2 of 2 positions shown; findings below may reference images not displayed]

FINDINGS: The heart size and mediastinal contours are within normal limits.
Both lungs are clear. The visualized skeletal structures are
unremarkable.
IMPRESSION: Normal study.

## 2022-10-08 IMAGING — CT CT ABD-PELV W/ CM
2 of 5 series · 16 of 46 positions shown, 18 images · IV contrast (agent unspecified)
Comparison: 03/09/2015.

CLINICAL DATA: Abdominal pain, hematemesis.

EXAM:
CT ABDOMEN AND PELVIS WITH CONTRAST
TECHNIQUE: Multidetector CT imaging of the abdomen and pelvis was performed
using the standard protocol following bolus administration of
intravenous contrast.

[Series 3: abd/ pelvis 5.0 i30f 2 · axial · 0.87mm/px · z∈[+734,+1124]mm · 13 of 88 slices shown, 15 images]
[im 5/88  soft-tissue]
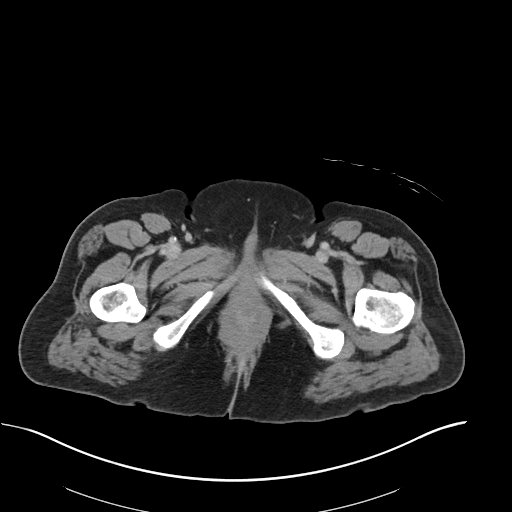
[im 5/88  bone]
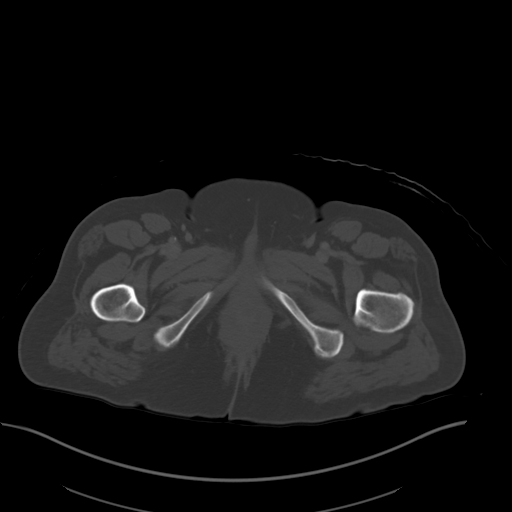
[im 14/88  soft-tissue]
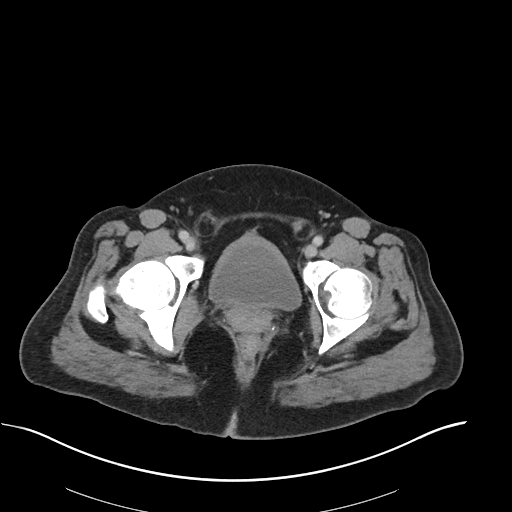
[im 19/88  soft-tissue]
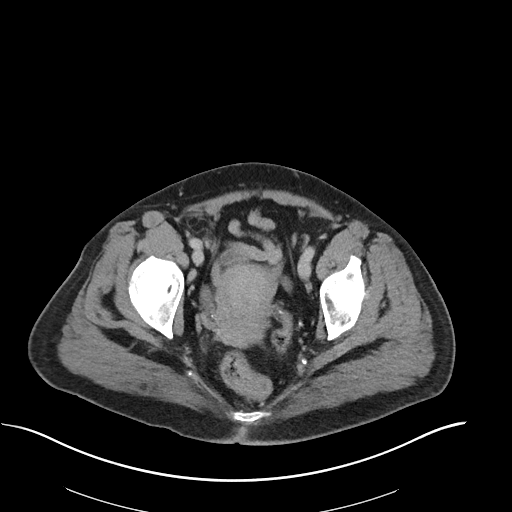
[im 23/88  soft-tissue]
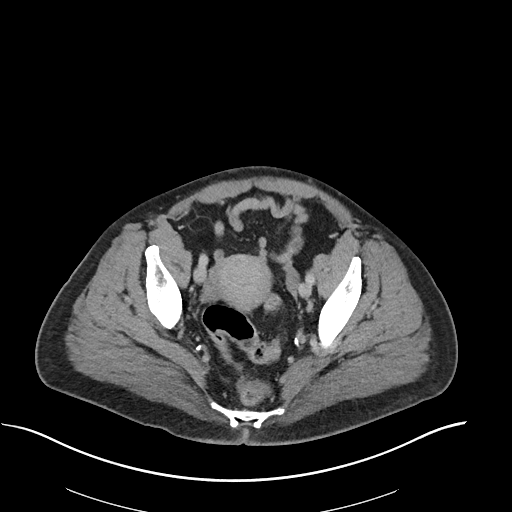
[im 33/88  soft-tissue]
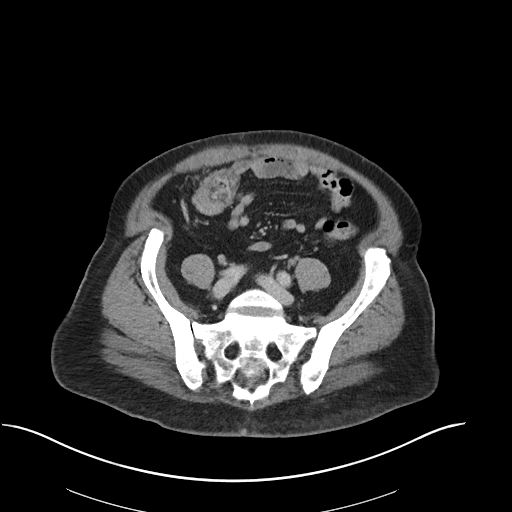
[im 37/88  soft-tissue]
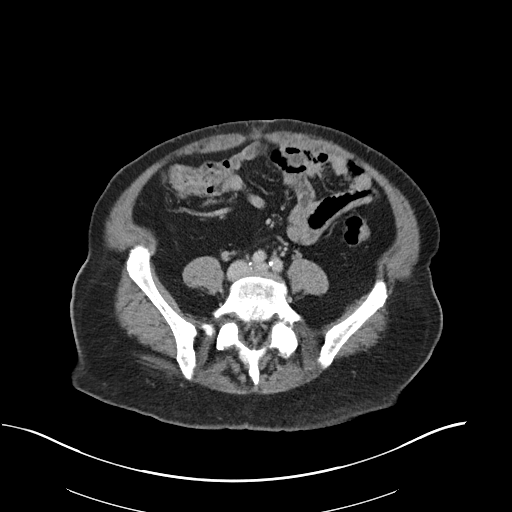
[im 46/88  soft-tissue]
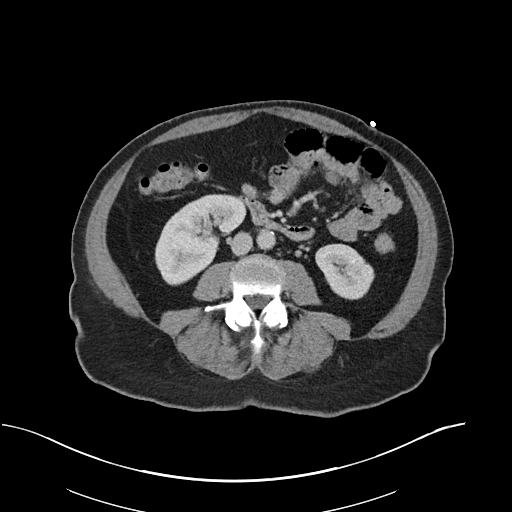
[im 51/88  soft-tissue]
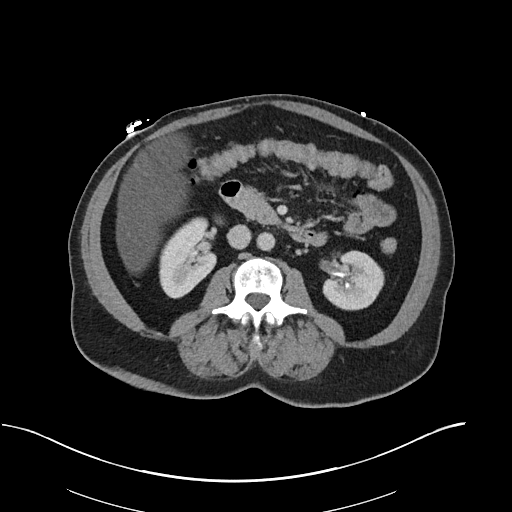
[im 55/88  soft-tissue]
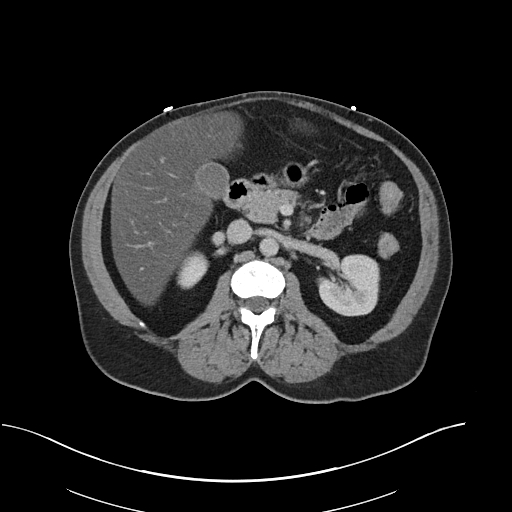
[im 55/88  bone]
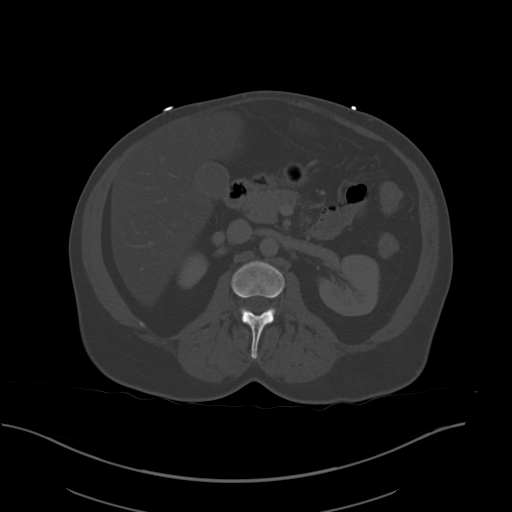
[im 65/88  soft-tissue]
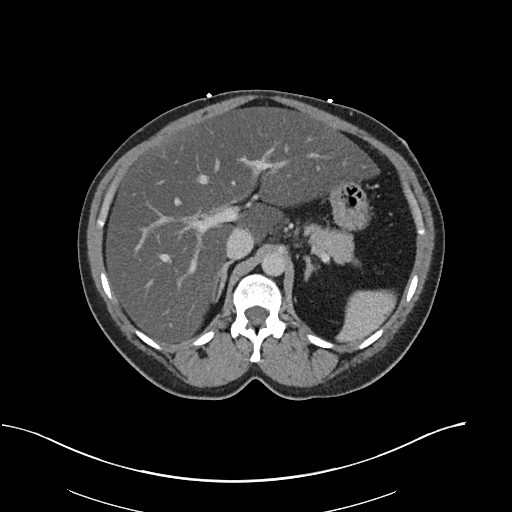
[im 69/88  soft-tissue]
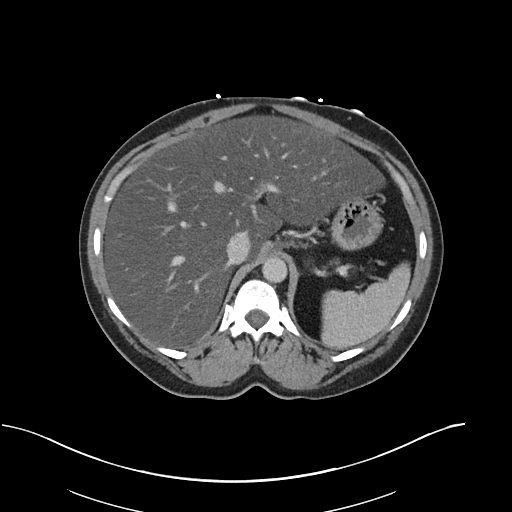
[im 74/88  soft-tissue]
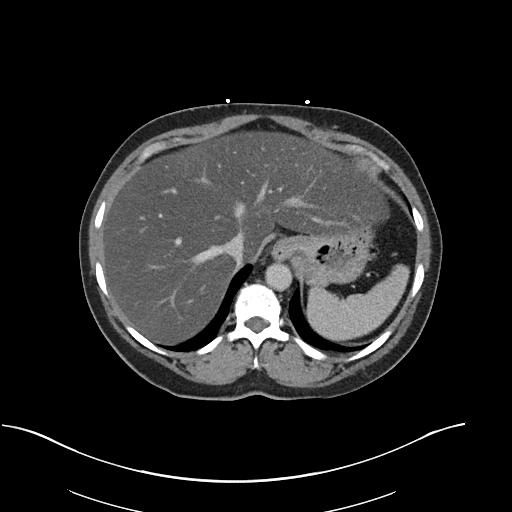
[im 83/88  soft-tissue]
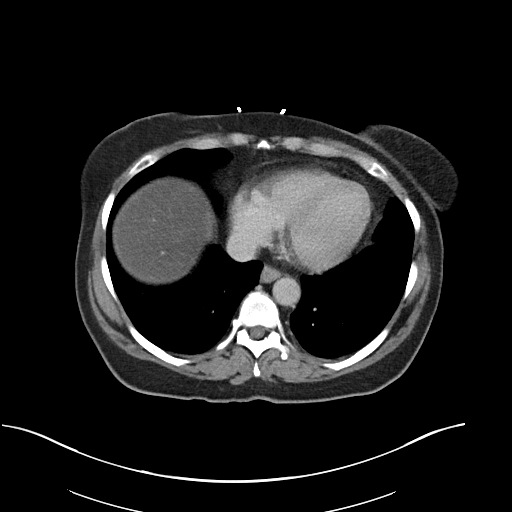

[Series 7: coronal soft tissue · coronal · 0.72mm/px · 3 of 105 slices shown]
[im 35/105  soft-tissue]
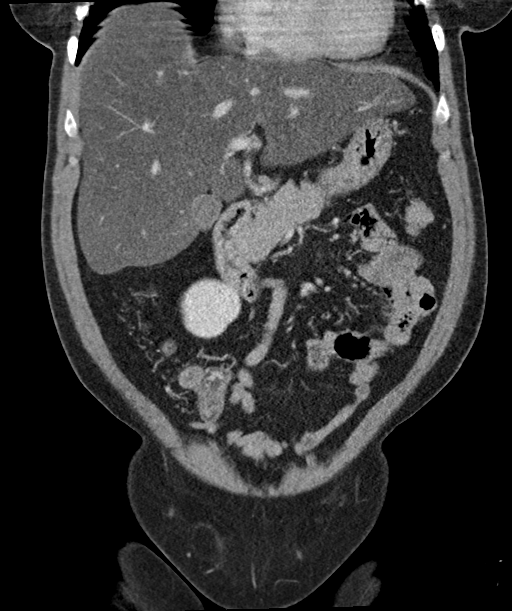
[im 47/105  soft-tissue]
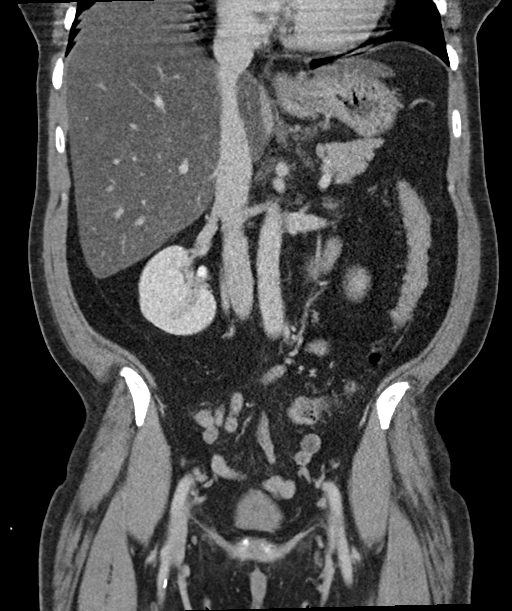
[im 58/105  soft-tissue]
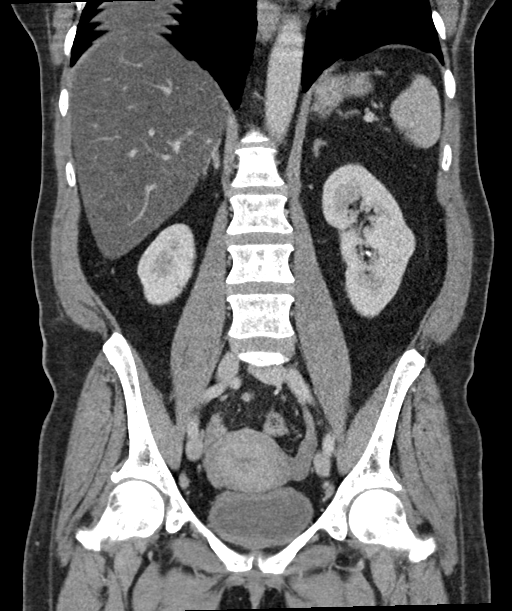

[16 of 46 positions shown; findings below may reference images not displayed]

RADIATION DOSE REDUCTION: This exam was performed according to the
departmental dose-optimization program which includes automated
exposure control, adjustment of the mA and/or kV according to
patient size and/or use of iterative reconstruction technique.

CONTRAST:  80mL OMNIPAQUE IOHEXOL 300 MG/ML  SOLN
FINDINGS: Lower chest: No acute abnormality.

Hepatobiliary: There is marked hepatic steatosis. No focal liver
abnormality. The gallbladder is without stones. No biliary ductal
dilatation.

Pancreas: Unremarkable. No pancreatic ductal dilatation or
surrounding inflammatory changes.

Spleen: Normal in size without focal abnormality.

Adrenals/Urinary Tract: No adrenal nodule or mass. There is
malrotation of the right kidney. No definite renal calculus.
Examination is limited due the presence of excreted contrast at the
renal pyramids. No hydronephrosis. Diffuse bladder wall thickening
is noted.

Stomach/Bowel: Stomach is within normal limits. Appendix appears
normal. No evidence of bowel wall thickening, distention, or
inflammatory changes.

Vascular/Lymphatic: No significant vascular findings are present. No
enlarged abdominal or pelvic lymph nodes.

Reproductive: Uterus and bilateral adnexa are unremarkable.

Other: A small amount of free fluid is present in the perirectal
space on the right. Small fat containing periumbilical hernia. Fat
containing inguinal hernias are present bilaterally.

Musculoskeletal: There is sclerosis at the sacroiliac joints
bilaterally, compatible with sacroiliitis. Degenerative changes are
noted in the thoracolumbar spine. No acute osseous abnormality.
IMPRESSION: 1. No bowel obstruction or free air.
2. Mild bladder wall thickening, possible infectious or inflammatory
cystitis.
3. Marked hepatic steatosis.

## 2022-10-08 NOTE — Telephone Encounter (Signed)
Integrated Behavioral Health Progress Note  10/08/2022 Name: Sharon Hunt MRN: [id number] DOB: Aug 05, 1970 [name] is a 52 y.o. year old female who sees Ivonne Andrew, NP for primary care. LCSW was initially consulted to assist the patient with [employer]  and Financial Difficulties related to lack of income and lack of health coverage.   Interpreter: No.   Interpreter Name & Language: none  Assessment: Patient experiencing financial difficulties related to lack of health coverage and income. She is also experiencing mental health concerns.   Ongoing Intervention: Patient called CSW and reported that her DDS psychiatric evaluation for tomorrow had been cancelled. She was quite anxious about having received the call about the cancellation and so had also missed her podiatry appointment this morning. Called [facility] and rescheduled patient's podiatry appointment for Aug 5 at 9am.  CSW called the office where she was to have the DDS evaluation and confirmed that the appointment was in fact cancelled. They advised that patient will receive a letter from DDS with a new appointment. Cancelled patient's [employer] transportation to the appointment. [employer], who assisted with patient's disability claim, to advise that she would be rescheduled for the psych eval.   Completed referral to [facility] for [facility] health counseling.   [name], LCSW Patient Care Center [facility] [phone]

## 2022-10-08 NOTE — Progress Notes (Signed)
No show

## 2022-10-09 ENCOUNTER — Other Ambulatory Visit: Payer: Self-pay | Admitting: Nurse Practitioner

## 2022-10-09 ENCOUNTER — Telehealth: Payer: Self-pay | Admitting: Clinical

## 2022-10-09 DIAGNOSIS — E1140 Type 2 diabetes mellitus with diabetic neuropathy, unspecified: Secondary | ICD-10-CM

## 2022-10-09 NOTE — Telephone Encounter (Addendum)
Integrated Behavioral Health Progress Note  10/09/2022 Name: Sharon Hunt MRN: [id number] DOB: December 22, 1970 Sharon Hunt is a 52 y.o. year old female who sees Ivonne Andrew, NP for primary care. LCSW was initially consulted to assist the patient with [employer]  and Financial Difficulties related to lack of income and lack of health coverage.   Interpreter: No.   Interpreter Name & Language: none  Assessment: Patient experiencing financial difficulties related to lack of health coverage and income. She is also experiencing mental health concerns.   Ongoing Intervention: Patient emailed CSW and requested a call. CSW called patient. She is worried about her housing, as she hasn't been able to pay her rent for June or July. She does not have income and is still waiting on her disability claim. Her psychiatric assessment for disability is being reschedule due to the provider. Patient has reached out for rental assistance to several community agencies and they do not have funding. Her rent is $1768/month. She is considering an assisted living facility, but is not sure where to start. As patient does not have income, unfortunately we wouldn't be able to apply for assistance, as she won't have a long term plan for maintaining her housing. Supportive counseling provided today.   [name], LCSW Patient Care Center [facility] [phone]

## 2022-10-10 ENCOUNTER — Telehealth: Payer: Self-pay | Admitting: Clinical

## 2022-10-10 NOTE — Telephone Encounter (Signed)
Integrated Behavioral Health Progress Note  10/10/2022 Name: Sharon Hunt MRN: [id number] DOB: 05-30-1970 Sharon Hunt is a 52 y.o. year old female who sees Ivonne Andrew, NP for primary care. LCSW was initially consulted to assist the patient with [employer]  and Financial Difficulties related to lack of income and lack of health coverage.   Interpreter: No.   Interpreter Name & Language: none  Assessment: Patient experiencing financial difficulties related to lack of health coverage and income. She is also experiencing mental health concerns.   Ongoing Intervention: Patient emailed CSW and reported that she had spoken to her landlord. Her landlord had indicated that patient will be served with an eviction. CSW called patient on [phone] and LVM. Also called patient on her [phone] number and LVM.   [name], LCSW Patient Care Center [facility] [phone]

## 2022-10-13 ENCOUNTER — Telehealth: Payer: Self-pay | Admitting: Clinical

## 2022-10-13 NOTE — Telephone Encounter (Signed)
Integrated Behavioral Health Progress Note  10/13/2022 Name: PRAISE OGILVIE MRN: [id number] DOB: [birth year] SHANNAN CAUGHEY is a 52 y.o. year old female who sees Ivonne Andrew, NP for primary care. LCSW was initially consulted to assist the patient with [employer]  and Financial Difficulties related to lack of income and lack of health coverage.   Interpreter: No.   Interpreter Name & Language: none  Assessment: Patient experiencing financial difficulties related to lack of health coverage and income. She is also experiencing mental health concerns.   Ongoing Intervention: CSW called patient to follow up on her message from last week about eviction. She had indicated that her landlord would be evicting her. Patient reported today that she has not received any eviction papers yet. Advised patient on the general process of evictions, including option for appeal.   Discussed mental health referrals. Did refer to [facility] for counseling, but they have about a 10 week wait list. Did send referral today to [facility], which provides psychiatry and counseling. Will continue to work on referral if needed, ideally to an office that provides counseling and psychiatry in the same location.   [name], LCSW Patient Care Center [facility] [phone]

## 2022-10-14 ENCOUNTER — Telehealth: Payer: Self-pay | Admitting: Clinical

## 2022-10-14 NOTE — Telephone Encounter (Signed)
Integrated Behavioral Health Progress Note  10/14/2022 Name: Sharon Hunt MRN: [id number] DOB: 02-Dec-1970 Sharon Hunt is a 52 y.o. year old female who sees Ivonne Andrew, NP for primary care. LCSW was initially consulted to assist the patient with [employer]  and Financial Difficulties related to lack of income and lack of health coverage.   Interpreter: No.   Interpreter Name & Language: none  Assessment: Patient experiencing financial difficulties related to lack of health coverage and income. She is also experiencing mental health concerns.   Ongoing Intervention: CSW called patient to update her. [facility] sent information about patient's rescheduled psychiatry evaluation for Disability Determination Services (DDS). CSW advised patient of this new appointment. Patient should also receive the information in the mail. Also updated patient that her ophthalmology referral has been sent.  Patient reported that she did receive eviction papers from the sheriff today. Her court date is 10/21/22. Patient is trying to take it one day at a time. She is feeling stressed about this, but plans to meet with Legal Aid while at the courthouse on 8/6. Her home has had a water leak most of the time she has lived there, which has really driven up the water bill. Advised patient to discuss this issue with Legal Aid as well.  [name], LCSW Patient Care Center [facility] [phone]

## 2022-10-16 ENCOUNTER — Telehealth: Payer: Self-pay | Admitting: Clinical

## 2022-10-16 NOTE — Telephone Encounter (Signed)
Integrated Behavioral Health Progress Note  10/16/2022 Name: Sharon Hunt MRN: [id number] DOB: 11/14/1970 [name] is a 52 y.o. year old female who sees Ivonne Andrew, NP for primary care. LCSW was initially consulted to assist the patient with [employer]  and Financial Difficulties related to lack of income and lack of health coverage.   Interpreter: No.   Interpreter Name & Language: none  Assessment: Patient experiencing financial difficulties related to lack of health coverage and income. She is also experiencing mental health concerns.   Ongoing Intervention: Patient called CSW. She was anxious about her upcoming podiatry appointment and eviction court hearing. She needs help with transportation to these appointments. CSW arranged transportation with [employer] for the podiatry appointment. Patient's car is being repossessed and her anxiety is too high to drive downtown for court regardless. CSW to arrange taxi transportation for patient to the court date.   Patient may have gotten a call from [facility], where we sent a psychiatry and counseling referral. She could not remember but had their name and phone number written down. Called [facility] together with patient and left a voicemail requesting call back to schedule.  [facility] is scheduling a few weeks out for counseling. Due to patient's high anxiety, scheduled her for Integrated Behavioral Health (IBH) counseling for 10/28/22, to provide bridge until she can connect with a longer term counselor.  [name], LCSW Patient Care Center [facility] [phone]

## 2022-10-17 ENCOUNTER — Telehealth: Payer: Self-pay | Admitting: Clinical

## 2022-10-17 NOTE — Telephone Encounter (Signed)
Integrated Behavioral Health Progress Note  10/17/2022 Name: Sharon Hunt MRN: [id number] DOB: [birth year] Sharon Hunt is a 52 y.o. year old female who sees Ivonne Andrew, NP for primary care. LCSW was initially consulted to assist the patient with [employer]  and Financial Difficulties related to lack of income and lack of health coverage.   Interpreter: No.   Interpreter Name & Language: none  Assessment: Patient experiencing financial difficulties related to lack of health coverage and income. She is also experiencing mental health concerns.   Ongoing Intervention: Called patient and confirmed for her that transportation is arranged for her podiatry appointment 8/5 and her eviction court appointment 8/6. Also scheduled patient for Integrated Behavioral Health (IBH) counseling appointment 8/13 while we work on connecting her with psychiatry and a long term Veterinary surgeon.   [name], LCSW Patient Care Center [facility] [phone]

## 2022-10-20 ENCOUNTER — Encounter: Payer: Self-pay | Admitting: Podiatry

## 2022-10-20 ENCOUNTER — Telehealth: Payer: Self-pay | Admitting: Clinical

## 2022-10-20 ENCOUNTER — Other Ambulatory Visit (HOSPITAL_COMMUNITY): Payer: Self-pay

## 2022-10-20 ENCOUNTER — Ambulatory Visit (INDEPENDENT_AMBULATORY_CARE_PROVIDER_SITE_OTHER): Payer: Medicaid Other | Admitting: Podiatry

## 2022-10-20 VITALS — BP 203/108 | HR 111 | Ht 65.0 in | Wt 158.0 lb

## 2022-10-20 DIAGNOSIS — M79675 Pain in left toe(s): Secondary | ICD-10-CM | POA: Diagnosis not present

## 2022-10-20 DIAGNOSIS — L0889 Other specified local infections of the skin and subcutaneous tissue: Secondary | ICD-10-CM | POA: Diagnosis not present

## 2022-10-20 DIAGNOSIS — E1140 Type 2 diabetes mellitus with diabetic neuropathy, unspecified: Secondary | ICD-10-CM | POA: Diagnosis not present

## 2022-10-20 DIAGNOSIS — B353 Tinea pedis: Secondary | ICD-10-CM

## 2022-10-20 DIAGNOSIS — M79674 Pain in right toe(s): Secondary | ICD-10-CM | POA: Diagnosis not present

## 2022-10-20 DIAGNOSIS — B351 Tinea unguium: Secondary | ICD-10-CM

## 2022-10-20 MED ORDER — KETOCONAZOLE 2 % EX CREA
1.0000 | TOPICAL_CREAM | Freq: Every day | CUTANEOUS | 2 refills | Status: AC
  Filled 2022-10-20 – 2022-10-31 (×2): qty 60, 30d supply, fill #0

## 2022-10-20 NOTE — Telephone Encounter (Signed)
Integrated Behavioral Health Progress Note  10/20/2022 Name: Sharon Hunt MRN: [id number] DOB: [birth year] PAMI FAVRET is a 52 y.o. year old female who sees Ivonne Andrew, NP for primary care. LCSW was initially consulted to assist the patient with [employer]  and Financial Difficulties related to lack of income and lack of health coverage.   Interpreter: No.   Interpreter Name & Language: none  Assessment: Patient experiencing financial difficulties related to lack of health coverage and income. She is also experiencing mental health concerns.   Ongoing Intervention: Patient called CSW after her podiatry appointment today. She also reported her water is shut off at her home, but she is not sure why, as the bill is paid. She has had an ongoing issue with a leak which has caused her bill to be very high. She has eviction court tomorrow, and CSW reminded patient to discuss the ongoing water issue with a Legal Aid representative at the court house, in addition to the eviction. CSW also sent Legal Aid referral ahead of her court date tomorrow.  Patient experiencing emotional distress related to her current stressors. Brief supportive counseling provided around this; emotional validation and reflective listening. Patient was able to note some positive things that have happened today and recently, including a positive experience with the podiatrist. Patient has a history of SI. She stated today, "I have to get myself together or suicidal thoughts will start and I don't want that. I want to live." Patient went on to explain that she has some things to live for including her grandson. Patient plans to attend court tomorrow, as well as Integrated Behavioral Health (IBH) appointment with CSW next week.   [name], LCSW Patient Care Center [facility] [phone]

## 2022-10-20 NOTE — Progress Notes (Signed)
  Subjective:  Patient ID: Sharon Hunt, female    DOB: Feb 23, 1971,   MRN: [id number]  Chief Complaint  Patient presents with   Diabetes    Has bilateral foot pain states she has anxiety and would like the door open    Nail Problem    States toe nails are black and needs trimmed    52 y.o. female presents for concern of thickened elongated and painful nails that are difficult to trim. Requesting to have them trimmed today. Relates burning and tingling in their feet. Patient is diabetic and last A1c was  Lab Results  Component Value Date   HGBA1C 5.2 07/09/2022   . Also relates some dryness and itching to both of her feet  PCP:  Ivonne Andrew, NP     PCP:  Ivonne Andrew, NP    . Denies any other pedal complaints. Denies n/v/f/c.   Past Medical History:  Diagnosis Date   GERD (gastroesophageal reflux disease)    H/O hiatal hernia    Hyperlipidemia    Hypertension    Type 2 diabetes mellitus with chronic painful diabetic neuropathy (HCC) 12/20/2021    Objective:  Physical Exam: Vascular: DP/PT pulses 2/4 bilateral. CFT <3 seconds. Absent hair growth on digits. Edema noted to bilateral lower extremities. Xerosis noted bilaterally.  Skin. No lacerations or abrasions bilateral feet. Nails 1-5 bilateral  are thickened discolored and elongated with subungual debris. Scaling and erythemetous patches noted on plantar and medial feet bilateral.  Musculoskeletal: MMT 5/5 bilateral lower extremities in DF, PF, Inversion and Eversion. Deceased ROM in DF of ankle joint.  Neurological: Sensation intact to light touch. Protective sensation diminished bilateral.    Assessment:   1. Pain due to onychomycosis of toenails of both feet   2. Type 2 diabetes mellitus with chronic painful diabetic neuropathy (HCC)   3. Tinea pedis of both feet      Plan:  Patient was evaluated and treated and all questions answered. -Discussed and educated patient on diabetic foot care, especially  with  regards to the vascular, neurological and musculoskeletal systems.  -Stressed the importance of good glycemic control and the detriment of not  controlling glucose levels in relation to the foot. -Discussed supportive shoes at all times and checking feet regularly.  -Mechanically debrided all nails 1-5 bilateral using sterile nail nipper and filed with dremel without incident -Ketoconazole sent for tinea pedis.   -Answered all patient questions -Patient to return  in 3 months for at risk foot care -Patient advised to call the office if any problems or questions arise in the meantime.   Louann Sjogren, DPM

## 2022-10-21 ENCOUNTER — Telehealth: Payer: Self-pay | Admitting: Clinical

## 2022-10-21 NOTE — Telephone Encounter (Signed)
Integrated Behavioral Health Progress Note  10/21/2022 Name: YOMAYRA CRAPPS MRN: [id number] DOB: 1970-05-11 SHAMIA GULLING is a 52 y.o. year old female who sees Ivonne Andrew, NP for primary care. LCSW was initially consulted to assist the patient with [employer]  and Financial Difficulties related to lack of income and lack of health coverage.   Interpreter: No.   Interpreter Name & Language: none  Assessment: Patient experiencing financial difficulties related to lack of health coverage and income. She is also experiencing mental health concerns.   Ongoing Intervention: Patient called and was having high anxiety ahead of her eviction court appointment today. She cancelled her transportation with [employer] taxi that we had arranged and reported that her son was going to take her.   Completed a deep breathing exercise over the phone with patient due to high anxiety.  [name], LCSW [facility] [phone]

## 2022-10-22 ENCOUNTER — Telehealth: Payer: Self-pay | Admitting: Clinical

## 2022-10-22 NOTE — Telephone Encounter (Addendum)
Legal aid - has lease, social security paperwork Continuance on 8/14 with legal aid   Deed to house is not under landlord's name, its under a trust She's the trustee  Something going on with the deed  Wasn't supposed to sign the lease with Lowella Bandy, was supposed to sign it with Mr. Trey Paula   Go back on the 14th - landlord asked how she gets it fixed  I signed the lease over a year ago so maybe its not valid??  On 14th bring    Cried when I got home, drank

## 2022-10-28 ENCOUNTER — Ambulatory Visit: Payer: Medicaid Other | Admitting: Clinical

## 2022-10-28 DIAGNOSIS — F411 Generalized anxiety disorder: Secondary | ICD-10-CM | POA: Diagnosis not present

## 2022-10-28 DIAGNOSIS — F339 Major depressive disorder, recurrent, unspecified: Secondary | ICD-10-CM

## 2022-10-28 NOTE — BH Specialist Note (Signed)
Integrated Behavioral Health Initial Visit via Telemedicine   10/28/2022 Sharon Hunt [id number]  Number of Integrated Behavioral Health Clinician visits: 1- Initial Visit  Session Start time: 0804   Session End time: 0841  Total time in minutes: 37  Referring Provider: Angus Seller, NP Patient/Family location: [address] [place], [place] (home) [facility] Provider location: Patient Care Center All persons participating in visit: CSW, patient Types of Service: Individual psychotherapy and Telephone visit  I connected with Sharon Hunt via Telephone and verified that I am speaking with the correct person using two identifiers. Discussed confidentiality: Yes   I discussed the limitations of telemedicine and the availability of in person appointments.  Discussed there is a possibility of technology failure and discussed alternative modes of communication if that failure occurs.  I discussed that engaging in this telemedicine visit, they consent to the provision of behavioral healthcare and the services will be billed under their insurance.  Patient and/or legal guardian expressed understanding and consented to Telemedicine visit: Yes   Presenting Concerns: Patient and/or family reports the following symptoms/concerns: severe social anxiety and fear of germs Duration of problem: several years; Severity of problem: severe  Patient and/or Family's Strengths/Protective Factors: Social connections, Concrete supports in place (healthy food, safe environments, etc.), and Sense of purpose  Goals Addressed: Patient will:  Reduce symptoms of: anxiety and depression   Increase knowledge and/or ability of: coping skills and self-management skills   Demonstrate ability to: Increase healthy adjustment to current life circumstances and Increase adequate support systems for patient/family  Progress towards Goals: Ongoing  Interventions: Interventions utilized:  Supportive  Counseling Standardized Assessments completed: Not Needed  Patient called CSW earlier than scheduled phone visit and expressed high anxiety due to recent interpersonal interactions. Was able to have an abbreviated session with patient at the time she called. Provided supportive counseling around recent events. Patient did report some positive interactions with others, such as a man at the grocery store paying for her groceries for her. And she had a positive call with her cousin who encouraged her to remain positive. Cousin also offered for patient to come live with her up [place], but patient does not want to give up what she has worked for here in [place]. Patient continues to feel overwhelmed by the eviction process, though is encouraged by the kindness of her Legal Aid attorney.   Provided encouragement and positive feedback to patient. Encouraged focus on the positives. Provided emotional validation and reflective listening.   Patient's anxiety is noticeably severe. She reported she had not received a call from [facility] yet to schedule for psychiatry. Called [facility] after call with patient. They indicated they would call her now to schedule. Received a call back from patient later in the day and again her anxiety was high; she reported she had received the call from [facility] and is scheduled for psychiatry assessment 10/30/22. They required her to complete intake paperwork online and she had become overwhelmed by this but completed it to the best of her ability. Praised patient for accomplishing this.   Patient also has eviction court again tomorrow, 10/29/22. Her son will drive her there again. Patient expressed worries about what will happen at court, though also states "I'm gonna get through this."  Patient and/or Family Response: Patient engaged in session.   Assessment: Patient currently experiencing severe social anxiety and fear of contamination. She is also experiencing housing  insecurity and facing eviction.   Patient may benefit from CBT  to explore and challenge negative thought patterns that exacerbate anxiety, as well as supportive counseling to validate emotions about her challenging circumstances. Patient can also benefit from connection to community resources to address housing and financial insecurity.  Plan: Follow up with behavioral health clinician on: will call to schedule Referral(s): Psychiatrist and Counselor  I discussed the assessment and treatment plan with the patient and/or parent/guardian. They were provided an opportunity to ask questions and all were answered. They agreed with the plan and demonstrated an understanding of the instructions.   They were advised to call back or seek an in-person evaluation if the symptoms worsen or if the condition fails to improve as anticipated.  [name], LCSW

## 2022-10-29 ENCOUNTER — Telehealth: Payer: Self-pay | Admitting: Clinical

## 2022-10-29 NOTE — Telephone Encounter (Unsigned)
Postponing because patient and lawyer have covid

## 2022-10-30 ENCOUNTER — Other Ambulatory Visit (HOSPITAL_COMMUNITY): Payer: Self-pay

## 2022-10-30 ENCOUNTER — Telehealth: Payer: Self-pay | Admitting: Clinical

## 2022-10-30 NOTE — Telephone Encounter (Signed)
Integrated Behavioral Health Progress Note  10/30/2022 Name: Sharon Hunt MRN: [id number] DOB: [birth year] Sharon Hunt is a 52 y.o. year old female who sees Ivonne Andrew, NP for primary care. LCSW was initially consulted to assist the patient with [employer]  and Financial Difficulties related to lack of income and lack of health coverage.   Interpreter: No.   Interpreter Name & Language: none  Assessment: Patient experiencing financial difficulties related to lack of health coverage and income. She is also experiencing mental health concerns.   Ongoing Intervention: CSW called patient to follow up on psychiatry appointment with [facility] today. Patient reported the appointment was supposed to be at 11:30am but she did not receive a call. Called [facility] together with patient on a conference call. They advised that she was sent a link for the virtual visit that she would have had to click on to join the visit. They offered to reschedule but the next available with the [facility] office was 11/24/22. Patient upset about this and expressed not understanding why she hadn't been called, though CSW and the [facility] staff member explained.   Hung up with [facility] and discussed options. Patient incredibly anxious about this process. Notably, her anxiety is a barrier to getting her into treatment for the anxiety. Advised that we can get patient in with her PCP to discuss mental health medications, as she trusts and is comfortable with her PCP. Scheduled patient for 11/10/22 with PCP and CSW will also plan to meet with patient at the same time.   Patient expressed thoughts of suicide. She denies intention currently for suicide. She agrees to keep herself safe today. She agrees to speak with CSW on the phone tomorrow. Patient agrees to call CSW if she is having thoughts of harming herself that become more severe and include a plan. Patient's grandson is also a protective factor, as she  has stated she wants to be here for him. She indicated she is hopeful her daughter will bring him over today. Did email patient information about her appointment with PCP on 8/26 at her request and also included other crisis resource information in the email.   Did receive a voice mail from [facility] later in the day and they indicated they had spoken to patient and gotten her scheduled for 9/9 and put her on the wait list if there are cancellations and she can be seen sooner.  [name], LCSW Patient [facility] [phone]

## 2022-10-31 ENCOUNTER — Other Ambulatory Visit: Payer: Self-pay

## 2022-10-31 ENCOUNTER — Other Ambulatory Visit (HOSPITAL_COMMUNITY): Payer: Self-pay

## 2022-10-31 ENCOUNTER — Telehealth: Payer: Self-pay | Admitting: Clinical

## 2022-10-31 NOTE — Telephone Encounter (Signed)
Integrated Behavioral Health Progress Note  10/31/2022 Name: ZAKHIA BURTNETT MRN: [id number] DOB: 02/28/1971 ARKESHA RAYSOR is a 52 y.o. year old female who sees Ivonne Andrew, NP for primary care. LCSW was initially consulted to assist the patient with [employer]  and Financial Difficulties related to lack of income and lack of health coverage.   Interpreter: No.   Interpreter Name & Language: none  Assessment: Patient experiencing financial difficulties related to lack of health coverage and income. She is also experiencing mental health concerns.   Ongoing Intervention: Called patient to check in after call yesterday. Patient reported she spoke to one of the women that works at [employer] she goes to, and the woman invited her to a bible study yesterday evening. She attended the (virtual) bible study and since then has been feeling much more hopeful. She is not having thoughts of suicide. She states "god's not done with me" and "I'm here for a reason." She plans to continue attending the bible study and is glad to be feeling a sense of community.   She also asked about a foot cream that she needs. It was prescribed but she's not able to pick it up at the pharmacy and doesn't have money for it. CSW discussed with pharmacist, Catie Clearance Coots, who will arrange for the cream to be mailed to patient.  CSW scheduled transportation with [employer] for patient to Disability Determination Services ([facility]) psychiatry evaluation 11/13/22.  [name], LCSW Patient [facility] [phone]

## 2022-11-03 ENCOUNTER — Other Ambulatory Visit (HOSPITAL_COMMUNITY): Payer: Self-pay

## 2022-11-03 ENCOUNTER — Other Ambulatory Visit: Payer: Self-pay

## 2022-11-03 ENCOUNTER — Telehealth: Payer: Self-pay | Admitting: Clinical

## 2022-11-03 NOTE — Telephone Encounter (Signed)
Integrated Behavioral Health Progress Note  11/03/2022 Name: Sharon Hunt MRN: [id number] DOB: 06/08/1970 Sharon Hunt is a 52 y.o. year old female who sees Ivonne Andrew, NP for primary care. LCSW was initially consulted to assist the patient with [employer]  and Financial Difficulties related to lack of income and lack of health coverage.   Interpreter: No.   Interpreter Name & Language: none  Assessment: Patient experiencing financial difficulties related to lack of health coverage and income. She is also experiencing mental health concerns.   Ongoing Intervention: Patient called and wanted some help with reminders about her upcoming appointments. Advised patient of doctor's appointments including PCP and [facility] psych evaluation. Patient wasn't sure of when her eviction court hearing was rescheduled for. CSW assisted patient in calling her Legal Aid attorney on conference call and left voice mail for attorney to inquire about the court date.  Patient also hasn't received her foot cream. She said the pharmacy was requiring her to pay the $4 co-pay before they will mail it to her. Patient does not have any income and does not have any money right now. CSW called [facility] and requested if patient can put it on the charge account. They agreed and indicated they'd mail patient the prescription.  [name], LCSW Patient [facility] Center [facility] [phone]

## 2022-11-10 ENCOUNTER — Encounter: Payer: Self-pay | Admitting: Nurse Practitioner

## 2022-11-10 ENCOUNTER — Ambulatory Visit (INDEPENDENT_AMBULATORY_CARE_PROVIDER_SITE_OTHER): Payer: Medicaid Other | Admitting: Nurse Practitioner

## 2022-11-10 ENCOUNTER — Ambulatory Visit (INDEPENDENT_AMBULATORY_CARE_PROVIDER_SITE_OTHER): Payer: Medicaid Other | Admitting: Clinical

## 2022-11-10 ENCOUNTER — Other Ambulatory Visit (HOSPITAL_COMMUNITY): Payer: Self-pay

## 2022-11-10 ENCOUNTER — Other Ambulatory Visit: Payer: Self-pay

## 2022-11-10 VITALS — BP 213/113 | HR 98

## 2022-11-10 DIAGNOSIS — L309 Dermatitis, unspecified: Secondary | ICD-10-CM

## 2022-11-10 DIAGNOSIS — F411 Generalized anxiety disorder: Secondary | ICD-10-CM

## 2022-11-10 DIAGNOSIS — F419 Anxiety disorder, unspecified: Secondary | ICD-10-CM | POA: Diagnosis not present

## 2022-11-10 DIAGNOSIS — F32A Depression, unspecified: Secondary | ICD-10-CM | POA: Diagnosis not present

## 2022-11-10 DIAGNOSIS — F339 Major depressive disorder, recurrent, unspecified: Secondary | ICD-10-CM

## 2022-11-10 MED ORDER — PAROXETINE HCL 10 MG PO TABS
10.0000 mg | ORAL_TABLET | Freq: Every day | ORAL | 2 refills | Status: DC
Start: 2022-11-10 — End: 2023-02-17
  Filled 2022-11-10 (×2): qty 30, 30d supply, fill #0

## 2022-11-10 MED ORDER — TRIAMCINOLONE ACETONIDE 0.5 % EX OINT
1.0000 | TOPICAL_OINTMENT | Freq: Two times a day (BID) | CUTANEOUS | 0 refills | Status: DC
Start: 2022-11-10 — End: 2023-02-17
  Filled 2022-11-10 (×2): qty 30, 15d supply, fill #0

## 2022-11-10 NOTE — Assessment & Plan Note (Signed)
-   PARoxetine (PAXIL) 10 MG tablet; Take 1 tablet (10 mg total) by mouth daily.  Dispense: 30 tablet; Refill: 2  2. Eczema, unspecified type  - triamcinolone ointment (KENALOG) 0.5 %; Apply 1 Application topically 2 (two) times daily.  Dispense: 30 g; Refill: 0    Follow up:  Follow up in 4 weeks

## 2022-11-10 NOTE — BH Specialist Note (Unsigned)
Pt to call [facility] psych place to do phone questions  Need to call pharmacy to request to charge the paxil and any exzcema prescriptions and have them mailed

## 2022-11-10 NOTE — Patient Instructions (Addendum)
1. Anxiety and depression  - PARoxetine (PAXIL) 10 MG tablet; Take 1 tablet (10 mg total) by mouth daily.  Dispense: 30 tablet; Refill: 2  2. Eczema, unspecified type  - triamcinolone ointment (KENALOG) 0.5 %; Apply 1 Application topically 2 (two) times daily.  Dispense: 30 g; Refill: 0    Follow up:  Follow up in 4 weeks

## 2022-11-10 NOTE — Progress Notes (Signed)
@Patient  ID: Sharon Hunt, female    DOB: 08-14-1970, 52 y.o.   MRN: [id number]  Chief Complaint  Patient presents with   Anxiety    Referring provider: Ivonne Andrew, NP   HPI   Patient presents today for anxiety and depression.  She is currently going through counseling with Darl Pikes here in the office.  She would like to be restarted on medication.  She states that Paxil has worked well for her in the past.  We will start her on this at a low dose and follow-up with her in 1 month.  Blood pressure is noted to be elevated in office but patient states that she does have whitecoat syndrome and is very anxious being in the office today.  We will have her check her blood pressure at home. Denies f/c/s, n/v/d, hemoptysis, PND, leg swelling Denies chest pain or edema      Allergies  Allergen Reactions   Coconut Flavor Itching     There is no immunization history on file for this patient.  Past Medical History:  Diagnosis Date   GERD (gastroesophageal reflux disease)    H/O hiatal hernia    Hyperlipidemia    Hypertension    Type 2 diabetes mellitus with chronic painful diabetic neuropathy (HCC) 12/20/2021    Tobacco History: Social History   Tobacco Use  Smoking Status Every Day   Current packs/day: 1.00   Types: Cigarettes  Smokeless Tobacco Not on file   Ready to quit: Not Answered Counseling given: Not Answered   Outpatient Encounter Medications as of 11/10/2022  Medication Sig   PARoxetine (PAXIL) 10 MG tablet Take 1 tablet (10 mg total) by mouth daily.   triamcinolone ointment (KENALOG) 0.5 % Apply 1 Application topically 2 (two) times daily.   aluminum-magnesium hydroxide-simethicone (MAALOX) 200-200-20 MG/5ML SUSP Take 30 mLs by mouth in the morning and at bedtime.   amLODipine (NORVASC) 5 MG tablet Take 1 tablet (5 mg total) by mouth daily.   augmented betamethasone dipropionate (DIPROLENE-AF) 0.05 % ointment APPLY TOPICALLY TWICE DAILY   Continuous  Glucose Sensor (FREESTYLE LIBRE 3 SENSOR) MISC Place 1 sensor on the skin every 14 days. Use to check glucose continuously   folic acid (FOLVITE) 1 MG tablet Take 1 tablet (1 mg total) by mouth daily.   gabapentin (NEURONTIN) 300 MG capsule Take 1 capsule (300 mg total) by mouth 3 (three) times daily. (Patient not taking: Reported on 10/20/2022)   hydrocerin (EUCERIN) CREA Apply 1 application. topically 3 (three) times daily.   hydrOXYzine (ATARAX) 25 MG tablet Take 1 tablet (25 mg total) by mouth 3 (three) times daily as needed for anxiety.   ketoconazole (NIZORAL) 2 % cream Apply 1 Application topically daily.   lisinopril (ZESTRIL) 40 MG tablet Take 1 tablet (40 mg total) by mouth daily.   metFORMIN (GLUCOPHAGE-XR) 500 MG 24 hr tablet TAKE 1 Tablet BY MOUTH ONCE EVERY DAY WITH BREAKFAST   Multiple Vitamin (MULTIVITAMIN WITH MINERALS) TABS tablet Take 1 tablet by mouth daily.   nicotine (NICODERM CQ - DOSED IN MG/24 HOURS) 21 mg/24hr patch Place 1 patch (21 mg total) onto the skin daily. (Patient not taking: Reported on 10/20/2022)   rosuvastatin (CRESTOR) 20 MG tablet Take 1 tablet (20 mg total) by mouth daily.   thiamine (VITAMIN B1) 100 MG tablet Take 1 tablet (100 mg total) by mouth daily. (Patient not taking: Reported on 10/20/2022)   No facility-administered encounter medications on file as of 11/10/2022.  Review of Systems  Review of Systems  Constitutional: Negative.   HENT: Negative.    Cardiovascular: Negative.   Gastrointestinal: Negative.   Allergic/Immunologic: Negative.   Neurological: Negative.   Psychiatric/Behavioral: Negative.         Physical Exam  BP (!) 213/113 (BP Location: Left Arm, Patient Position: Sitting, Cuff Size: Normal)   Pulse 98   SpO2 99%   Wt Readings from Last 5 Encounters:  10/20/22 158 lb (71.7 kg)  07/09/22 158 lb 3.2 oz (71.8 kg)  04/22/22 155 lb (70.3 kg)  03/24/22 154 lb (69.9 kg)  01/17/22 152 lb 12.8 oz (69.3 kg)     Physical  Exam Vitals and nursing note reviewed.  Constitutional:      General: She is not in acute distress.    Appearance: She is well-developed.  Cardiovascular:     Rate and Rhythm: Normal rate and regular rhythm.  Pulmonary:     Effort: Pulmonary effort is normal.     Breath sounds: Normal breath sounds.  Neurological:     Mental Status: She is alert and oriented to person, place, and time.      Lab Results:  CBC    Component Value Date/Time   WBC 10.3 07/09/2022 0917   WBC 11.7 (H) 07/30/2021 0149   RBC 5.24 07/09/2022 0917   RBC 3.75 (L) 07/30/2021 0149   HGB 16.2 (H) 07/09/2022 0917   HCT 48.4 (H) 07/09/2022 0917   PLT 161 07/09/2022 0917   MCV 92 07/09/2022 0917   MCH 30.9 07/09/2022 0917   MCH 36.0 (H) 07/30/2021 0149   MCHC 33.5 07/09/2022 0917   MCHC 37.5 (H) 07/30/2021 0149   RDW 15.5 (H) 07/09/2022 0917   LYMPHSABS 2.4 07/30/2021 0149   MONOABS 0.9 07/30/2021 0149   EOSABS 0.2 07/30/2021 0149   BASOSABS 0.1 07/30/2021 0149    BMET    Component Value Date/Time   NA 140 07/09/2022 0917   K 4.0 07/09/2022 0917   CL 97 07/09/2022 0917   CO2 23 07/09/2022 0917   GLUCOSE 105 (H) 07/09/2022 0917   GLUCOSE 121 (H) 07/30/2021 0149   BUN 13 07/09/2022 0917   CREATININE 0.85 07/09/2022 0917   CREATININE 0.75 04/04/2015 1206   CALCIUM 11.1 (H) 07/09/2022 0917   GFRNONAA >60 07/30/2021 0149   GFRNONAA >89 04/04/2015 1206   GFRAA >60 01/07/2019 0354   GFRAA >89 04/04/2015 1206     Assessment & Plan:   Anxiety - PARoxetine (PAXIL) 10 MG tablet; Take 1 tablet (10 mg total) by mouth daily.  Dispense: 30 tablet; Refill: 2  2. Eczema, unspecified type  - triamcinolone ointment (KENALOG) 0.5 %; Apply 1 Application topically 2 (two) times daily.  Dispense: 30 g; Refill: 0    Follow up:  Follow up in 4 weeks     Ivonne Andrew, NP 11/10/2022

## 2022-11-11 ENCOUNTER — Other Ambulatory Visit: Payer: Self-pay

## 2022-11-11 ENCOUNTER — Other Ambulatory Visit (HOSPITAL_COMMUNITY): Payer: Self-pay

## 2022-11-11 ENCOUNTER — Other Ambulatory Visit (HOSPITAL_BASED_OUTPATIENT_CLINIC_OR_DEPARTMENT_OTHER): Payer: Self-pay

## 2022-11-13 ENCOUNTER — Telehealth: Payer: Self-pay | Admitting: Clinical

## 2022-11-13 NOTE — Telephone Encounter (Addendum)
Integrated Behavioral Health Progress Note  11/13/2022 Name: [name] MRN: [id number] DOB: 1970/11/06 Sharon Hunt is a 52 y.o. year old female who sees Ivonne Andrew, NP for primary care. LCSW was initially consulted to assist the patient with [employer]  and Financial Difficulties related to lack of income and lack of health coverage.   Interpreter: No.   Interpreter Name & Language: none  Assessment: Patient experiencing financial difficulties related to lack of health coverage and income. She is also experiencing mental health concerns.   Ongoing Intervention: Patient called CSW before, during, and after trip to [facility] psychiatry evaluation appointment. She was anxious about the transportation there, and then requested help finding the suite the office was in when she got to the building. She reported when she got back home about how the appointment went. Provided emotional validation and reflective listening for patient.   She did also attend eviction court yesterday. She seems to be working on trying to find some funds to pay a rent bond, but did not want to discuss further today.   [name], LCSW [facility] [phone]

## 2022-11-18 ENCOUNTER — Telehealth: Payer: Self-pay | Admitting: Clinical

## 2022-11-18 NOTE — Telephone Encounter (Signed)
Integrated Behavioral Health Progress Note  11/18/2022 Name: Sharon Hunt MRN: [id number] DOB: [birth year] Sharon Hunt is a 52 y.o. year old female who sees Ivonne Andrew, NP for primary care. LCSW was initially consulted to assist the patient with [employer]  and Financial Difficulties related to lack of income and lack of health coverage.   Interpreter: No.   Interpreter Name & Language: none  Assessment: Patient experiencing financial difficulties related to lack of health coverage and income. She is also experiencing mental health concerns.   Ongoing Intervention: Patient called and discussed her upcoming psychiatry appointment and court date. Unfortunately they are scheduled on the same day, 11/24/22. She is not sure what time either one is though. Called [employer] and confirmed her appointment is at 2:15pm that day. Left voice mail for patient's Legal Aid attorney and requested he call patient with the time for her court date. CSW to follow and assist with rescheduling one if needed.  [name], LCSW Patient [facility] [phone]

## 2022-11-19 ENCOUNTER — Telehealth: Payer: Self-pay | Admitting: Clinical

## 2022-11-19 ENCOUNTER — Other Ambulatory Visit: Payer: Self-pay

## 2022-11-19 NOTE — Telephone Encounter (Signed)
Integrated Behavioral Health Progress Note  11/19/2022 Name: Sharon Hunt MRN: [id number] DOB: May 14, 1970 Sharon Hunt is a 52 y.o. year old female who sees Ivonne Andrew, NP for primary care. LCSW was initially consulted to assist the patient with [employer]  and Financial Difficulties related to lack of income and lack of health coverage.   Interpreter: No.   Interpreter Name & Language: none  Assessment: Patient experiencing financial difficulties related to lack of health coverage and income. She is also experiencing mental health concerns.   Ongoing Intervention: Patient called and vented about frustrations with the eviction court process. She is in touch with her attorney, but does still need to confirm with him what time her court date on 11/24/22 is. Provided brief supportive counseling for patient around this.  [name], LCSW Patient Care Center [facility] [phone]

## 2022-11-20 ENCOUNTER — Telehealth: Payer: Self-pay | Admitting: Clinical

## 2022-11-20 NOTE — Telephone Encounter (Signed)
Integrated Behavioral Health Progress Note  11/20/2022 Name: WHITTLEY JURKIEWICZ MRN: [id number] DOB: 1971/02/19 KALYCE BALEK is a 52 y.o. year old female who sees Ivonne Andrew, NP for primary care. LCSW was initially consulted to assist the patient with [employer]  and Financial Difficulties related to lack of income and lack of health coverage.   Interpreter: No.   Interpreter Name & Language: none  Assessment: Patient experiencing financial difficulties related to lack of health coverage and income. She is also experiencing mental health concerns.   Ongoing Intervention: Patient called and asked for assistance in finding help with moving costs. She is not sure what will happen in her upcoming eviction court date, but anticipates she will have to leave her current home. Advised patient that there are churches in the area that may be able to help with moving costs. CSW looking into these options, as another patient a few weeks ago was able to receive some assistance with moving.  Patient has eviction court on 11/24/22, though is not sure what time. She has a psychiatry appointment that same day at 2:15. Advised patient to find out from her attorney what time court is to determine if one needs to rescheduled or not.   [name], LCSW Patient Care Center [facility] [place] [facility] [phone]

## 2022-11-21 ENCOUNTER — Telehealth: Payer: Self-pay | Admitting: Clinical

## 2022-11-21 NOTE — Telephone Encounter (Addendum)
Integrated Behavioral Health Progress Note  11/21/2022 Name: Sharon Hunt MRN: [id number] DOB: 24-Jun-1970 Sharon Hunt is a 52 y.o. year old female who sees Ivonne Andrew, NP for primary care. LCSW was initially consulted to assist the patient with [employer]  and Financial Difficulties related to lack of income and lack of health coverage.   Interpreter: No.   Interpreter Name & Language: none  Assessment: Patient experiencing financial difficulties related to lack of health coverage and income. She is also experiencing mental health concerns.   Ongoing Intervention: Called patient and advised her of a church that provided assistance with moving costs for another patient a few months ago. Provided their contact information.   Patient then called back shortly afterwards and had her Legal Aid attorney on the line. He explained that they were able to have her eviction postponed for a bit, but now her rent bond is due this coming Monday 11/24/22. She does not actually have a court date that day. She would just meet her attorney at the court house to pay her rent bond if she is able to. However patient does not have the money for this and has no money to make ongoing rent payments. If the rent bond is not paid, the landlord would be able to enter a writ of possession on 9/10, meaning the home would likely be padlocked by the sheriff later next week.   Patient is planning to try and arrange a moving company and storage unit, because she would like to keep her belongings if possible even though she'll have to leave her home. CSW provided shelter information for patient and will be available to make calls to assist patient in finding a shelter bed if needed.  [name], LCSW Patient Care Center [facility] [phone]

## 2022-11-24 ENCOUNTER — Other Ambulatory Visit (HOSPITAL_COMMUNITY): Payer: Self-pay

## 2022-11-24 ENCOUNTER — Telehealth: Payer: Self-pay | Admitting: Clinical

## 2022-11-24 DIAGNOSIS — F251 Schizoaffective disorder, depressive type: Secondary | ICD-10-CM | POA: Diagnosis not present

## 2022-11-24 DIAGNOSIS — F411 Generalized anxiety disorder: Secondary | ICD-10-CM | POA: Diagnosis not present

## 2022-11-24 MED ORDER — VRAYLAR 1.5 MG PO CAPS
1.5000 mg | ORAL_CAPSULE | Freq: Every day | ORAL | 0 refills | Status: DC
Start: 2022-11-24 — End: 2023-02-17
  Filled 2022-11-24: qty 30, 30d supply, fill #0

## 2022-11-24 NOTE — Telephone Encounter (Addendum)
Integrated Behavioral Health Progress Note  11/24/2022 Name: Sharon Hunt MRN: [id number] DOB: 11-Mar-1971 Sharon Hunt is a 52 y.o. year old female who sees Sharon Andrew, NP for primary care. LCSW was initially consulted to assist the patient with [employer]  and Financial Difficulties related to lack of income and lack of health coverage.   Interpreter: No.   Interpreter Name & Language: none  Assessment: Patient experiencing financial difficulties related to lack of health coverage and income. She is also experiencing mental health concerns.   Ongoing Intervention: Patient called and reported she has been able to get some assistance from her family and friends with a moving truck and storage unit so she can move from her home later this week. As she wasn't able to come up with the money for a rent bond, the eviction will proceed.   Did check in with patient again after her virtual psychiatry appointment with [facility]. She attended the appointment and reported that the psychiatrist diagnosed her with schizophrenia. Based on CSW assessment thus far, I would not agree with that diagnosis, as patient has not shown delusions, hallucinations, or the negative symptoms of schizophrenia. She does have some disorganized speech and it can be difficult for her to remember things, but she does not seem to meet other criteria for a psychotic disorder. Patient has been taking the Paxil prescribed by her PCP at last visit and understands that the medication will take some time to start working.  She indicated she plans to call CSW tomorrow. She is continuing to work on getting her belongings moved from her home. She would like CSW's assistance in finding a shelter, though has not been able to focus on that yet with getting her things moved to storage.   Sharon Butts, LCSW Patient Care Center [facility] [phone]

## 2022-11-25 ENCOUNTER — Telehealth: Payer: Self-pay | Admitting: Clinical

## 2022-11-25 NOTE — Telephone Encounter (Addendum)
Integrated Behavioral Health Progress Note  11/25/2022 Name: Sharon Hunt MRN: [id number] DOB: January 25, 1971 Sharon Hunt is a 52 y.o. year old female who sees Sharon Andrew, NP for primary care. LCSW was initially consulted to assist the patient with [employer]  and Financial Difficulties related to lack of income and lack of health coverage.   Interpreter: No.   Interpreter Name & Language: none  Assessment: Patient experiencing financial difficulties related to lack of health coverage and income. She is also experiencing mental health concerns.   Ongoing Intervention: Returned missed call from patient and she had a representative from [employer] [facility] also on the phone. [facility] rep provided information on their coordinated entry process and advised on where/when patient can go for intake. Patient asked all questions she had of the rep.  After [facility] rep hung up, reiterated the information they had shared with patient. Continued to discuss shelter options with patient. CSW called some area shelters. At patient's request, sent her the [facility] information and shelter information via email. Placed patient on [facility] wait list. Spoke to housing Production designer, theatre/television/film at [employer] and she advised patient call directly to get put on their list. Left voice mails for [employer], [employer], and [employer]. Advised patient she can also just go to the [facility]) without needing to call ahead.   Also conducted risk assessment, as patient has referenced thoughts of suicide. Patient denied having thoughts of suicide today. We were able to plan for safety. Patient agreed that if thoughts of suicide return, she will call her friend Sharon Hunt in [place] to talk, or she will call the [facility]) crisis line [phone]. Sent the crisis line information to patient via email, also at her request.   Sharon Butts, LCSW Patient Care Center [facility] [phone]

## 2022-11-28 ENCOUNTER — Telehealth: Payer: Self-pay | Admitting: Clinical

## 2022-11-28 NOTE — Telephone Encounter (Addendum)
Integrated Behavioral Health Progress Note  11/28/2022 Name: ALYZABETH WENKER MRN: [id number] DOB: February 12, 1971 EVARISTA REICHENBERGER is a 52 y.o. year old female who sees Ivonne Andrew, NP for primary care. LCSW was initially consulted to assist the patient with [employer]  and Financial Difficulties related to lack of income and lack of health coverage.   Interpreter: No.   Interpreter Name & Language: none  Assessment: Patient experiencing financial difficulties related to lack of health coverage and income. She is also experiencing mental health concerns.   Ongoing Intervention: Patient called and discussed updates with her shelter search. She has not found a shelter bed yet. Advised patient she can go to the [facility]) for overnight shelter and they also offer safe parking for people who are sleeping in their vehicles. She would prefer not to go to a shelter in [facility] because she wants to be near here in case there is an emergency with her grandson. She wouldn't have the gas money to get back here to [place]. Reminded patient of the [employer] [facility] coordinated entry date on 9/17; she can meet them at [facility] to do an intake and start the process to get on some housing wait lists.   [name], LCSW Patient Care Center [facility] [phone]

## 2022-12-02 ENCOUNTER — Other Ambulatory Visit (HOSPITAL_COMMUNITY): Payer: Self-pay

## 2022-12-03 ENCOUNTER — Telehealth: Payer: Self-pay | Admitting: Clinical

## 2022-12-03 NOTE — Telephone Encounter (Signed)
Integrated Behavioral Health Progress Note  12/02/22 Name: Sharon Hunt MRN: [id number] DOB: 07/29/1970 RAIGAN BOGA is a 52 y.o. year old female who sees Ivonne Andrew, NP for primary care. LCSW was initially consulted to assist the patient with [employer]  and Financial Difficulties related to lack of income and lack of health coverage.   Interpreter: No.   Interpreter Name & Language: none  Assessment: Patient experiencing financial difficulties related to lack of health coverage and income. She is also experiencing mental health concerns.   Ongoing Intervention: CSW and patient missed each other's calls 9/16 and today, 12/02/22. Patient left voice mail for CSW indicating that she'd likely have to be out of her home tomorrow and she had continued to call shelters and the [facility]). Left voice mail back for patient that she does not need to call the [facility] ahead of time, she can just go there in person. Advised that they have case workers there and are open overnight, though do not have beds/cots. They also operate a safe parking area for people sleeping in their vehicles.   [name], LCSW Patient Care Center [facility] [phone]

## 2022-12-04 ENCOUNTER — Telehealth: Payer: Self-pay | Admitting: Clinical

## 2022-12-04 NOTE — Telephone Encounter (Signed)
Integrated Behavioral Health Progress Note  12/04/2022 Name: Sharon Hunt MRN: [id number] DOB: 1970/09/09 Sharon Hunt is a 52 y.o. year old female who sees Ivonne Andrew, NP for primary care. LCSW was initially consulted to assist the patient with [employer]  and Financial Difficulties related to lack of income and lack of health coverage.   Interpreter: No.   Interpreter Name & Language: none  Assessment: Patient experiencing financial difficulties related to lack of health coverage and income. She is also experiencing mental health concerns.   Ongoing Intervention: Patient called CSW and reported that she was padlocked out of her home yesterday. She and her adult son stayed in a motel last night. They have been considering patient moving back up to [place], where she is from, but haven't found anyone she could stay with up there. Her son Sharon Hunt did get on the phone and discuss this plan with CSW. Patient also had questions about if she were to move, what would happen with her Medicaid and her pending disability application. Advised patient that she would need to apply for new insurance in [place], but her disability process could continue, she'd just need to advise Social Security that she had moved.   Patient has been calling shelters and has not found a bed yet. She has also called the [facility] several times. Advised patient to just go to the [facility] in person if she wants to get services there. Reminded her that they have laundry, showers, and caseworkers there. They don't have beds, but are open overnight. Patient indicated she may try their safe parking option if needed.  Reminded patient of some possibly promising shelter options in [employer] [facility] and [employer] and provided her with these phone numbers. CSW had already placed patient on [facility] waiting list.   [name], LCSW Patient Care Center [facility]  Group [phone]

## 2022-12-05 ENCOUNTER — Telehealth: Payer: Self-pay | Admitting: Clinical

## 2022-12-05 NOTE — Telephone Encounter (Addendum)
Integrated Behavioral Health Progress Note  12/05/2022 Name: Sharon Hunt MRN: [id number] DOB: 24-Oct-1970 [name] is a 52 y.o. year old female who sees Ivonne Andrew, NP for primary care. LCSW was initially consulted to assist the patient with [employer]  and Financial Difficulties related to lack of income and lack of health coverage.   Interpreter: No.   Interpreter Name & Language: none  Assessment: Patient experiencing financial difficulties related to lack of health coverage and income. She is also experiencing mental health concerns.   Ongoing Intervention: Patient called and reported her friend has paid for her to stay in a motel for a week. She is at the [employer] on [address] She said that one of the hotel staff members told her she could get a hotel voucher through social services, but she tried looking into it and wasn't able to get additional information. CSW is not aware of any hotel voucher programs still operating, though there were some during the height of the pandemic.   Patient has continued trying to call shelters but has not gotten a bed yet. She reports she is doing ok, has been reading her Bible. CSW is available from clinic as needed.  [name], LCSW Patient Care Center [facility] [phone]

## 2022-12-08 ENCOUNTER — Telehealth: Payer: Self-pay | Admitting: Clinical

## 2022-12-08 ENCOUNTER — Ambulatory Visit: Payer: Medicaid Other | Admitting: Nurse Practitioner

## 2022-12-08 NOTE — Telephone Encounter (Addendum)
Integrated Behavioral Health Progress Note  12/08/2022 Name: Sharon Hunt MRN: [id number] DOB: 11/13/1970 Sharon Hunt is a 52 y.o. year old female who sees Ivonne Andrew, NP for primary care. LCSW was initially consulted to assist the patient with [employer]  and Financial Difficulties related to lack of income and lack of health coverage.   Interpreter: No.   Interpreter Name & Language: none  Assessment: Patient experiencing financial difficulties related to lack of health coverage and income. She is also experiencing mental health concerns.   Ongoing Intervention: Patient called CSW and said she won't be able to make it to her PCP appointment today because she does not have gas and we did not arrange transportation ahead of time. Rescheduled patient's appointment for 12/11/22 and will arrange taxi for patient.  She also requested a referral for [employer]. Advised that they have a wait list as well, like other resources we have been looking at. CSW left voice mail with [facility] requesting updated application packet and info on making a referral. Per previous application requirements, patient would need income to be eligible. Will check with [facility] to see if patient can still apply since her disability is pending.  [name], LCSW Patient Care Center [facility] [phone]

## 2022-12-09 ENCOUNTER — Telehealth: Payer: Self-pay | Admitting: Clinical

## 2022-12-09 NOTE — Telephone Encounter (Signed)
Integrated Behavioral Health Progress Note  12/09/2022 Name: Sharon Hunt MRN: [id number] DOB: 1970-08-23 Sharon Hunt is a 52 y.o. year old female who sees Ivonne Andrew, NP for primary care. LCSW was initially consulted to assist the patient with [employer]  and Financial Difficulties related to lack of income and lack of health coverage.   Interpreter: No.   Interpreter Name & Language: none  Assessment: Patient experiencing financial difficulties related to lack of health coverage and income. She is also experiencing mental health concerns.   Ongoing Intervention: CSW called patient and updated her on [place] application process. Patient will need to bring a referral from CSW over to [facility] and pick up an application.   Patient is coming here to [facility] for PCP follow up on 9/26. She can pick up CSW referral letter while here. CSW to arrange transportation to that appointment and to [facility] on the same day.  [name], LCSW Patient Care Center [facility] [phone]

## 2022-12-10 ENCOUNTER — Telehealth: Payer: Self-pay | Admitting: Clinical

## 2022-12-10 ENCOUNTER — Other Ambulatory Visit (HOSPITAL_COMMUNITY): Payer: Self-pay

## 2022-12-10 DIAGNOSIS — F251 Schizoaffective disorder, depressive type: Secondary | ICD-10-CM | POA: Diagnosis not present

## 2022-12-10 DIAGNOSIS — F411 Generalized anxiety disorder: Secondary | ICD-10-CM | POA: Diagnosis not present

## 2022-12-10 MED ORDER — VRAYLAR 3 MG PO CAPS
3.0000 mg | ORAL_CAPSULE | Freq: Every day | ORAL | 0 refills | Status: DC
Start: 2022-12-10 — End: 2023-02-17
  Filled 2022-12-10: qty 30, 30d supply, fill #0

## 2022-12-10 NOTE — Telephone Encounter (Signed)
Integrated Behavioral Health Progress Note  12/10/2022 Name: Sharon Hunt MRN: [id number] DOB: [birth year] Sharon Hunt is a 52 y.o. year old female who sees Ivonne Andrew, NP for primary care. LCSW was initially consulted to assist the patient with [employer]  and Financial Difficulties related to lack of income and lack of health coverage.   Interpreter: No.   Interpreter Name & Language: none  Assessment: Patient experiencing financial difficulties related to lack of health coverage and income. She is also experiencing mental health concerns.   Ongoing Intervention: Patient called CSW and reported she has her follow up appointment with [facility] today. She is also considering a residential substance use recovery program. She has a history of alcohol abuse and has mostly been in recovery, with some relapses. She is finding the environment at the motel triggering for possible relapse.  Emailed patient information on recovery programs. Also included details of her transportation for tomorrow to come to her PCP appointment and go to [facility].  [name], LCSW Patient Care Center [facility] [phone]

## 2022-12-11 ENCOUNTER — Ambulatory Visit (INDEPENDENT_AMBULATORY_CARE_PROVIDER_SITE_OTHER): Payer: Medicaid Other | Admitting: Nurse Practitioner

## 2022-12-11 ENCOUNTER — Encounter: Payer: Self-pay | Admitting: Nurse Practitioner

## 2022-12-11 VITALS — BP 152/85 | HR 96 | Ht 65.0 in | Wt 160.0 lb

## 2022-12-11 DIAGNOSIS — F419 Anxiety disorder, unspecified: Secondary | ICD-10-CM

## 2022-12-11 DIAGNOSIS — F32A Depression, unspecified: Secondary | ICD-10-CM

## 2022-12-11 DIAGNOSIS — F1010 Alcohol abuse, uncomplicated: Secondary | ICD-10-CM

## 2022-12-11 NOTE — Progress Notes (Signed)
Subjective   Patient ID: Sharon Hunt, female    DOB: 05/14/1970, 52 y.o.   MRN: [id number]  Chief Complaint  Patient presents with   Hypertension    HTN & FM f/u.  No questions / concerns    Referring provider: Ivonne Andrew, NP  Sharon Hunt is a 52 y.o. female with Past Medical History: No date: GERD (gastroesophageal reflux disease) No date: H/O hiatal hernia No date: Hyperlipidemia No date: Hypertension 12/20/2021: Type 2 diabetes mellitus with chronic painful diabetic  neuropathy (HCC)   HPI  Patient presents today for anxiety and depression.  She is currently going through counseling with Darl Pikes here in the office.  Blood pressure is noted to be elevated in office but patient states that she does have whitecoat syndrome and is very anxious being in the office today.  We will have her check her blood pressure at home.  Patient is working with Darl Pikes for social work as well.  She is currently homeless.  Darl Pikes did provide her with information for substance abuse counseling as well.  Patient states that she that she has become homeless again as she has started drinking.  She has went through detox before and would like to do this again. denies f/c/s, n/v/d, hemoptysis, PND, leg swelling      Allergies  Allergen Reactions   Coconut Flavor Itching     There is no immunization history on file for this patient.  Tobacco History: Social History   Tobacco Use  Smoking Status Every Day   Current packs/day: 1.00   Types: Cigarettes  Smokeless Tobacco Not on file   Ready to quit: Not Answered Counseling given: Not Answered   Outpatient Encounter Medications as of 12/11/2022  Medication Sig   aluminum-magnesium hydroxide-simethicone (MAALOX) 200-200-20 MG/5ML SUSP Take 30 mLs by mouth in the morning and at bedtime.   amLODipine (NORVASC) 5 MG tablet Take 1 tablet (5 mg total) by mouth daily.   Continuous Glucose Sensor (FREESTYLE LIBRE 3 SENSOR) MISC Place 1  sensor on the skin every 14 days. Use to check glucose continuously   hydrocerin (EUCERIN) CREA Apply 1 application. topically 3 (three) times daily.   hydrOXYzine (ATARAX) 25 MG tablet Take 1 tablet (25 mg total) by mouth 3 (three) times daily as needed for anxiety.   ketoconazole (NIZORAL) 2 % cream Apply 1 Application topically daily.   lisinopril (ZESTRIL) 40 MG tablet Take 1 tablet (40 mg total) by mouth daily.   metFORMIN (GLUCOPHAGE-XR) 500 MG 24 hr tablet TAKE 1 Tablet BY MOUTH ONCE EVERY DAY WITH BREAKFAST   Multiple Vitamin (MULTIVITAMIN WITH MINERALS) TABS tablet Take 1 tablet by mouth daily.   PARoxetine (PAXIL) 10 MG tablet Take 1 tablet (10 mg total) by mouth daily.   augmented betamethasone dipropionate (DIPROLENE-AF) 0.05 % ointment APPLY TOPICALLY TWICE DAILY   cariprazine (VRAYLAR) 1.5 MG capsule Take 1 capsule (1.5 mg total) by mouth daily. (Patient not taking: Reported on 12/11/2022)   cariprazine (VRAYLAR) 3 MG capsule Take 1 capsule (3 mg total) by mouth daily. (Patient not taking: Reported on 12/11/2022)   folic acid (FOLVITE) 1 MG tablet Take 1 tablet (1 mg total) by mouth daily.   gabapentin (NEURONTIN) 300 MG capsule Take 1 capsule (300 mg total) by mouth 3 (three) times daily. (Patient not taking: Reported on 10/20/2022)   nicotine (NICODERM CQ - DOSED IN MG/24 HOURS) 21 mg/24hr patch Place 1 patch (21 mg total) onto the skin daily. (Patient not  taking: Reported on 10/20/2022)   rosuvastatin (CRESTOR) 20 MG tablet Take 1 tablet (20 mg total) by mouth daily.   thiamine (VITAMIN B1) 100 MG tablet Take 1 tablet (100 mg total) by mouth daily. (Patient not taking: Reported on 10/20/2022)   triamcinolone ointment (KENALOG) 0.5 % Apply 1 Application topically 2 (two) times daily.   No facility-administered encounter medications on file as of 12/11/2022.    Review of Systems  Review of Systems  Constitutional: Negative.   HENT: Negative.    Cardiovascular: Negative.    Gastrointestinal: Negative.   Allergic/Immunologic: Negative.   Neurological: Negative.   Psychiatric/Behavioral: Negative.       Objective:   BP (!) 152/85 (BP Location: Left Arm, Patient Position: Sitting, Cuff Size: Normal)   Pulse 96   Ht 5\' 5"  (1.651 m)   Wt 160 lb (72.6 kg)   SpO2 98%   BMI 26.63 kg/m   Wt Readings from Last 5 Encounters:  12/11/22 160 lb (72.6 kg)  10/20/22 158 lb (71.7 kg)  07/09/22 158 lb 3.2 oz (71.8 kg)  04/22/22 155 lb (70.3 kg)  03/24/22 154 lb (69.9 kg)     Physical Exam Vitals and nursing note reviewed.  Constitutional:      General: She is not in acute distress.    Appearance: She is well-developed.  Cardiovascular:     Rate and Rhythm: Normal rate and regular rhythm.  Pulmonary:     Effort: Pulmonary effort is normal.     Breath sounds: Normal breath sounds.  Neurological:     Mental Status: She is alert and oriented to person, place, and time.       Assessment & Plan:   Alcohol abuse  Anxiety and depression    Patient Instructions  1. Alcohol abuse  -Handout provided to patient with a local rehab centers.  Patient states that she will contact one of the centers.  2. Anxiety and depression  -Continue to follow with Darl Pikes for counseling and for assistance with current homeless situation.  Follow up:  Follow up in 1 months     No follow-ups on file.   Ivonne Andrew, NP 12/11/2022

## 2022-12-11 NOTE — Patient Instructions (Addendum)
1. Alcohol abuse  -Handout provided to patient with a local rehab centers.  Patient states that she will contact one of the centers.  2. Anxiety and depression  -Continue to follow with Darl Pikes for counseling and for assistance with current homeless situation.  Follow up:  Follow up in 1 months

## 2022-12-18 ENCOUNTER — Other Ambulatory Visit (HOSPITAL_COMMUNITY): Payer: Self-pay

## 2022-12-19 ENCOUNTER — Telehealth: Payer: Self-pay | Admitting: Clinical

## 2022-12-19 NOTE — Telephone Encounter (Addendum)
Integrated Behavioral Health Progress Note  12/19/2022 Name: Sharon Hunt MRN: [id number] DOB: 11-11-1970 Sharon Hunt is a 52 y.o. year old female who sees Ivonne Andrew, NP for primary care. LCSW was initially consulted to assist the patient with [employer]  and Financial Difficulties related to lack of income and lack of health coverage.   Interpreter: No.   Interpreter Name & Language: none  Assessment: Patient experiencing financial difficulties related to lack of health coverage and income. She is also experiencing mental health concerns.   Ongoing Intervention: Called patient to check in. She and her son have continued to stay in the motel. They have been borrowing money and patient's son also works so is able to contribute some.   She is still interested in going to a recovery treatment program, as she has relapsed into drinking again since experiencing the eviction and homelessness. She has called some of the facilities on the list we provided and has not found one with availability yet. CSW called [facility] in [place] today. They have a 9-12 week IOP program, with the option for transitional housing during that time. Patient is agreeable to referral to this program. CSW sent the referral and will plan to follow.   Sharon Butts, LCSW [facility] [phone]

## 2022-12-25 ENCOUNTER — Telehealth: Payer: Self-pay | Admitting: Clinical

## 2022-12-25 NOTE — Telephone Encounter (Addendum)
Integrated Behavioral Health Progress Note  12/25/2022 Name: Sharon Hunt MRN: [id number] DOB: [birth year] Sharon Hunt is a 52 y.o. year old female who sees Ivonne Andrew, NP for primary care. LCSW was initially consulted to assist the patient with [employer]  and Financial Difficulties related to lack of income and lack of health coverage.   Interpreter: No.   Interpreter Name & Language: none  Assessment: Patient experiencing financial difficulties related to lack of health coverage and income. She is also experiencing mental health concerns.   Ongoing Intervention: CSW called patient to check in. She continues to look for substance use treatment facilities. She hasn't heard back from [facility] yet; CSW sent referral there last week. Advised patient that [facility] in [place] also has availability and they are a longer term program. Patient was hesitant about this because she still wants to be able to see [facility] and wouldn't be able to do that if she went to [facility]. Provided supportive counseling around this.  She has a virtual appointment with psychiatrist at [facility] tomorrow that she plans to attend.   Sharon Butts, LCSW [facility] [phone]

## 2022-12-26 ENCOUNTER — Other Ambulatory Visit (HOSPITAL_COMMUNITY): Payer: Self-pay

## 2022-12-26 ENCOUNTER — Telehealth: Payer: Self-pay | Admitting: Clinical

## 2022-12-26 DIAGNOSIS — F251 Schizoaffective disorder, depressive type: Secondary | ICD-10-CM | POA: Diagnosis not present

## 2022-12-26 DIAGNOSIS — F411 Generalized anxiety disorder: Secondary | ICD-10-CM | POA: Diagnosis not present

## 2022-12-26 MED ORDER — VRAYLAR 3 MG PO CAPS
3.0000 mg | ORAL_CAPSULE | Freq: Every day | ORAL | 0 refills | Status: DC
  Filled 2022-12-26: qty 30, 30d supply, fill #0

## 2022-12-26 MED ORDER — HYDROXYZINE HCL 25 MG PO TABS
25.0000 mg | ORAL_TABLET | Freq: Three times a day (TID) | ORAL | 2 refills | Status: DC
  Filled 2022-12-26: qty 90, 30d supply, fill #0

## 2022-12-26 MED ORDER — PAROXETINE HCL 10 MG PO TABS
10.0000 mg | ORAL_TABLET | Freq: Every day | ORAL | 3 refills | Status: DC
  Filled 2022-12-26: qty 30, 30d supply, fill #0

## 2022-12-26 NOTE — Telephone Encounter (Signed)
Integrated Behavioral Health Progress Note  12/26/2022 Name: Sharon Hunt MRN: [id number] DOB: 1970-03-21 Sharon Hunt is a 52 y.o. year old female who sees Ivonne Andrew, NP for primary care. LCSW was initially consulted to assist the patient with [employer]  and Financial Difficulties related to lack of income and lack of health coverage.   Interpreter: No.   Interpreter Name & Language: none  Assessment: Patient experiencing financial difficulties related to lack of health coverage and income. She is also experiencing mental health concerns.   Ongoing Intervention: CSW did call [facility] to follow up on referral made for patient last week, left voice mail and awaiting call back. Called patient and followed up. She reported she'd made an initial call to [facility] and they're going to call her back for intake. She had her psychiatry appointment with [facility] today and has another follow up on 10/29. It appears that psychiatrist is now prescribing patient's Paxil. Patient reports she's not feeling as stressed as she used to, and is just taking things day by day. She has continued to reach out to community resources such as the [facility]; she reports that their safe parking is currently full.  She did also report she's experiencing indigestion and asked for something to help with this. CSW sent message to PCP with this request.   [name], LCSW [facility] [phone]

## 2022-12-29 ENCOUNTER — Telehealth: Payer: Self-pay | Admitting: Clinical

## 2022-12-29 NOTE — Telephone Encounter (Signed)
Integrated Behavioral Health Progress Note  12/29/2022 Name: EMMA BIRCHLER MRN: [id number] DOB: Aug 31, 1970 ZHURI KRASS is a 52 y.o. year old female who sees Ivonne Andrew, NP for primary care. LCSW was initially consulted to assist the patient with [employer]  and Financial Difficulties related to lack of income and lack of health coverage.   Interpreter: No.   Interpreter Name & Language: none  Assessment: Patient experiencing financial difficulties related to lack of health coverage and income. She is also experiencing mental health concerns.   Ongoing Intervention: Patient called CSW and reported that she spoke to [facility] and went through an intake phone call with them, but then they advised that she is not a candidate for their program. She is continuing to look for substance use recovery programs.   She has left the motel she was staying at. A staff member at the motel told her that social services helps with hotel vouchers and patient is looking into this. CSW advised patient to meet [facility] at their coordinated entry point this week. CSW also sending referral to [facility].   Also emailed [facility] to follow up on referral sent 12/19/22 for their substance use recovery program.  [name], LCSW [facility] [phone]

## 2022-12-30 ENCOUNTER — Telehealth: Payer: Self-pay | Admitting: Clinical

## 2022-12-30 NOTE — Telephone Encounter (Addendum)
Integrated Behavioral Health Progress Note  12/30/2022 Name: Sharon Hunt MRN: [id number] DOB: 10-Mar-1971 Sharon Hunt is a 52 y.o. year old female who sees Ivonne Andrew, NP for primary care. LCSW was initially consulted to assist the patient with [employer]  and Financial Difficulties related to lack of income and lack of health coverage.   Interpreter: No.   Interpreter Name & Language: none  Assessment: Patient experiencing financial difficulties related to lack of health coverage and income. She is also experiencing mental health concerns.   Ongoing Intervention: Patient called this morning and reported that she had tried parking overnight at the [facility], as they offer for saf parking for people sleeping in their vehicles. However, she had a bad experience with the security guard there. She did get to speak to a pastor while there, but then ended up parking at [facility] overnight.   Patient called again this afternoon and reported that she had received a call from [employer]ability [employer] ([employer]) and they indicated that they are getting her case finished up. They indicated that she should receive a call in the next week to arrange how she will receive disability payments.   [name], LCSW [facility] [phone]

## 2022-12-31 ENCOUNTER — Other Ambulatory Visit: Payer: Self-pay | Admitting: Nurse Practitioner

## 2022-12-31 ENCOUNTER — Other Ambulatory Visit (HOSPITAL_COMMUNITY): Payer: Self-pay

## 2022-12-31 MED ORDER — OMEPRAZOLE 20 MG PO CPDR
20.0000 mg | DELAYED_RELEASE_CAPSULE | Freq: Every day | ORAL | 1 refills | Status: DC
  Filled 2022-12-31: qty 28, 28d supply, fill #0

## 2023-01-07 ENCOUNTER — Other Ambulatory Visit (HOSPITAL_COMMUNITY): Payer: Self-pay

## 2023-01-08 ENCOUNTER — Ambulatory Visit (INDEPENDENT_AMBULATORY_CARE_PROVIDER_SITE_OTHER): Payer: Medicaid Other | Admitting: Nurse Practitioner

## 2023-01-08 ENCOUNTER — Other Ambulatory Visit (HOSPITAL_COMMUNITY): Payer: Self-pay

## 2023-01-08 ENCOUNTER — Encounter: Payer: Self-pay | Admitting: Nurse Practitioner

## 2023-01-08 VITALS — BP 164/97 | HR 84 | Wt 164.8 lb

## 2023-01-08 DIAGNOSIS — I10 Essential (primary) hypertension: Secondary | ICD-10-CM | POA: Diagnosis not present

## 2023-01-08 DIAGNOSIS — L309 Dermatitis, unspecified: Secondary | ICD-10-CM

## 2023-01-08 DIAGNOSIS — E1140 Type 2 diabetes mellitus with diabetic neuropathy, unspecified: Secondary | ICD-10-CM

## 2023-01-08 LAB — POCT GLYCOSYLATED HEMOGLOBIN (HGB A1C): Hemoglobin A1C: 5.3 % (ref 4.0–5.6)

## 2023-01-08 MED ORDER — OMEPRAZOLE 20 MG PO CPDR
20.0000 mg | DELAYED_RELEASE_CAPSULE | Freq: Every day | ORAL | 0 refills | Status: DC
  Filled 2023-01-08: qty 90, 90d supply, fill #0

## 2023-01-08 MED ORDER — ROSUVASTATIN CALCIUM 20 MG PO TABS
20.0000 mg | ORAL_TABLET | Freq: Every day | ORAL | 11 refills | Status: DC
  Filled 2023-01-08: qty 30, 30d supply, fill #0

## 2023-01-08 MED ORDER — SILVER SULFADIAZINE 1 % EX CREA
1.0000 | TOPICAL_CREAM | Freq: Every day | CUTANEOUS | 0 refills | Status: DC
  Filled 2023-01-08: qty 50, 34d supply, fill #0

## 2023-01-08 MED ORDER — METFORMIN HCL ER 500 MG PO TB24
500.0000 mg | ORAL_TABLET | Freq: Every day | ORAL | 0 refills | Status: DC
  Filled 2023-01-08: qty 90, 90d supply, fill #0

## 2023-01-08 MED ORDER — AMLODIPINE BESYLATE 5 MG PO TABS
5.0000 mg | ORAL_TABLET | Freq: Every day | ORAL | 0 refills | Status: DC
  Filled 2023-01-08: qty 30, 30d supply, fill #0

## 2023-01-08 NOTE — Progress Notes (Signed)
Subjective   Patient ID: Sharon Hunt, female    DOB: 1970-11-25, 52 y.o.   MRN: [id number]  Chief Complaint  Patient presents with   Diabetes    Referring provider: Ivonne Andrew, NP  Sharon Hunt is a 52 y.o. female with Past Medical History: No date: GERD (gastroesophageal reflux disease) No date: H/O hiatal hernia No date: Hyperlipidemia No date: Hypertension 12/20/2021: Type 2 diabetes mellitus with chronic painful diabetic  neuropathy (HCC)   Diabetes    Patient presents today for follow-up visit for hypertension and diabetes.  Her blood pressure was elevated in office today. She was advised to watch blood pressure at home and make sure that she continues to take her medicine.  Patient gets very nervous going into doctors offices. Patient stated that she did not have food so we did get her food from the food pantry.  Diabetes is currently well controlled. A1C is at 5.3 today. Pharmacy is following with her for medication management for hypertension.  Denies f/c/s, n/v/d, hemoptysis, PND, leg swelling Denies chest pain or edema.   Patient complains today of chemical burns to bilateral feet after using foot mask at home. States that when she used the mask it caused her skin to have a burning sensation and now skin is peeling. Will trial silvadene cream and refer to podiatry.   Allergies  Allergen Reactions   Coconut Flavor Itching     There is no immunization history on file for this patient.  Tobacco History: Social History   Tobacco Use  Smoking Status Every Day   Current packs/day: 1.00   Types: Cigarettes  Smokeless Tobacco Not on file   Ready to quit: Not Answered Counseling given: Not Answered   Outpatient Encounter Medications as of 01/08/2023  Medication Sig   aluminum-magnesium hydroxide-simethicone (MAALOX) 200-200-20 MG/5ML SUSP Take 30 mLs by mouth in the morning and at bedtime.   augmented betamethasone dipropionate (DIPROLENE-AF)  0.05 % ointment APPLY TOPICALLY TWICE DAILY   cariprazine (VRAYLAR) 3 MG capsule Take one capsule (3 mg total) by mouth every day.   folic acid (FOLVITE) 1 MG tablet Take 1 tablet (1 mg total) by mouth daily.   gabapentin (NEURONTIN) 300 MG capsule Take 1 capsule (300 mg total) by mouth 3 (three) times daily.   hydrOXYzine (ATARAX) 25 MG tablet Take 1 tablet (25 mg total) by mouth 3 (three) times daily as needed for anxiety.   lisinopril (ZESTRIL) 40 MG tablet Take 1 tablet (40 mg total) by mouth daily.   Multiple Vitamin (MULTIVITAMIN WITH MINERALS) TABS tablet Take 1 tablet by mouth daily.   PARoxetine (PAXIL) 10 MG tablet Take 1 tablet (10 mg total) by mouth daily.   PARoxetine (PAXIL) 10 MG tablet Take 1 tablet (10 mg total) by mouth daily.   silver sulfADIAZINE (SILVADENE) 1 % cream Apply 1 Application topically daily.   triamcinolone ointment (KENALOG) 0.5 % Apply 1 Application topically 2 (two) times daily.   [DISCONTINUED] amLODipine (NORVASC) 5 MG tablet Take 1 tablet (5 mg total) by mouth daily.   [DISCONTINUED] hydrOXYzine (ATARAX) 25 MG tablet Take 1 tablet (25 mg total) by mouth 3 (three) times daily as needed for anxiety.   [DISCONTINUED] metFORMIN (GLUCOPHAGE-XR) 500 MG 24 hr tablet TAKE 1 Tablet BY MOUTH ONCE EVERY DAY WITH BREAKFAST   [DISCONTINUED] omeprazole (PRILOSEC) 20 MG capsule Take 1 capsule (20 mg total) by mouth daily.   [DISCONTINUED] rosuvastatin (CRESTOR) 20 MG tablet Take 1 tablet (20 mg  total) by mouth daily.   amLODipine (NORVASC) 5 MG tablet Take 1 tablet (5 mg total) by mouth daily.   cariprazine (VRAYLAR) 1.5 MG capsule Take 1 capsule (1.5 mg total) by mouth daily. (Patient not taking: Reported on 12/11/2022)   cariprazine (VRAYLAR) 3 MG capsule Take 1 capsule (3 mg total) by mouth daily. (Patient not taking: Reported on 12/11/2022)   Continuous Glucose Sensor (FREESTYLE LIBRE 3 SENSOR) MISC Place 1 sensor on the skin every 14 days. Use to check glucose  continuously   hydrocerin (EUCERIN) CREA Apply 1 application. topically 3 (three) times daily. (Patient not taking: Reported on 01/08/2023)   ketoconazole (NIZORAL) 2 % cream Apply 1 Application topically daily. (Patient not taking: Reported on 01/08/2023)   metFORMIN (GLUCOPHAGE-XR) 500 MG 24 hr tablet Take 1 tablet (500 mg total) by mouth daily with breakfast.   nicotine (NICODERM CQ - DOSED IN MG/24 HOURS) 21 mg/24hr patch Place 1 patch (21 mg total) onto the skin daily. (Patient not taking: Reported on 10/20/2022)   omeprazole (PRILOSEC) 20 MG capsule Take 1 capsule (20 mg total) by mouth daily.   rosuvastatin (CRESTOR) 20 MG tablet Take 1 tablet (20 mg total) by mouth daily.   thiamine (VITAMIN B1) 100 MG tablet Take 1 tablet (100 mg total) by mouth daily. (Patient not taking: Reported on 10/20/2022)   No facility-administered encounter medications on file as of 01/08/2023.    Review of Systems  Review of Systems  Constitutional: Negative.   HENT: Negative.    Cardiovascular: Negative.   Gastrointestinal: Negative.   Skin:        Erythema to bilateral feet  Allergic/Immunologic: Negative.   Neurological: Negative.   Psychiatric/Behavioral: Negative.       Objective:   BP (!) 164/97   Pulse 84   Wt 164 lb 12.8 oz (74.8 kg)   SpO2 100%   BMI 27.42 kg/m   Wt Readings from Last 5 Encounters:  01/08/23 164 lb 12.8 oz (74.8 kg)  12/11/22 160 lb (72.6 kg)  10/20/22 158 lb (71.7 kg)  07/09/22 158 lb 3.2 oz (71.8 kg)  04/22/22 155 lb (70.3 kg)     Physical Exam Vitals and nursing note reviewed.  Constitutional:      General: She is not in acute distress.    Appearance: She is well-developed.  Cardiovascular:     Rate and Rhythm: Normal rate and regular rhythm.  Pulmonary:     Effort: Pulmonary effort is normal.     Breath sounds: Normal breath sounds.  Skin:    Findings: Erythema (and scaling to bilateral feet) present.  Neurological:     Mental Status: She is alert  and oriented to person, place, and time.       Assessment & Plan:   Type 2 diabetes mellitus with chronic painful diabetic neuropathy (HCC) -     POCT glycosylated hemoglobin (Hb A1C) -     CBC -     Comprehensive metabolic panel -     metFORMIN HCl ER; Take 1 tablet (500 mg total) by mouth daily with breakfast.  Dispense: 90 tablet; Refill: 0  Primary hypertension -     AMB Referral VBCI Care Management -     CBC -     Comprehensive metabolic panel -     amLODIPine Besylate; Take 1 tablet (5 mg total) by mouth daily.  Dispense: 30 tablet; Refill: 0  Dermatitis of foot -     Silver sulfADIAZINE; Apply 1 Application topically  daily.  Dispense: 50 g; Refill: 0 -     Ambulatory referral to Podiatry  Other orders -     Rosuvastatin Calcium; Take 1 tablet (20 mg total) by mouth daily.  Dispense: 30 tablet; Refill: 11 -     Omeprazole; Take 1 capsule (20 mg total) by mouth daily.  Dispense: 90 capsule; Refill: 0     Return in about 6 months (around 07/09/2023).   Ivonne Andrew, NP 01/08/2023

## 2023-01-08 NOTE — Patient Instructions (Addendum)
1. Type 2 diabetes mellitus with chronic painful diabetic neuropathy (HCC)  - POCT glycosylated hemoglobin (Hb A1C) - CBC - Comprehensive metabolic panel  2. Primary hypertension  - AMB Referral VBCI Care Management - CBC - Comprehensive metabolic panel - amLODipine (NORVASC) 5 MG tablet; Take 1 tablet (5 mg total) by mouth daily.  Dispense: 30 tablet; Refill: 0   3. Type 2 diabetes mellitus with diabetic neuropathy, unspecified (HCC)  - metFORMIN (GLUCOPHAGE-XR) 500 MG 24 hr tablet; Take 1 tablet (500 mg total) by mouth daily with breakfast.  Dispense: 90 tablet; Refill: 0  4. Dermatitis of foot  - silver sulfADIAZINE (SILVADENE) 1 % cream; Apply 1 Application topically daily.  Dispense: 50 g; Refill: 0 - Ambulatory referral to Podiatry  Follow up:  Follow up in 6 months

## 2023-01-09 LAB — COMPREHENSIVE METABOLIC PANEL
ALT: 25 [IU]/L (ref 0–32)
AST: 58 [IU]/L — ABNORMAL HIGH (ref 0–40)
Albumin: 4.5 g/dL (ref 3.8–4.9)
Alkaline Phosphatase: 113 [IU]/L (ref 44–121)
BUN/Creatinine Ratio: 10 (ref 9–23)
BUN: 7 mg/dL (ref 6–24)
Bilirubin Total: 0.5 mg/dL (ref 0.0–1.2)
CO2: 22 mmol/L (ref 20–29)
Calcium: 9.2 mg/dL (ref 8.7–10.2)
Chloride: 107 mmol/L — ABNORMAL HIGH (ref 96–106)
Creatinine, Ser: 0.67 mg/dL (ref 0.57–1.00)
Globulin, Total: 2.7 g/dL (ref 1.5–4.5)
Glucose: 98 mg/dL (ref 70–99)
Potassium: 4 mmol/L (ref 3.5–5.2)
Sodium: 146 mmol/L — ABNORMAL HIGH (ref 134–144)
Total Protein: 7.2 g/dL (ref 6.0–8.5)
eGFR: 104 mL/min/{1.73_m2} (ref >59)

## 2023-01-09 LAB — CBC
Hematocrit: 40.7 % (ref 34.0–46.6)
Hemoglobin: 13.7 g/dL (ref 11.1–15.9)
MCH: 33.3 pg — ABNORMAL HIGH (ref 26.6–33.0)
MCHC: 33.7 g/dL (ref 31.5–35.7)
MCV: 99 fL — ABNORMAL HIGH (ref 79–97)
Platelets: 169 10*3/uL (ref 150–450)
RBC: 4.11 x10E6/uL (ref 3.77–5.28)
RDW: 15.3 % (ref 11.7–15.4)
WBC: 9.5 10*3/uL (ref 3.4–10.8)

## 2023-01-12 ENCOUNTER — Telehealth: Payer: Self-pay | Admitting: Clinical

## 2023-01-12 DIAGNOSIS — F251 Schizoaffective disorder, depressive type: Secondary | ICD-10-CM | POA: Diagnosis not present

## 2023-01-12 DIAGNOSIS — F411 Generalized anxiety disorder: Secondary | ICD-10-CM | POA: Diagnosis not present

## 2023-01-12 NOTE — Telephone Encounter (Signed)
Integrated Behavioral Health Progress Note  01/12/2023 Name: Sharon Hunt MRN: [id number] DOB: April 15, 1970 Sharon Hunt is a 52 y.o. year old female who sees Ivonne Andrew, NP for primary care. LCSW was initially consulted to assist the patient with [employer]  and Financial Difficulties related to lack of income and lack of health coverage.   Interpreter: No.   Interpreter Name & Language: none  Assessment: Patient experiencing financial difficulties related to lack of health coverage and income. She is also experiencing mental health concerns.   Ongoing Intervention: Patient called and indicated that a man from our office had called her and she spoke to him, but she was not sure what the call was about and was surprised to receive a call from a man, as we mostly have female employees. She expressed concern that it was a scam. Advised patient that we do have a female CMA working with [place] today. Clarified with CMA, Curley Spice, that he had in fact called patient and given her lab results. Called patient back and explained this to her. Advised that CMA indicted her labs were not concerning.  [name], LCSW [facility] [phone]

## 2023-01-20 ENCOUNTER — Telehealth: Payer: Self-pay | Admitting: Clinical

## 2023-01-20 NOTE — Telephone Encounter (Signed)
Integrated Behavioral Health Progress Note  01/20/2023 Name: Sharon Hunt MRN: [id number] DOB: 30-Jan-1971 KRYSTIE LEITER is a 52 y.o. year old female who sees Ivonne Andrew, NP for primary care. LCSW was initially consulted to assist the patient with [employer]  and Financial Difficulties related to lack of income and lack of health coverage.   Interpreter: No.   Interpreter Name & Language: none  Assessment: Patient experiencing financial difficulties related to lack of health coverage and income. She is also experiencing mental health concerns.   Ongoing Intervention: Called patient to check in. Patient asked about podiatry and ophthalmology referrals. Following up with referral coordinator on these and will plan to update patient.  Patient continues to stay in motels, is now at the [facility] [address] [facility]. She did hear from Social Security that her disability claim has had a favorable outcome. She hasn't been able to receive mail about it though, due to being evicted from her home. CSW advised patient that she can use the [facility]) as a mailing address.  Advised patient that CSW is leaving the practice at the end of November. Patient is upset about the change and nervous about being able to rely on a new Child psychotherapist for support. Provided brief supportive counseling around the transition.   [name], LCSW [facility] [phone]

## 2023-01-22 ENCOUNTER — Telehealth: Payer: Self-pay

## 2023-01-22 NOTE — Progress Notes (Signed)
   Care Guide Note  01/22/2023 Name: SMT. LODER MRN: [id number] DOB: Apr 08, 1970  Referred by: Ivonne Andrew, NP Reason for referral : Care Coordination (Outreach to schedule with pharm d )   Sharon Hunt is a 52 y.o. year old female who is a primary care patient of Ivonne Andrew, NP. Coretha DAMITA EPPARD was referred to the pharmacist for assistance related to HTN.    Successful contact was made with the patient to discuss pharmacy services including being ready for the pharmacist to call at least 5 minutes before the scheduled appointment time, to have medication bottles and any blood sugar or blood pressure readings ready for review. The patient agreed to meet with the pharmacist via with the pharmacist via telephone visit on (date/time).  02/17/2023  Penne Lash, RMA Care Guide [facility]  [place], [place] [address] Direct Dial: [phone] [email or web address]

## 2023-01-27 ENCOUNTER — Telehealth: Payer: Self-pay | Admitting: Clinical

## 2023-01-27 NOTE — Telephone Encounter (Signed)
Integrated Behavioral Health Progress Note  01/27/2023 Name: URMI MCFATTER MRN: [id number] DOB: 1970-08-20 SHAWNDEE TEJERO is a 52 y.o. year old female who sees Ivonne Andrew, NP for primary care. LCSW was initially consulted to assist the patient with [employer]  and Financial Difficulties related to lack of income and lack of health coverage.   Interpreter: No.   Interpreter Name & Language: none  Assessment: Patient experiencing financial difficulties related to lack of health coverage and income. She is also experiencing mental health concerns.   Ongoing Intervention: Patient was referred to podiatry. CSW called [facility] and scheduled patient's appointment for her. Called patient today and advised her of the appointment. Patient is still staying in a motel and she is not sure where she will be next week at the time of appointment. Will plan to check in with patient before appointment to see if she needs assistance with transportation.  [name], LCSW [facility] [phone]

## 2023-01-28 ENCOUNTER — Telehealth: Payer: Self-pay | Admitting: Clinical

## 2023-01-28 NOTE — Telephone Encounter (Signed)
Integrated Behavioral Health Progress Note  01/28/2023 Name: Sharon Hunt MRN: [id number] DOB: 25-Apr-1970 Sharon Hunt is a 52 y.o. year old female who sees Ivonne Andrew, NP for primary care. LCSW was initially consulted to assist the patient with [employer]  and Financial Difficulties related to lack of income and lack of health coverage.   Interpreter: No.   Interpreter Name & Language: none  Assessment: Patient experiencing financial difficulties related to lack of health coverage and income. She is also experiencing mental health concerns.   Ongoing Intervention: Patient called CSW and reported that her social security disability was approved. She went to the [employer] office today and they advised that they had approved her and sent her a letter. She hadn't received it though since it went to her old address and she was evicted from that home.   She is planning to update her income info with [employer] and check her status on the wait list there. CSW also sent patient some information on income based housing to apply for; advised that they also have wait lists.   [name], LCSW Patient Care Center [facility] [phone]

## 2023-02-03 ENCOUNTER — Telehealth: Payer: Self-pay | Admitting: Clinical

## 2023-02-03 NOTE — Telephone Encounter (Signed)
Integrated Behavioral Health Progress Note  02/03/2023 Name: Sharon Hunt MRN: [id number] DOB: 03-23-1970 Sharon Hunt is a 52 y.o. year old female who sees Ivonne Andrew, NP for primary care. LCSW was initially consulted to assist the patient with [employer]  and Financial Difficulties related to lack of income and lack of health coverage.   Interpreter: No.   Interpreter Name & Language: none  Assessment: Patient experiencing financial difficulties related to lack of health coverage and income. She is also experiencing mental health concerns.   Ongoing Intervention: Called patient to check in and she is staying at a different motel now. She was able to get some assistance with a motel voucher from [facility]. She has an appointment with podiatry tomorrow and does not have transportation. CSW arranged taxi transportation for patient to this appointment.  [name], LCSW Patient Care Center [facility] [phone]

## 2023-02-04 ENCOUNTER — Other Ambulatory Visit (HOSPITAL_COMMUNITY): Payer: Self-pay

## 2023-02-04 ENCOUNTER — Ambulatory Visit (INDEPENDENT_AMBULATORY_CARE_PROVIDER_SITE_OTHER): Payer: MEDICAID | Admitting: Podiatry

## 2023-02-04 ENCOUNTER — Encounter: Payer: Self-pay | Admitting: Podiatry

## 2023-02-04 DIAGNOSIS — M79674 Pain in right toe(s): Secondary | ICD-10-CM | POA: Diagnosis not present

## 2023-02-04 DIAGNOSIS — B351 Tinea unguium: Secondary | ICD-10-CM

## 2023-02-04 DIAGNOSIS — B353 Tinea pedis: Secondary | ICD-10-CM

## 2023-02-04 DIAGNOSIS — E1140 Type 2 diabetes mellitus with diabetic neuropathy, unspecified: Secondary | ICD-10-CM

## 2023-02-04 DIAGNOSIS — M79675 Pain in left toe(s): Secondary | ICD-10-CM

## 2023-02-04 MED ORDER — CLOTRIMAZOLE-BETAMETHASONE 1-0.05 % EX CREA
1.0000 | TOPICAL_CREAM | Freq: Every day | CUTANEOUS | 0 refills | Status: AC
Start: 2023-02-04 — End: ?
  Filled 2023-02-04 – 2023-07-28 (×2): qty 30, 30d supply, fill #0

## 2023-02-04 NOTE — Progress Notes (Signed)
  Subjective:  Patient ID: Sharon Hunt, female    DOB: Jul 17, 1970,   MRN: [id number]  No chief complaint on file.   52 y.o. female presents for concern of thickened elongated and painful nails that are difficult to trim. Requesting to have them trimmed today. Relates burning and tingling in their feet. Patient is diabetic and last A1c was  Lab Results  Component Value Date   HGBA1C 5.3 01/08/2023   . Relates patches on feet have been improving with ketoconazole.   PCP:  Ivonne Andrew, NP     Past Medical History:  Diagnosis Date   GERD (gastroesophageal reflux disease)    H/O hiatal hernia    Hyperlipidemia    Hypertension    Type 2 diabetes mellitus with chronic painful diabetic neuropathy (HCC) 12/20/2021    Objective:  Physical Exam: Vascular: DP/PT pulses 2/4 bilateral. CFT <3 seconds. Absent hair growth on digits. Edema noted to bilateral lower extremities. Xerosis noted bilaterally.  Skin. No lacerations or abrasions bilateral feet. Nails 1-5 bilateral  are thickened discolored and elongated with subungual debris. Scaling and erythemetous patches noted on plantar and medial feet bilateral.  Musculoskeletal: MMT 5/5 bilateral lower extremities in DF, PF, Inversion and Eversion. Deceased ROM in DF of ankle joint.  Neurological: Sensation intact to light touch. Protective sensation diminished bilateral.    Assessment:   1. Pain due to onychomycosis of toenails of both feet   2. Type 2 diabetes mellitus with chronic painful diabetic neuropathy (HCC)   3. Tinea pedis of both feet       Plan:  Patient was evaluated and treated and all questions answered. -Discussed and educated patient on diabetic foot care, especially with  regards to the vascular, neurological and musculoskeletal systems.  -Stressed the importance of good glycemic control and the detriment of not  controlling glucose levels in relation to the foot. -Discussed supportive shoes at all times and  checking feet regularly.  -Mechanically debrided all nails 1-5 bilateral using sterile nail nipper and filed with dremel without incident -Ketoconazole has failed will try lotrisone.   -Answered all patient questions -Patient to return  in 3 months for at risk foot care -Patient advised to call the office if any problems or questions arise in the meantime.   Louann Sjogren, DPM

## 2023-02-06 ENCOUNTER — Telehealth: Payer: Self-pay | Admitting: Clinical

## 2023-02-06 NOTE — Telephone Encounter (Signed)
Integrated Behavioral Health Progress Note  02/06/2023 Name: Sharon Hunt MRN: [id number] DOB: 07-26-1970 Sharon Hunt is a 52 y.o. year old female who sees Ivonne Andrew, NP for primary care. LCSW was initially consulted to assist the patient with [employer]  and Financial Difficulties related to lack of income and lack of health coverage.   Interpreter: No.   Interpreter Name & Language: none  Assessment: Patient experiencing financial difficulties related to lack of health coverage and income. She is also experiencing mental health concerns.   Ongoing Intervention: Called patient to check in and make sure she got to her podiatry appointment ok earlier this week. She answered but then reported she was tired and would call back later. CSW awaiting return call.   [name], LCSW [facility] [phone]

## 2023-02-10 ENCOUNTER — Telehealth: Payer: Self-pay | Admitting: Clinical

## 2023-02-10 NOTE — Telephone Encounter (Signed)
Integrated Behavioral Health Progress Note  02/10/2023 Name: Sharon Hunt MRN: [id number] DOB: 01-27-1971 Sharon Hunt is a 52 y.o. year old female who sees Ivonne Andrew, NP for primary care. LCSW was initially consulted to assist the patient with [employer]  and Financial Difficulties related to lack of income and lack of health coverage.   Interpreter: No.   Interpreter Name & Language: none  Assessment: Patient experiencing financial difficulties related to lack of health coverage and income. She is also experiencing mental health concerns.   Ongoing Intervention: Called patient today to check in, as CSW leaving the practice. Patient continues to stay in a motel. She was only recently approved for disability and hasn't started receiving payments yet. Advised patient to check back in with [facility] to see if they have additional housing resource options now that she will have income.  Patient has Thanksgiving plans with her family. Her family also arrived to see her while CSW was on the phone. Patient plans to continue seeing her PCP here at [facility]). She has become more comfortable with her psychiatrist at [facility] and plans to continue seeing her as well.   [name], LCSW [facility] [phone]

## 2023-02-16 ENCOUNTER — Telehealth: Payer: Self-pay | Admitting: *Deleted

## 2023-02-16 ENCOUNTER — Other Ambulatory Visit (HOSPITAL_COMMUNITY): Payer: Self-pay

## 2023-02-16 ENCOUNTER — Ambulatory Visit: Payer: Self-pay | Admitting: Licensed Clinical Social Worker

## 2023-02-16 ENCOUNTER — Telehealth: Payer: Self-pay | Admitting: Nurse Practitioner

## 2023-02-16 ENCOUNTER — Other Ambulatory Visit: Payer: Self-pay | Admitting: Nurse Practitioner

## 2023-02-16 DIAGNOSIS — Z5902 Unsheltered homelessness: Secondary | ICD-10-CM

## 2023-02-16 NOTE — Telephone Encounter (Signed)
Error

## 2023-02-16 NOTE — Patient Outreach (Signed)
  Care Coordination   Initial Visit Note   02/16/2023 Name: Sharon Hunt MRN: [id number] DOB: [birth year]  Sharon Hunt is a 52 y.o. year old female who sees Ivonne Andrew, NP for primary care. I spoke with  Sharon Hunt by phone today.  What matters to the patients health and wellness today? Patient Stated she is anxious about managing housing needs and managing medical needs      Goals Addressed             This Visit's Progress    Patient Stated she is anxious about managing housing needs and managing medical needs       Interventions Spoke with client via phone today about client needs Client went to [facility] today to inquire about housing options or assistance. She then went to [employer] (nearby) and completed paperwork with representative at [facility]. Representative of that agency to send referral for client back to [facility] (priority) to seek housing support for [employer] LCSW gave Sharon Hunt the name and phone number of Koren Shiver [phone] ) and encouraged her to call that number to discuss support from care members through [facility]. Client to call [facility] number tomorrow to discuss her needs with representative from [facility] Client is now looking for housing. She has a vehicle but it is hard to buy gas. She has little family support Client said she is taking her medications as prescribed. She said she sees psychiatrist at [facility] for mental health .  LCSW discussed [employer] representative as resource for housing. LCSW suggested [employer] in [place] as resources for housing. Client said it is hard for her to be around groups of people . Would like to live on her on or have place where she was only person residing at that residence Discussed [facility] Support as possible resource for client Discussed client help with NP Angus Seller Gave client LCSW name and phone number and encouraged her to call LCSW  at [phone] as needed to  discuss SW needs of client Client was appreciative of call from LCSW today          SDOH assessments and interventions completed:  Yes  SDOH Interventions Today    Flowsheet Row Most Recent Value  SDOH Interventions   Depression Interventions/Treatment  Medication  Stress Interventions Other (Comment)  [has stress in managing medical needs,  has stress in managing housing needs]        Care Coordination Interventions:  Yes, provided    Interventions Today    Flowsheet Row Most Recent Value  Chronic Disease   Chronic disease during today's visit Other  [spoke with client about client needs]  General Interventions   General Interventions Discussed/Reviewed General Interventions Discussed, Community Resources  Education Interventions   Education Provided Provided Education  Provided Verbal Education On [employer]  Mental Health Interventions   Mental Health Discussed/Reviewed Coping Strategies  [client is anxious about housing needs,  she is anxious about medical needs. she takes medications as prescribed. She sess psychiatrist at [facility]]  Pharmacy Interventions   Pharmacy Dicussed/Reviewed Pharmacy Topics Discussed       Follow up plan: Client has LCSW name and phone number and will call LCSW as needed to discuss SW needs of client   Encounter Outcome:  Patient Visit Completed   Kelton Pillar.Zaylei Mullane MSW, LCSW Licensed Visual merchandiser [facility] Care Management [phone]

## 2023-02-16 NOTE — Progress Notes (Signed)
  Care Coordination   Note   02/16/2023 Name: [name] MRN: [id number] DOB: 1970-12-06  Sharon Hunt is a 52 y.o. year old female who sees Ivonne Andrew, NP for primary care. I reached out to Luvenia Redden by phone today to offer care coordination services.  [name] was given information about Care Coordination services today including:   The Care Coordination services include support from the care team which includes your Nurse Coordinator, Clinical Social Worker, or Pharmacist.  The Care Coordination team is here to help remove barriers to the health concerns and goals most important to you. Care Coordination services are voluntary, and the patient may decline or stop services at any time by request to their care team member.   Care Coordination Consent Status: Patient agreed to services and verbal consent obtained.   Follow up plan:  Telephone appointment with care coordination team member scheduled for:  02/16/23  Encounter Outcome:  Patient Scheduled  [facility] Coordination Care Guide  Direct Dial: [phone]

## 2023-02-16 NOTE — Patient Instructions (Signed)
Visit Information  Thank you for taking time to visit with me today. Please don't hesitate to contact me if I can be of assistance to you.   Following are the goals we discussed today:   Goals Addressed             This Visit's Progress    Patient Stated she is anxious about managing housing needs and managing medical needs       Interventions Spoke with client via phone today about client needs Client went to [facility] today to inquire about housing options or assistance. She then went to [employer] (nearby) and completed paperwork with representative at [facility]. Representative of that agency to send referral for client back to [facility] (priority) to seek housing support for [employer] LCSW gave Shenika Marois the name and phone number of Koren Shiver [phone] ) and encouraged her to call that number to discuss support from care members through [facility]. Client to call [facility] number tomorrow to discuss her needs with representative from [facility] Client is now looking for housing. She has a vehicle but it is hard to buy gas. She has little family support Client said she is taking her medications as prescribed. She said she sees psychiatrist at [facility] for mental health .  LCSW discussed [employer] representative as resource for housing. LCSW suggested [employer] in [place] as resources for housing. Client said it is hard for her to be around groups of people . Would like to live on her on or have place where she was only person residing at that residence Discussed [facility] Support as possible resource for client Discussed client help with NP Angus Seller Gave client LCSW name and phone number and encouraged her to call LCSW at [phone] as needed to  discuss SW needs of client Client was appreciative of call from LCSW today         Client has LCSW name and phone number and will call LCSW as needed for SW support  Please  call the care guide team at [phone] if you need to cancel or reschedule your appointment.   If you are experiencing a Mental Health or Behavioral Health Crisis or need someone to talk to, please go to [facility] [address] [place] [phone])   The patient verbalized understanding of instructions, educational materials, and care plan provided today and DECLINED offer to receive copy of patient instructions, educational materials, and care plan.   The patient has been provided with contact information for the care management team and has been advised to call with any health related questions or concerns.   Kelton Pillar.Zalia Hautala MSW, LCSW Licensed Visual merchandiser [facility] [phone]

## 2023-02-17 ENCOUNTER — Other Ambulatory Visit (INDEPENDENT_AMBULATORY_CARE_PROVIDER_SITE_OTHER): Payer: MEDICAID | Admitting: Pharmacist

## 2023-02-17 ENCOUNTER — Other Ambulatory Visit: Payer: Self-pay

## 2023-02-17 ENCOUNTER — Other Ambulatory Visit (HOSPITAL_COMMUNITY): Payer: Self-pay

## 2023-02-17 DIAGNOSIS — I10 Essential (primary) hypertension: Secondary | ICD-10-CM

## 2023-02-17 DIAGNOSIS — L309 Dermatitis, unspecified: Secondary | ICD-10-CM

## 2023-02-17 DIAGNOSIS — F32A Depression, unspecified: Secondary | ICD-10-CM

## 2023-02-17 DIAGNOSIS — G629 Polyneuropathy, unspecified: Secondary | ICD-10-CM

## 2023-02-17 DIAGNOSIS — E1140 Type 2 diabetes mellitus with diabetic neuropathy, unspecified: Secondary | ICD-10-CM

## 2023-02-17 MED ORDER — NICOTINE 21 MG/24HR TD PT24
21.0000 mg | MEDICATED_PATCH | Freq: Every day | TRANSDERMAL | 1 refills | Status: DC
  Filled 2023-02-17 – 2023-02-27 (×3): qty 28, 28d supply, fill #0

## 2023-02-17 MED ORDER — ROSUVASTATIN CALCIUM 20 MG PO TABS
20.0000 mg | ORAL_TABLET | Freq: Every day | ORAL | 1 refills | Status: DC
  Filled 2023-02-17 – 2023-07-28 (×4): qty 90, 90d supply, fill #0

## 2023-02-17 MED ORDER — TRIAMCINOLONE ACETONIDE 0.5 % EX OINT
1.0000 | TOPICAL_OINTMENT | Freq: Two times a day (BID) | CUTANEOUS | 1 refills | Status: DC
  Filled 2023-02-17 – 2023-02-27 (×2): qty 30, 15d supply, fill #0
  Filled 2023-07-28: qty 30, 15d supply, fill #1

## 2023-02-17 MED ORDER — AMLODIPINE BESYLATE 5 MG PO TABS
5.0000 mg | ORAL_TABLET | Freq: Every day | ORAL | 1 refills | Status: DC
Start: 2023-02-17 — End: 2023-12-16
  Filled 2023-02-17 – 2023-02-27 (×2): qty 90, 90d supply, fill #0
  Filled 2023-06-17 – 2023-07-28 (×2): qty 90, 90d supply, fill #1

## 2023-02-17 MED ORDER — LISINOPRIL 40 MG PO TABS
40.0000 mg | ORAL_TABLET | Freq: Every day | ORAL | 1 refills | Status: DC
  Filled 2023-02-17 – 2023-02-27 (×2): qty 90, 90d supply, fill #0
  Filled 2023-06-17 – 2023-07-31 (×2): qty 90, 90d supply, fill #1

## 2023-02-17 MED ORDER — NICOTINE 14 MG/24HR TD PT24
14.0000 mg | MEDICATED_PATCH | Freq: Every day | TRANSDERMAL | 2 refills | Status: DC
  Filled 2023-02-17 (×2): qty 28, 28d supply, fill #0
  Filled 2023-06-17: qty 28, 28d supply, fill #1

## 2023-02-17 MED ORDER — OMEPRAZOLE 20 MG PO CPDR
20.0000 mg | DELAYED_RELEASE_CAPSULE | Freq: Every day | ORAL | 1 refills | Status: DC
  Filled 2023-02-17 – 2023-06-17 (×2): qty 90, 90d supply, fill #0

## 2023-02-17 MED ORDER — PAROXETINE HCL 10 MG PO TABS
10.0000 mg | ORAL_TABLET | Freq: Every day | ORAL | 1 refills | Status: DC
Start: 2023-02-17 — End: 2023-04-14
  Filled 2023-02-17 – 2023-02-27 (×2): qty 90, 90d supply, fill #0

## 2023-02-17 NOTE — Progress Notes (Signed)
02/17/2023 Name: Sharon Hunt MRN: [id number] DOB: [birth year]  Chief Complaint  Patient presents with   Medication Management   Hypertension    Sharon Hunt is a 52 y.o. year old female who presented for a telephone visit.   They were referred to the pharmacist by their PCP for assistance in managing hypertension.    Subjective:  Care Team: Primary Care Provider: Ivonne Andrew, NP ; Next Scheduled Visit: 07/09/23   Medication Access/Adherence  Current Pharmacy:  [facility] [address] [place] [address] Phone: [phone] Fax: [phone]   Patient reports affordability concerns with their medications: No  Patient reports access/transportation concerns to their pharmacy: No  Patient reports adherence concerns with their medications:  No    Housing insecurity. Did stay at a center in [facility] overnight. Spoke with Sharon Hunt yesterday and given information to call [employer]. Currently in her car. Has run out of several medications. Will start receiving disability payments later this month.  Diabetes:  Current medications: metformin XR 500 mg daily  Hypertension:  Current medications: amlodipine 5 mg daily, lisinopril 40 mg daily   Patient has a validated, automated, upper arm home BP cuff but does not have batteries.   Hyperlipidemia/ASCVD Risk Reduction  Current lipid lowering medications: rosuvastatin 20 mg daily   Depression/Anxiety:  Current medications: paroxetine 10 mg daily   Tobacco Abuse:  Tobacco Use History: Number of cigarettes per day: 1/2 pack generally,   Very interested in nicotine patches.  Objective:  Lab Results  Component Value Date   HGBA1C 5.3 01/08/2023    Lab Results  Component Value Date   CREATININE 0.67 01/08/2023   BUN 7 01/08/2023   NA 146 (H) 01/08/2023   K 4.0 01/08/2023   CL 107 (H) 01/08/2023   CO2 22 01/08/2023    Lab Results  Component  Value Date   CHOL 318 (H) 07/09/2022   HDL 123 07/09/2022   LDLCALC 179 (H) 07/09/2022   TRIG 101 07/09/2022   CHOLHDL 2.6 07/09/2022    Medications Reviewed Today     Reviewed by Sharon Hunt, RPH-CPP (Pharmacist) on 02/17/23 at 1121  Med List Status: <None>   Medication Order Taking? Sig Documenting Provider Last Dose Status Informant  aluminum-magnesium hydroxide-simethicone (MAALOX) 200-200-20 MG/5ML SUSP [id number]  Take 30 mLs by mouth in the morning and at bedtime. [provider]  Active Self  amLODipine (NORVASC) 5 MG tablet [id number]  Take 1 tablet (5 mg total) by mouth daily. Sharon Andrew, NP  Active   augmented betamethasone dipropionate (DIPROLENE-AF) 0.05 % ointment [id number]  APPLY TOPICALLY TWICE DAILY Sharon Andrew, NP  Active   cariprazine (VRAYLAR) 1.5 MG capsule [id number]  Take 1 capsule (1.5 mg total) by mouth daily.  Patient not taking: Reported on 12/11/2022     Active   cariprazine (VRAYLAR) 3 MG capsule [id number]  Take 1 capsule (3 mg total) by mouth daily.  Patient not taking: Reported on 12/11/2022     Active   cariprazine (VRAYLAR) 3 MG capsule [id number]  Take one capsule (3 mg total) by mouth every day.   Active   clotrimazole-betamethasone (LOTRISONE) cream [id number]  Apply 1 Application topically daily. Louann Hunt, DPM  Active     Discontinued 02/17/23 1108 (Completed Course)   folic acid (FOLVITE) 1 MG tablet [id number]  Take 1 tablet (1 mg total) by mouth daily. Sharon Bong, MD  Active   gabapentin (  NEURONTIN) 300 MG capsule [id number]  Take 1 capsule (300 mg total) by mouth 3 (three) times daily. Sharon Beers, FNP  Active   hydrocerin (EUCERIN) CREA [id number]  Apply 1 application. topically 3 (three) times daily.  Patient not taking: Reported on 01/08/2023   Sharon Bong, MD  Active   hydrOXYzine (ATARAX) 25 MG tablet [id number]  Take 1 tablet (25 mg total) by mouth 3 (three) times daily as needed for anxiety.  Sharon Andrew, NP  Active   ketoconazole (NIZORAL) 2 % cream [id number]  Apply 1 Application topically daily.  Patient not taking: Reported on 01/08/2023   Louann Hunt, DPM  Active   lisinopril (ZESTRIL) 40 MG tablet [id number]  Take 1 tablet (40 mg total) by mouth daily. Sharon Andrew, NP  Active   metFORMIN (GLUCOPHAGE-XR) 500 MG 24 hr tablet [id number]  Take 1 tablet (500 mg total) by mouth daily with breakfast. Sharon Andrew, NP  Active   Multiple Vitamin (MULTIVITAMIN WITH MINERALS) TABS tablet [id number]  Take 1 tablet by mouth daily. Sharon Andrew, NP  Active   nicotine (NICODERM CQ - DOSED IN MG/24 HOURS) 14 mg/24hr patch [id number] Yes Place 1 patch (14 mg total) onto the skin daily. Sharon Andrew, NP  Active   nicotine (NICODERM CQ - DOSED IN MG/24 HOURS) 21 mg/24hr patch [id number]  Place 1 patch (21 mg total) onto the skin daily. Sharon Andrew, NP  Active   omeprazole (PRILOSEC) 20 MG capsule [id number]  Take 1 capsule (20 mg total) by mouth daily. Sharon Andrew, NP  Active      Discontinued 02/17/23 1114   PARoxetine (PAXIL) 10 MG tablet [id number]  Take 1 tablet (10 mg total) by mouth daily. Sharon Andrew, NP  Active   rosuvastatin (CRESTOR) 20 MG tablet [id number]  Take 1 tablet (20 mg total) by mouth daily. Sharon Andrew, NP  Active   silver sulfADIAZINE (SILVADENE) 1 % cream [id number] Yes Apply 1 Application topically daily. Sharon Andrew, NP Taking Active   thiamine (VITAMIN B1) 100 MG tablet [id number]  Take 1 tablet (100 mg total) by mouth daily.  Patient not taking: Reported on 10/20/2022   Sharon Andrew, NP  Active   triamcinolone ointment (KENALOG) 0.5 % [id number]  Apply 1 Application topically 2 (two) times daily. Sharon Andrew, NP  Active             SDOH Interventions Today    Flowsheet Row Most Recent Value  SDOH Interventions   Housing Interventions --  [Medicaid team]  Financial Strain Interventions --  [medicaid team]        Assessment/Plan:   Reiterated to contact [employer] for case management support. She will do so.   Diabetes: - Currently controlled - Recommend to continue current regimen at this time  Hypertension: - Currently uncontrolled, impacted by medication access, stress, housing insecurity.  - Reviewed long term cardiovascular and renal outcomes of uncontrolled blood pressure - Notes she is out of batteries. Will provide for her to pick up at the office as able.  - Recommend to continue currently prescribed medications. Will collaborate with PCP for refills.    Hyperlipidemia/ASCVD Risk Reduction: - Currently uncontrolled due to medication access - Recommend to continue current regimen. Will collaborate with PCP for refills.    Depression/Anxiety: - Currently moderately well controlled, impacted by social stressors and medication access - Recommend to continue current regimen. Will collaborate with PCP for refills.  Tobacco Abuse - Currently uncontrolled - Provided motivational interviewing to assess tobacco use and strategies for reduction - Provided information on 1 800 QUIT NOW support program - - start nicotine patch 21 mg mg daily. Counseled on proper placement and potential side effects, including mild itching/redness at the location site, headache, trouble sleeping and/or vivid dreams. Advised to remove patch at night if development of trouble sleeping. Will order 14 mg as well for patient to step down to. Will collaborate with PCP for orders  Follow Up Plan: phone call in 6 weeks  Catie TClearance Coots, PharmD, BCACP, CPP Clinical Pharmacist [facility] [phone]

## 2023-02-19 ENCOUNTER — Other Ambulatory Visit (HOSPITAL_COMMUNITY): Payer: Self-pay

## 2023-02-27 ENCOUNTER — Other Ambulatory Visit (HOSPITAL_COMMUNITY): Payer: Self-pay

## 2023-03-10 DIAGNOSIS — H5213 Myopia, bilateral: Secondary | ICD-10-CM | POA: Diagnosis not present

## 2023-03-24 ENCOUNTER — Other Ambulatory Visit: Payer: Self-pay

## 2023-03-24 ENCOUNTER — Other Ambulatory Visit (HOSPITAL_COMMUNITY): Payer: Self-pay

## 2023-03-24 DIAGNOSIS — F251 Schizoaffective disorder, depressive type: Secondary | ICD-10-CM | POA: Diagnosis not present

## 2023-03-24 DIAGNOSIS — F411 Generalized anxiety disorder: Secondary | ICD-10-CM | POA: Diagnosis not present

## 2023-03-24 MED ORDER — HYDROXYZINE HCL 25 MG PO TABS
25.0000 mg | ORAL_TABLET | Freq: Three times a day (TID) | ORAL | 2 refills | Status: AC | PRN
  Filled 2023-03-24: qty 90, 30d supply, fill #0

## 2023-03-24 MED ORDER — VRAYLAR 3 MG PO CAPS
3.0000 mg | ORAL_CAPSULE | Freq: Every day | ORAL | 3 refills | Status: AC
  Filled 2023-03-24: qty 30, 30d supply, fill #0

## 2023-03-24 MED ORDER — PAROXETINE HCL 10 MG PO TABS
10.0000 mg | ORAL_TABLET | Freq: Every day | ORAL | 3 refills | Status: DC
  Filled 2023-06-17: qty 30, 30d supply, fill #0

## 2023-04-04 DIAGNOSIS — Z20822 Contact with and (suspected) exposure to covid-19: Secondary | ICD-10-CM | POA: Diagnosis not present

## 2023-04-04 DIAGNOSIS — B9789 Other viral agents as the cause of diseases classified elsewhere: Secondary | ICD-10-CM | POA: Diagnosis not present

## 2023-04-04 DIAGNOSIS — J069 Acute upper respiratory infection, unspecified: Secondary | ICD-10-CM | POA: Diagnosis not present

## 2023-04-04 DIAGNOSIS — Z556 Problems related to health literacy: Secondary | ICD-10-CM | POA: Diagnosis not present

## 2023-04-04 DIAGNOSIS — R059 Cough, unspecified: Secondary | ICD-10-CM | POA: Diagnosis not present

## 2023-04-06 ENCOUNTER — Other Ambulatory Visit (HOSPITAL_COMMUNITY): Payer: Self-pay

## 2023-04-07 ENCOUNTER — Telehealth: Payer: Self-pay

## 2023-04-07 NOTE — Transitions of Care (Post Inpatient/ED Visit) (Signed)
   04/07/2023  Name: Sharon Hunt MRN: [id number] DOB: Dec 14, 1970  Today's TOC FU Call Status:   Patient's Name and Date of Birth confirmed.  Transition Care Management Follow-up Telephone Call Date of Discharge: 04/05/23 Discharge Facility: Other [facility]) Name of Other (Non-[facility]) Discharge Facility: [facility] Type of Discharge: Emergency Department Reason for ED Visit: Respiratory How have you been since you were released from the hospital?: Better  Items Reviewed: Medications obtained,verified, and reconciled?: Yes (Medications Reviewed) Any new allergies since your discharge?: No Dietary orders reviewed?: No Do you have support at home?: No  Medications Reviewed Today: Medications Reviewed Today   Medications were not reviewed in this encounter     Home Care and Equipment/Supplies: Were Home Health Services Ordered?: No Any new equipment or medical supplies ordered?: No  Functional Questionnaire: Do you need assistance with bathing/showering or dressing?: No Do you need assistance with meal preparation?: No Do you need assistance with eating?: No Do you have difficulty maintaining continence: No Do you need assistance with getting out of bed/getting out of a chair/moving?: No Do you have difficulty managing or taking your medications?: No  Follow up appointments reviewed: PCP Follow-up appointment confirmed?: Yes Date of PCP follow-up appointment?: 04/14/23 Follow-up Provider: Angus Seller Specialist [facility] Follow-up appointment confirmed?: No Do you need transportation to your follow-up appointment?: No    SIGNATURE Angus Seller, FNP

## 2023-04-14 ENCOUNTER — Encounter: Payer: Self-pay | Admitting: Nurse Practitioner

## 2023-04-14 ENCOUNTER — Ambulatory Visit (INDEPENDENT_AMBULATORY_CARE_PROVIDER_SITE_OTHER): Payer: Medicaid Other | Admitting: Nurse Practitioner

## 2023-04-14 ENCOUNTER — Other Ambulatory Visit: Payer: Self-pay

## 2023-04-14 ENCOUNTER — Other Ambulatory Visit: Payer: Self-pay | Admitting: Pharmacist

## 2023-04-14 VITALS — BP 150/93 | HR 92 | Temp 97.5°F | Wt 165.4 lb

## 2023-04-14 DIAGNOSIS — E1140 Type 2 diabetes mellitus with diabetic neuropathy, unspecified: Secondary | ICD-10-CM

## 2023-04-14 DIAGNOSIS — H9202 Otalgia, left ear: Secondary | ICD-10-CM | POA: Diagnosis not present

## 2023-04-14 DIAGNOSIS — R350 Frequency of micturition: Secondary | ICD-10-CM | POA: Diagnosis not present

## 2023-04-14 DIAGNOSIS — G4700 Insomnia, unspecified: Secondary | ICD-10-CM | POA: Diagnosis not present

## 2023-04-14 LAB — POCT URINALYSIS DIP (CLINITEK)
Bilirubin, UA: NEGATIVE
Glucose, UA: NEGATIVE mg/dL
Ketones, POC UA: NEGATIVE mg/dL
Leukocytes, UA: NEGATIVE
Nitrite, UA: NEGATIVE
Spec Grav, UA: 1.02 (ref 1.010–1.025)
Urobilinogen, UA: 0.2 U/dL
pH, UA: 5.5 (ref 5.0–8.0)

## 2023-04-14 LAB — POCT GLYCOSYLATED HEMOGLOBIN (HGB A1C): Hemoglobin A1C: 5.7 % — AB (ref 4.0–5.6)

## 2023-04-14 MED ORDER — OFLOXACIN 0.3 % OT SOLN
5.0000 [drp] | Freq: Every day | OTIC | 0 refills | Status: DC
  Filled 2023-04-14: qty 5, 20d supply, fill #0

## 2023-04-14 MED ORDER — DOXYCYCLINE HYCLATE 100 MG PO TABS
100.0000 mg | ORAL_TABLET | Freq: Two times a day (BID) | ORAL | 0 refills | Status: AC
Start: 2023-04-14 — End: 2023-04-24
  Filled 2023-04-14: qty 20, 10d supply, fill #0

## 2023-04-14 MED ORDER — TRAZODONE HCL 50 MG PO TABS
25.0000 mg | ORAL_TABLET | Freq: Every evening | ORAL | 3 refills | Status: DC | PRN
  Filled 2023-04-14 – 2023-07-28 (×2): qty 30, 30d supply, fill #0

## 2023-04-14 NOTE — Patient Instructions (Signed)
1. Type 2 diabetes mellitus with chronic painful diabetic neuropathy (HCC) (Primary)  - POCT glycosylated hemoglobin (Hb A1C)  2. Frequency of urination  - POCT URINALYSIS DIP (CLINITEK)  3. Left ear pain  - ofloxacin (FLOXIN) 0.3 % OTIC solution; Place 5 drops into the left ear daily.  Dispense: 5 mL; Refill: 0  4. Insomnia, unspecified type  - traZODone (DESYREL) 50 MG tablet; Take 0.5-1 tablets (25-50 mg total) by mouth at bedtime as needed for sleep.  Dispense: 30 tablet; Refill: 3   Follow up:  Follow up in 6 months

## 2023-04-14 NOTE — Progress Notes (Signed)
Subjective   Patient ID: Sharon Hunt, female    DOB: 1970/05/29, 53 y.o.   MRN: [id number]  Chief Complaint  Patient presents with   Hospitalization Follow-up    Ears blocked up    Referring provider: Ivonne Andrew, NP  SADIYA DURAND is a 53 y.o. female with Past Medical History: No date: GERD (gastroesophageal reflux disease) No date: H/O hiatal hernia No date: Hyperlipidemia No date: Hypertension 12/20/2021: Type 2 diabetes mellitus with chronic painful diabetic  neuropathy (HCC)   HPI  Diabetes    Patient presents today for follow-up visit for hypertension and diabetes.  Her blood pressure was elevated in office today. She was advised to watch blood pressure at home and make sure that she continues to take her medicine.  Patient gets very nervous going into doctors offices. Patient stated that she did not have food so we did get her food from the food pantry.  Diabetes is currently well controlled. A1C is at 5.7 today. Pharmacy is following with her for medication management for hypertension.  Denies f/c/s, n/v/d, hemoptysis, PND, leg swelling Denies chest pain or edema.   Patient was recently seen in the ED on 04/04/2023 she was diagnosed with viral URI and cough.  She continues to have chest congestion and cough.  We will trial doxycycline.  She also complains of left ear pain.  We will trial of ofloxin drops.   Denies f/c/s, n/v/d, hemoptysis, PND, leg swelling Denies chest pain or edema     Allergies  Allergen Reactions   Coconut Flavoring Agent (Non-Screening) Itching   Penicillins      There is no immunization history on file for this patient.  Tobacco History: Social History   Tobacco Use  Smoking Status Every Day   Current packs/day: 1.00   Types: Cigarettes  Smokeless Tobacco Not on file   Ready to quit: Yes Counseling given: Yes   Outpatient Encounter Medications as of 04/14/2023  Medication Sig   aluminum-magnesium  hydroxide-simethicone (MAALOX) 200-200-20 MG/5ML SUSP Take 30 mLs by mouth in the morning and at bedtime.   amLODipine (NORVASC) 5 MG tablet Take 1 tablet (5 mg total) by mouth daily.   augmented betamethasone dipropionate (DIPROLENE-AF) 0.05 % ointment APPLY TOPICALLY TWICE DAILY   cariprazine (VRAYLAR) 3 MG capsule Take 1 capsule (3 mg total) by mouth daily.   clotrimazole-betamethasone (LOTRISONE) cream Apply 1 Application topically daily.   doxycycline (VIBRA-TABS) 100 MG tablet Take 1 tablet (100 mg total) by mouth 2 (two) times daily for 10 days.   folic acid (FOLVITE) 1 MG tablet Take 1 tablet (1 mg total) by mouth daily.   gabapentin (NEURONTIN) 300 MG capsule Take 1 capsule (300 mg total) by mouth 3 (three) times daily.   hydrOXYzine (ATARAX) 25 MG tablet Take 1 tablet (25 mg total) by mouth 3 (three) times daily as needed for anxiety   lisinopril (ZESTRIL) 40 MG tablet Take 1 tablet (40 mg total) by mouth daily.   metFORMIN (GLUCOPHAGE-XR) 500 MG 24 hr tablet Take 1 tablet (500 mg total) by mouth daily with breakfast.   Multiple Vitamin (MULTIVITAMIN WITH MINERALS) TABS tablet Take 1 tablet by mouth daily.   ofloxacin (FLOXIN) 0.3 % OTIC solution Place 5 drops into the left ear daily.   omeprazole (PRILOSEC) 20 MG capsule Take 1 capsule (20 mg total) by mouth daily.   PARoxetine (PAXIL) 10 MG tablet Take 1 tablet (10 mg total) by mouth daily.   rosuvastatin (CRESTOR) 20  MG tablet Take 1 tablet (20 mg total) by mouth daily.   silver sulfADIAZINE (SILVADENE) 1 % cream Apply 1 Application topically daily.   traZODone (DESYREL) 50 MG tablet Take 0.5-1 tablets (25-50 mg total) by mouth at bedtime as needed for sleep.   triamcinolone ointment (KENALOG) 0.5 % Apply 1 Application topically 2 (two) times daily.   hydrocerin (EUCERIN) CREA Apply 1 application. topically 3 (three) times daily. (Patient not taking: Reported on 04/14/2023)   ketoconazole (NIZORAL) 2 % cream Apply 1 Application  topically daily. (Patient not taking: Reported on 04/14/2023)   nicotine (NICODERM CQ - DOSED IN MG/24 HOURS) 14 mg/24hr patch Place 1 patch (14 mg total) onto the skin daily. (Patient not taking: Reported on 04/14/2023)   nicotine (NICODERM CQ - DOSED IN MG/24 HOURS) 21 mg/24hr patch Place 1 patch (21 mg total) onto the skin daily. (Patient not taking: Reported on 04/14/2023)   thiamine (VITAMIN B1) 100 MG tablet Take 1 tablet (100 mg total) by mouth daily. (Patient not taking: Reported on 04/14/2023)   [DISCONTINUED] hydrOXYzine (ATARAX) 25 MG tablet Take 1 tablet (25 mg total) by mouth 3 (three) times daily as needed for anxiety.   [DISCONTINUED] PARoxetine (PAXIL) 10 MG tablet Take 1 tablet (10 mg total) by mouth daily.   No facility-administered encounter medications on file as of 04/14/2023.    Review of Systems  Review of Systems  Constitutional: Negative.   HENT: Negative.    Cardiovascular: Negative.   Gastrointestinal: Negative.   Allergic/Immunologic: Negative.   Neurological: Negative.   Psychiatric/Behavioral: Negative.       Objective:   BP (!) 161/82   Pulse 92   Temp (!) 97.5 F (36.4 C)   Wt 165 lb 6.4 oz (75 kg)   SpO2 95%   BMI 27.52 kg/m   Wt Readings from Last 5 Encounters:  04/14/23 165 lb 6.4 oz (75 kg)  01/08/23 164 lb 12.8 oz (74.8 kg)  12/11/22 160 lb (72.6 kg)  10/20/22 158 lb (71.7 kg)  07/09/22 158 lb 3.2 oz (71.8 kg)     Physical Exam Vitals and nursing note reviewed.  Constitutional:      General: She is not in acute distress.    Appearance: She is well-developed.  Cardiovascular:     Rate and Rhythm: Normal rate and regular rhythm.  Pulmonary:     Effort: Pulmonary effort is normal.     Breath sounds: Normal breath sounds.  Neurological:     Mental Status: She is alert and oriented to person, place, and time.       Assessment & Plan:   Type 2 diabetes mellitus with chronic painful diabetic neuropathy (HCC) -     POCT  glycosylated hemoglobin (Hb A1C)  Frequency of urination -     POCT URINALYSIS DIP (CLINITEK)  Left ear pain -     Ofloxacin; Place 5 drops into the left ear daily.  Dispense: 5 mL; Refill: 0  Insomnia, unspecified type -     traZODone HCl; Take 0.5-1 tablets (25-50 mg total) by mouth at bedtime as needed for sleep.  Dispense: 30 tablet; Refill: 3  Other orders -     Doxycycline Hyclate; Take 1 tablet (100 mg total) by mouth 2 (two) times daily for 10 days.  Dispense: 20 tablet; Refill: 0     Return in about 6 months (around 10/12/2023).     Ivonne Andrew, NP 04/14/2023

## 2023-04-21 DIAGNOSIS — F411 Generalized anxiety disorder: Secondary | ICD-10-CM | POA: Diagnosis not present

## 2023-04-21 DIAGNOSIS — F251 Schizoaffective disorder, depressive type: Secondary | ICD-10-CM | POA: Diagnosis not present

## 2023-04-22 ENCOUNTER — Inpatient Hospital Stay: Admission: RE | Admit: 2023-04-22 | Payer: Medicaid Other | Source: Ambulatory Visit

## 2023-04-23 ENCOUNTER — Other Ambulatory Visit: Payer: Self-pay

## 2023-04-23 ENCOUNTER — Emergency Department (HOSPITAL_COMMUNITY): Payer: Medicaid Other

## 2023-04-23 ENCOUNTER — Emergency Department (HOSPITAL_COMMUNITY)
Admission: EM | Admit: 2023-04-23 | Discharge: 2023-04-24 | Disposition: A | Discharge status: Home / Self Care | Payer: Medicaid Other | Attending: Emergency Medicine | Admitting: Emergency Medicine

## 2023-04-23 ENCOUNTER — Other Ambulatory Visit (HOSPITAL_COMMUNITY): Payer: Self-pay

## 2023-04-23 ENCOUNTER — Encounter (HOSPITAL_COMMUNITY): Payer: Self-pay

## 2023-04-23 DIAGNOSIS — I10 Essential (primary) hypertension: Secondary | ICD-10-CM | POA: Diagnosis not present

## 2023-04-23 DIAGNOSIS — S8991XA Unspecified injury of right lower leg, initial encounter: Secondary | ICD-10-CM | POA: Diagnosis not present

## 2023-04-23 DIAGNOSIS — S0003XA Contusion of scalp, initial encounter: Secondary | ICD-10-CM | POA: Insufficient documentation

## 2023-04-23 DIAGNOSIS — M4802 Spinal stenosis, cervical region: Secondary | ICD-10-CM | POA: Diagnosis not present

## 2023-04-23 DIAGNOSIS — R519 Headache, unspecified: Secondary | ICD-10-CM | POA: Diagnosis present

## 2023-04-23 DIAGNOSIS — S0990XA Unspecified injury of head, initial encounter: Secondary | ICD-10-CM | POA: Diagnosis not present

## 2023-04-23 DIAGNOSIS — Z79899 Other long term (current) drug therapy: Secondary | ICD-10-CM | POA: Insufficient documentation

## 2023-04-23 DIAGNOSIS — S299XXA Unspecified injury of thorax, initial encounter: Secondary | ICD-10-CM | POA: Diagnosis not present

## 2023-04-23 DIAGNOSIS — S4991XA Unspecified injury of right shoulder and upper arm, initial encounter: Secondary | ICD-10-CM | POA: Diagnosis not present

## 2023-04-23 DIAGNOSIS — M47812 Spondylosis without myelopathy or radiculopathy, cervical region: Secondary | ICD-10-CM | POA: Diagnosis not present

## 2023-04-23 DIAGNOSIS — S199XXA Unspecified injury of neck, initial encounter: Secondary | ICD-10-CM | POA: Diagnosis not present

## 2023-04-23 DIAGNOSIS — S161XXA Strain of muscle, fascia and tendon at neck level, initial encounter: Secondary | ICD-10-CM | POA: Insufficient documentation

## 2023-04-23 DIAGNOSIS — M19011 Primary osteoarthritis, right shoulder: Secondary | ICD-10-CM | POA: Diagnosis not present
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
## 2023-04-24 ENCOUNTER — Other Ambulatory Visit (HOSPITAL_COMMUNITY): Payer: Self-pay

## 2023-04-24 MED ORDER — KETOROLAC TROMETHAMINE 60 MG/2ML IM SOLN
60.0000 mg | Freq: Once | INTRAMUSCULAR | Status: DC
  Filled 2023-04-24: qty 2

## 2023-04-24 MED ORDER — CYCLOBENZAPRINE HCL 10 MG PO TABS
10.0000 mg | ORAL_TABLET | Freq: Two times a day (BID) | ORAL | 0 refills | Status: DC | PRN
  Filled 2023-04-24 – 2023-07-28 (×2): qty 10, 5d supply, fill #0
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
## 2023-05-06 ENCOUNTER — Other Ambulatory Visit (HOSPITAL_COMMUNITY): Payer: Self-pay

## 2023-05-17 ENCOUNTER — Other Ambulatory Visit: Payer: Self-pay

## 2023-05-17 NOTE — Progress Notes (Signed)
 Attempted to contact patient x2 for scheduled appointment for medication management. VM box was full so unable to leave message. Will attempt to reschedule.  Jarrett Ables, PharmD PGY-1 Pharmacy Resident

## 2023-05-17 NOTE — Progress Notes (Deleted)
   05/17/2023 Name: JUHI LAGRANGE MRN: [id number] DOB: 1970/11/10  No chief complaint on file.   {Visit NWGN:56213}   Subjective:  Care Team: Primary Care Provider: Ivonne Andrew, NP ; Next Scheduled Visit: 07/09/2023 {careteamprovider:27366}  Medication Access/Adherence  Current Pharmacy:  [facility] [address] [place] [address] Phone: [phone] Fax: [phone]  [facility] [address] [place] [address] Phone: [phone] Fax: [phone]   Patient reports affordability concerns with their medications: {YES/NO:21197} Patient reports access/transportation concerns to their pharmacy: {YES/NO:21197} Patient reports adherence concerns with their medications:  {YES/NO:21197} ***   Hypertension:  Current medications: amlodipine 5 mg daily, lisinopril 40 mg daily Medications previously tried:   Patient {HAS/DOES NOT PIRJ:18841} a validated, automated, upper arm home BP cuff Current blood pressure readings readings: ***  Patient {Actions; denies-reports:120008} hypotensive s/sx including ***dizziness, lightheadedness.  Patient {Actions; denies-reports:120008} hypertensive symptoms including ***headache, chest pain, shortness of breath  Current meal patterns: ***  Current physical activity: ***   Hyperlipidemia/ASCVD Risk Reduction  Current lipid lowering medications: rosuvastatin 20 mg daily (adherence??) Medications tried in the past:   Antiplatelet regimen:   ASCVD History:  Family History:  Risk Factors:   Current physical activity: ***  Current medication access support: ***  Depression/Anxiety ***:  Current medications:  Medications tried in the past:   Behavioral Health support: ***  Current medication access support: ***  Tobacco Abuse:  Tobacco Use History: Age when started using tobacco on a daily basis *** Number of cigarettes per day  *** Smokes first cigarette *** minutes after waking {Does/does not:3044014::"Does","Does not"} wake at night to smoke Triggers include   Quit Attempt History: Most recent quit attempt *** Longest time ever been tobacco free *** Methods tried in the past include {CHL AMB [facility] MEDICATIONS FOR SMOKING CESSATION:20759}.  Rates IMPORTANCE of quitting tobacco on 1-10 scale of ***. Rates READINESS of quitting tobacco on 1-10 scale of ***. Rates CONFIDENCE of quitting tobacco on 1-10 scale of ***. Motivators to quitting include ***; barriers include {smoking cessation barriers:18118}   Current medication access support: ***   Objective:  Lab Results  Component Value Date   HGBA1C 5.7 (A) 04/14/2023    Lab Results  Component Value Date   CREATININE 0.67 01/08/2023   BUN 7 01/08/2023   NA 146 (H) 01/08/2023   K 4.0 01/08/2023   CL 107 (H) 01/08/2023   CO2 22 01/08/2023    Lab Results  Component Value Date   CHOL 318 (H) 07/09/2022   HDL 123 07/09/2022   LDLCALC 179 (H) 07/09/2022   TRIG 101 07/09/2022   CHOLHDL 2.6 07/09/2022    Medications Reviewed Today   Medications were not reviewed in this encounter       Assessment/Plan:   {Pharmacy A/P Choices:26421}  Follow Up Plan: ***  Jarrett Ables, PharmD PGY-1 Pharmacy Resident

## 2023-05-21 ENCOUNTER — Telehealth: Payer: Self-pay

## 2023-05-21 NOTE — Progress Notes (Signed)
 Complex Care Management Care Guide Note  05/21/2023 Name: DEEANA ATWATER MRN: [id number] DOB: [birth year]  KANIA REGNIER is a 53 y.o. year old female who is a primary care patient of Ivonne Andrew, NP and is actively engaged with the care management team. I reached out to Luvenia Redden by phone today to assist with re-scheduling  with the Pharmacist.  Follow up plan: Unsuccessful telephone outreach attempt made. A HIPAA compliant phone message was left for the patient providing contact information and requesting a return call.  Penne Lash , RMA     [facility]  [facility], [facility] Guide  Direct Dial: [phone]  Website: [email or web address]

## 2023-06-09 NOTE — Progress Notes (Signed)
 Complex Care Management Care Guide Note  06/09/2023 Name: Sharon Hunt MRN: [id number] DOB: [birth year]  Sharon Hunt is a 53 y.o. year old female who is a primary care patient of Ivonne Andrew, NP and is actively engaged with the care management team. I reached out to Luvenia Redden by phone today to assist with re-scheduling  with the Pharmacist.  Follow up plan: Telephone appointment with complex care management team member scheduled for:  06/16/2023  Penne Lash , RMA     [place]  [facility], [facility] Guide  Direct Dial: [phone]  Website: [email or web address]

## 2023-06-12 ENCOUNTER — Ambulatory Visit: Payer: Self-pay
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
## 2023-06-16 ENCOUNTER — Other Ambulatory Visit: Payer: Self-pay

## 2023-06-16 DIAGNOSIS — E1140 Type 2 diabetes mellitus with diabetic neuropathy, unspecified: Secondary | ICD-10-CM

## 2023-06-16 DIAGNOSIS — I10 Essential (primary) hypertension: Secondary | ICD-10-CM

## 2023-06-16 DIAGNOSIS — Z79899 Other long term (current) drug therapy: Secondary | ICD-10-CM

## 2023-06-16 NOTE — Progress Notes (Addendum)
 06/16/2023 Name: Sharon Hunt MRN: [id number] DOB: 10/26/1970  Chief Complaint  Patient presents with   Hypertension    Sharon Hunt is a 53 y.o. year old female who presented for a telephone visit.   They were referred to the pharmacist by their PCP for assistance in managing diabetes and hypertension.    Subjective:  Care Team: Primary Care Provider: Jerrlyn Morel, NP ; Next Scheduled Visit: 07/09/2023   Medication Access/Adherence  Current Pharmacy:  [place] [facility] [address] [place] [address] Phone: [phone] Fax: [phone]  [facility] [address] [place] [address] Phone: [phone] Fax: [phone]   Patient reports affordability concerns with their medications: No  She puts on the account at her pharmacy Patient reports access/transportation concerns to their pharmacy: No  Patient reports adherence concerns with their medications:  No    She reports experiencing housing insecurity and feels that I am causing her undue stress. At times, she becomes very anxious with any and all questions, leading to frustration when reviewing her allergy and medication history. When asked about her medications, she repeatedly states, 'The medication is in my chart,' and does not have all of her medications with her for review.   Diabetes:  Current medications: metformin  XR 500 mg daily    Current glucose readings: She does not check her blood glucose as she does not own a glucometer. She does not have the money for a CGM [facility]) and her son is not able to continue to pay for it .     Hypertension:  Current medications: amlodipine  5 mg daily, lisinopril  40 mg daily States that she is taking medication daily but the fill history on Dr. Anson Basta indicated patient has an inconsistent fill history. Requests refills for both medications.    Patient has a validated,  automated, upper arm home BP cuff, but is still in need of AAA batteries for device.  ---She reports that she frequently goes 3-4 days per week to [facility] and [employer] to check her blood pressure. Last blood pressure 157/92 mmHg when [place] and reported that she took her medication prior to blood pressure. She reported they continued with dental work with the above blood pressure reading. She denies caffeine, excessive physical activity but admits to standing for a long time leading up to getting her blood pressure checked for the dental procedure.    Hyperlipidemia/ASCVD Risk Reduction   Current lipid lowering medications: rosuvastatin  20 mg daily and requests refills  The ASCVD Risk score (Arnett DK, et al., 2019) failed to calculate for the following reasons:   The valid HDL cholesterol range is 20 to 100 mg/dL   Tobacco Abuse:  Tobacco Use History: Number of cigarettes per day is less than 1/2 PPD   Current medication access support: reports it could have initially nausea and diarrhea but she would like more patches. Her last patch was 1-2 weeks ago which she believes was the Nicotine  14 mg patch. Her last cigarette was yesterday and she "bums cigarettes from friends" which is about 1-2 per day.      Objective:  Lab Results  Component Value Date   HGBA1C 5.7 (A) 04/14/2023    Lab Results  Component Value Date   CREATININE 0.67 01/08/2023   BUN 7 01/08/2023   NA 146 (H) 01/08/2023   K 4.0 01/08/2023  CL 107 (H) 01/08/2023   CO2 22 01/08/2023    Lab Results  Component Value Date   CHOL 318 (H) 07/09/2022   HDL 123 07/09/2022   LDLCALC 179 (H) 07/09/2022   TRIG 101 07/09/2022   CHOLHDL 2.6 07/09/2022    Medications Reviewed Today   Medications were not reviewed in this encounter       Assessment/Plan:   Diabetes: - Currently controlled. Discussed the use of a glucometer for blood glucose monitoring; the  patient reported needle aversion. She prefers a continuous glucose monitor (CGM); however, she does not meet the criteria for CGM use, as she is not on insulin therapy. She mentioned that she may ask her daughter to check her blood glucose to avoid doing it herself.  - Recommend to check glucose as often as possible focusing on fasting blood glucose  - Recommend to continue current regimen at this time. Will collaborate with PCP for refills.   - Recommend a glucometer to check her blood glucose as much as possible. Will collaborate with PCP for diabetic testing supplies.     Hypertension: - Currently uncontrolled, influenced by factors such as medication adherence, medication access, stress, anxiety, and housing insecurity. Additionally, she reports that the previously provided batteries for her blood pressure monitor were incorrect; she still requires AAA batteries - Reviewed long term cardiovascular and renal outcomes of uncontrolled blood pressure - Reviewed appropriate blood pressure monitoring technique and reviewed goal blood pressure. Recommended to check home blood pressure and heart rate as often as possible - Recommend to continue current regimen. Will collaborate with PCP for refills, if they are needed.  - Will request for batteries for patient.   Hyperlipidemia/ASCVD Risk Reduction: - Currently uncontrolled due to medication access/adherence  - Recommend to continue current regimen. Will collaborate with PCP for refills, if they are needed.    Tobacco Abuse - Currently uncontrolled As patient reports she continues to smoke cigarettes and reported lapse in patches indicates patient may have adherence issues. Discussed adherence of patches. - Provided motivational interviewing to assess tobacco use and strategies for reduction - Provided information on 1 800 QUIT NOW support program but patient declined as she does not remember this information being provided  - Restart nicotine   patch 14 mg daily. Counseled on proper placement and potential side effects, including mild itching/redness at the location site, headache, trouble sleeping and/or vivid dreams. Advised to remove patch at night if development of trouble sleeping.  - Patch Schedule for >10 cigarettes daily: Apply one 14 mg patch daily for another 4 weeks (refill medication on file). Then, reduce to one 7 mg patch for another 2 weeks, if able.  - Will collaborate with PCP for prescription order for Nicotine  7 mg patches.  Depression - Encouraged patient to reconnect with [facility] and she Repeatedly declined stating they were the cause of her losing Medicaid or Medicare. She also became really frustrated that I mentioned [employer].   -She would like refills on paroxetine . Will reach out to prescribed, if refills are needed.    Medication Management:  -She reports that she endorses pain from [place] steps [place] church a few days a go and requests tramadol to help with back pain. Will defer pain management for PCP.   Follow Up Plan: Next PCP Scheduled Visit: 07/09/2023; pharmacist phone call in 6 weeks    Alexandria Angel, PharmD Clinical Pharmacist Cell: [phone]

## 2023-06-17 ENCOUNTER — Other Ambulatory Visit: Payer: Self-pay | Admitting: Nurse Practitioner

## 2023-06-17 ENCOUNTER — Other Ambulatory Visit (HOSPITAL_COMMUNITY): Payer: Self-pay

## 2023-06-17 DIAGNOSIS — E1140 Type 2 diabetes mellitus with diabetic neuropathy, unspecified: Secondary | ICD-10-CM

## 2023-06-17 NOTE — Progress Notes (Incomplete)
 06/16/2023 Name: Sharon Hunt MRN: [id number] DOB: 1970-08-19  Chief Complaint  Patient presents with  . Hypertension    Sharon Hunt is a 53 y.o. year old female who presented for a telephone visit.   They were referred to the pharmacist by their PCP for assistance in managing diabetes and hypertension.    Subjective:  Care Team: Primary Care Provider: Ivonne Andrew, NP ; Next Scheduled Visit: 07/09/2023   Medication Access/Adherence  Current Pharmacy:  [place] [facility] [address] [place] [address] Phone: [phone] Fax: [phone]  [facility] [address] [place] [address] Phone: [phone] Fax: [phone]   Patient reports affordability concerns with their medications: No  She puts on the account  Patient reports access/transportation concerns to their pharmacy: No  Patient reports adherence concerns with their medications:  No    She reports experiencing housing insecurity and feels that I am causing her undue stress. At times, she becomes very anxious with any and all questions, leading to frustration when reviewing her allergy and medication history. When asked about her medications, she repeatedly states, 'The medication is in my chart,' and does not have all of her medications with her for review.   Diabetes:  Current medications: metformin XR 500 mg daily    Current glucose readings: She does not check her blood glucose as she does not own a glucometer. She does not have the money for a CGM [facility]) and her son is not able to continue to pay for it .     Hypertension:  Current medications: amlodipine 5 mg daily, lisinopril 40 mg daily States that she is taking medication daily but the fill history on Dr. Tiajuana Amass indicated patient has an inconsistent fill history. Requests refills for both medications.    Patient has a validated, automated, upper  arm home BP cuff, but is still in need of AAA batteries for device.  ---She reports that she frequently goes 3-4 days per week to [facility] and [employer] to check her blood pressure. Last blood pressure 157/92 mmHg when she [place] and reported that she took her medication prior to blood pressure. She reported they continued with dental work with the above blood pressure reading. She denies caffeine, excessive physical activity but admits to standing for a long time leading up to getting her blood pressure checked for the dental procedure.    Hyperlipidemia/ASCVD Risk Reduction   Current lipid lowering medications: rosuvastatin 20 mg daily and requests refills  The ASCVD Risk score (Arnett DK, et al., 2019) failed to calculate for the following reasons:   The valid HDL cholesterol range is 20 to 100 mg/dL   Tobacco Abuse:  Tobacco Use History: Number of cigarettes per day is less than 1/2 PPD   Current medication access support: reports it could have initially nausea and diarrhea but she would like more patches. Her last patch was 1-2 weeks ago which she believes was the Nicotine 14 mg patch. Her last cigarette was yesterday and she "bums cigarettes from friends" which is about 1-2 per day.      Objective:  Lab Results  Component Value Date   HGBA1C 5.7 (A) 04/14/2023    Lab Results  Component Value Date   CREATININE 0.67 01/08/2023   BUN 7 01/08/2023   NA 146 (H) 01/08/2023   K 4.0 01/08/2023  CL 107 (H) 01/08/2023   CO2 22 01/08/2023    Lab Results  Component Value Date   CHOL 318 (H) 07/09/2022   HDL 123 07/09/2022   LDLCALC 179 (H) 07/09/2022   TRIG 101 07/09/2022   CHOLHDL 2.6 07/09/2022    Medications Reviewed Today   Medications were not reviewed in this encounter       Assessment/Plan:   Diabetes: - Currently controlled. Discussed the use of a glucometer for blood glucose monitoring; the patient reported  needle aversion. She prefers a continuous glucose monitor (CGM); however, she does not meet the criteria for CGM use, as she is not on insulin therapy. She mentioned that she may ask her daughter to check her blood glucose to avoid doing it herself.  - Recommend to check glucose as often as possible focusing on fasting blood glucose  - Recommend to continue current regimen at this time. Will collaborate with PCP for refills.   - Recommend a glucometer to check her blood glucose as much as possible. Will collaborate with PCP for diabetic testing supplies.     Hypertension: - Currently uncontrolled, influenced by factors such as medication adherence, medication access, stress, anxiety, and housing insecurity. Additionally, she reports that the previously provided batteries for her blood pressure monitor were incorrect; she still requires AAA batteries - Reviewed long term cardiovascular and renal outcomes of uncontrolled blood pressure - Reviewed appropriate blood pressure monitoring technique and reviewed goal blood pressure. Recommended to check home blood pressure and heart rate as often as possible - Recommend to continue current regimen. Will collaborate with PCP for refills.  - Will request for batteries for patient.   Hyperlipidemia/ASCVD Risk Reduction: - Currently uncontrolled due to medication access/adherence  - Recommend to continue current regimen. Will collaborate with PCP for refills.     Tobacco Abuse - Currently uncontrolled As patient reports she continues to smoke cigarettes and reported lapse in patches indicates patient may have adherence issues. Discussed adherence of patches. - Provided motivational interviewing to assess tobacco use and strategies for reduction - Provided information on 1 800 QUIT NOW support program but patient declined as she does not remember this information being provided  - Restart nicotine patch 14 mg daily. Counseled on proper placement and  potential side effects, including mild itching/redness at the location site, headache, trouble sleeping and/or vivid dreams. Advised to remove patch at night if development of trouble sleeping.  - Patch Schedule for >10 cigarettes daily: Apply one 14 mg patch daily for another 4 weeks (refill medication on file). Then, reduce to one 7 mg patch for another 2 weeks, if able.  - Will collaborate with PCP for prescription order for Nicotine 7 mg patches.  Depression - Encouraged patient to reconnect with [facility] and she Repeatedly declined stating they were the cause of her losing Medicaid or Medicare. She also became really frustrated that I mentioned [employer].   -She would like refills on paroxetine. Will reach out to prescribed, if refills are needed.    Medication Management:  -She reports that she endorses pain from [place] steps at church a few days a go and requests tramadol to help with back pain. Will defer pain management for PCP.   Follow Up Plan: Next PCP Scheduled Visit: 07/09/2023; pharmacist phone call in 6 weeks    Cephus Shelling, PharmD Clinical Pharmacist Cell: [phone]  Call pharmacy and order dm supplies and glucometer as well as  nicotine patches

## 2023-06-18 ENCOUNTER — Other Ambulatory Visit: Payer: Self-pay

## 2023-06-18 ENCOUNTER — Telehealth: Payer: Self-pay

## 2023-06-18 ENCOUNTER — Other Ambulatory Visit (HOSPITAL_COMMUNITY): Payer: Self-pay

## 2023-06-18 MED ORDER — METFORMIN HCL ER 500 MG PO TB24
500.0000 mg | ORAL_TABLET | Freq: Every day | ORAL | 0 refills | Status: DC
Start: 2023-06-18 — End: 2023-12-16
  Filled 2023-06-18: qty 90, 90d supply, fill #0

## 2023-06-18 MED ORDER — LANCET DEVICE MISC
1.0000 | Freq: Every day | 0 refills | Status: AC
  Filled 2023-06-18: qty 1, fill #0

## 2023-06-18 MED ORDER — ACCU-CHEK SOFTCLIX LANCETS MISC
0 refills | Status: AC
Start: 2023-06-18 — End: ?
  Filled 2023-06-18: qty 100, fill #0
  Filled 2023-06-18: qty 100, 90d supply, fill #0
  Filled 2023-07-28: qty 100, 34d supply, fill #0

## 2023-06-18 MED ORDER — BLOOD GLUCOSE TEST VI STRP
ORAL_STRIP | 0 refills | Status: AC
  Filled 2023-06-18: qty 100, 90d supply, fill #0
  Filled 2023-07-28: qty 100, 34d supply, fill #0
  Filled 2023-07-29: qty 100, 30d supply, fill #0

## 2023-06-18 MED ORDER — BLOOD GLUCOSE MONITOR SYSTEM W/DEVICE KIT
1.0000 | PACK | Freq: Every day | 0 refills | Status: DC
Start: 2023-06-18 — End: 2023-12-31
  Filled 2023-06-18 – 2023-07-28 (×2): qty 1, 30d supply, fill #0

## 2023-06-18 NOTE — Progress Notes (Signed)
   06/18/2023  Patient ID: Sharon Hunt, female   DOB: 09-20-1970, 53 y.o.   MRN: [id number]  Care Coordination Call   I contacted the pharmacy to refill requested medications. Vraylar 3mg  prescription was sent in [place] and patient has never picked up medication; previously noted in chart.   Cephus Shelling, PharmD Clinical Pharmacist Cell: [phone]

## 2023-06-30 ENCOUNTER — Other Ambulatory Visit (HOSPITAL_COMMUNITY): Payer: Self-pay

## 2023-07-09 ENCOUNTER — Ambulatory Visit: Payer: Self-pay | Admitting: Nurse Practitioner

## 2023-07-28 ENCOUNTER — Other Ambulatory Visit: Payer: Self-pay

## 2023-07-28 ENCOUNTER — Other Ambulatory Visit (HOSPITAL_COMMUNITY): Payer: Self-pay

## 2023-07-28 ENCOUNTER — Other Ambulatory Visit: Payer: Self-pay | Admitting: Nurse Practitioner

## 2023-07-28 DIAGNOSIS — H9202 Otalgia, left ear: Secondary | ICD-10-CM

## 2023-07-28 DIAGNOSIS — Z79899 Other long term (current) drug therapy: Secondary | ICD-10-CM

## 2023-07-28 DIAGNOSIS — L309 Dermatitis, unspecified: Secondary | ICD-10-CM

## 2023-07-28 NOTE — Progress Notes (Signed)
 07/28/2023 Name: Sharon Hunt MRN: [id number] DOB: 17-Jun-1970  Chief Complaint  Patient presents with   Diabetes   Hyperlipidemia    Sharon Hunt is a 53 y.o. year old female who presented for a telephone visit.   They were referred to the pharmacist by their PCP for assistance in managing diabetes and hypertension.    Subjective:  Care Team: Primary Care Provider: Jerrlyn Morel, NP ; Next Scheduled Visit: None  I spoke with Ms.Sharon Hunt today and she very energetic, pleasant.   Medication Access/Adherence  Current Hunt:  [place] [facility] [address] [place] [address] Phone: [phone] Fax: [phone]  Sharon Hunt [address] [place] [address] Phone: [phone] Fax: [phone]   Patient reports affordability concerns with their medications: No  Patient reports access/transportation concerns to their Hunt: Yes She was previously getting [facility] mailed medication. She has a car, but manual, and she does not like to drive because of her neuropathy Patient reports adherence concerns with their medications:  Yes  Admits that she does not take Nicotine  Patch regularly as she should    Diabetes:  Current medications: metformin  XR 500 mg daily  Using BGM meter; testing 0 times daily   Patient denies hypoglycemic s/sx including dizziness, shakiness, sweating. Patient reports hyperglycemic symptoms including neuropathy. She now wears glasses and is very excited to wear them   Hypertension:  Current medications:  amlodipine  5 mg daily, lisinopril  40 mg daily    Patient has a validated, automated, upper arm home BP cuff with working batteries Current blood pressure readings readings: 103/76 mmHg yesterday (after BP medication); HR 96  Patient denies hypotensive s/sx including dizziness, lightheadedness.  Patient denies hypertensive symptoms including  headache, chest pain, shortness of breath. SOB she reports it is related to anxiety or manic.   Reports that she reduced her salt intake    Hyperlipidemia/ASCVD Risk Reduction  Current lipid lowering medications: rosuvastatin  20 mg daily  The ASCVD Risk score (Arnett DK, et al., 2019) failed to calculate for the following reasons:   The valid HDL cholesterol range is 20 to 100 mg/dL   Tobacco Abuse:  The patient would like to pause smoking cessation therapy due to side effects and shared that she is not yet ready to quit. I reassured her that her decision to hold off will be documented clearly and that there is no pressure to proceed before she feels ready. She reported that the nicotine  patch causes increased bowel movements, while metformin  XR contributes to constipation. The patient currently smokes approximately 10 cigarettes per day when not using the nicotine  patch. When wearing the patch, she does not smoke at all.    Objective:  Lab Results  Component Value Date   HGBA1C 5.7 (A) 04/14/2023    Lab Results  Component Value Date   CREATININE 0.67 01/08/2023   BUN 7 01/08/2023   NA 146 (H) 01/08/2023   K 4.0 01/08/2023   CL 107 (H) 01/08/2023   CO2 22 01/08/2023    Lab Results  Component Value Date   CHOL 318 (H) 07/09/2022   HDL 123 07/09/2022   LDLCALC 179 (H) 07/09/2022   TRIG 101 07/09/2022   CHOLHDL 2.6 07/09/2022    Medications Reviewed Today     Reviewed by Sharon Hunt, RPH (Pharmacist) on 07/28/23 at 1331  Med List Status: <None>   Medication Order Taking? Sig Documenting Provider Last Dose  Status Informant  Accu-Chek Softclix Lancets lancets [id number] No Use to monitor blood sugar levels once daily.  Patient not taking: Reported on 07/28/2023   Sharon Morel, NP Not Taking Active            Med Note Sharon Hunt, Sharon Hunt   Tue Jul 28, 2023  1:16 PM) Would like to start using BGM  aluminum-magnesium  hydroxide-simethicone (MAALOX) 200-200-20  MG/5ML SUSP [id number] No Take 30 mLs by mouth in the morning and at bedtime.  Patient not taking: Reported on 07/28/2023   [provider] Not Taking Active Self           Med Note Sharon Hunt, Sharon Hunt   Tue Jul 28, 2023  1:15 PM) Taking PRN   amLODipine  (NORVASC ) 5 MG tablet [id number] Yes Take 1 tablet (5 mg total) by mouth daily. Sharon Morel, NP Taking Active   aspirin  81 MG chewable tablet [id number] Yes Chew 81 mg by mouth daily. [provider] Taking Active   augmented betamethasone  dipropionate (DIPROLENE -AF) 0.05 % ointment [id number] Yes APPLY TOPICALLY TWICE DAILY Sharon Morel, NP Taking Active   Blood Glucose Monitoring Suppl (BLOOD GLUCOSE MONITOR SYSTEM) w/Device KIT [id number] No Use to monitor blood sugar levels  Patient not taking: Reported on 07/28/2023   Sharon Morel, NP Not Taking Active   cariprazine  (VRAYLAR ) 3 MG capsule [id number] No Take 1 capsule (3 mg total) by mouth daily.  Patient not taking: Reported on 07/28/2023    Not Taking Active   cholecalciferol (VITAMIN D3) 25 MCG (1000 UNIT) tablet [id number] Yes Take 1,000 Units by mouth daily. [provider] Taking Active   clotrimazole -betamethasone  (LOTRISONE ) cream [id number] Yes Apply 1 Application topically daily. Sharon Hunt Taking Active   cyclobenzaprine  (FLEXERIL ) 10 MG tablet [id number] No Take 1 tablet (10 mg total) by mouth 2 (two) times daily as needed for muscle spasms.  Patient not taking: Reported on 07/28/2023   Sharon Patricia, MD Not Taking Active            Med Note Sharon Hunt, [facility]   Tue Jul 28, 2023  1:22 PM) Ran out   folic acid  (FOLVITE ) 1 MG tablet [id number] Yes Take 1 tablet (1 mg total) by mouth daily. Sharon Landau, MD Taking Active   gabapentin  (NEURONTIN ) 300 MG capsule [id number] Yes Take 1 capsule (300 mg total) by mouth 3 (three) times daily. Sharon Hunt Taking Active   Glucose Blood (BLOOD GLUCOSE TEST STRIPS) STRP [id number] No Use to  montior blood sugar levels once daily.  Patient not taking: Reported on 07/28/2023   Sharon Morel, NP Not Taking Active   hydrocerin (EUCERIN) CREA [id number] No Apply 1 application. topically 3 (three) times daily.  Patient not taking: Reported on 01/08/2023   Sharon Landau, MD Not Taking Active   hydrOXYzine  (ATARAX ) 25 MG tablet [id number] Yes Take 1 tablet (25 mg total) by mouth 3 (three) times daily as needed for anxiety  Taking Active   ibuprofen (ADVIL) 200 MG tablet [id number] Yes Take 200 mg by mouth daily as needed for mild pain (pain score 1-3). [provider] Taking Active   ketoconazole  (NIZORAL ) 2 % cream [id number] Yes Apply 1 Application topically daily. Sharon Hunt Taking Active   Lancet Device MISC [id number] No Use to monitor blood glucose.  Patient not taking: Reported on 07/28/2023   Sharon Morel, NP Not Taking Active   lisinopril  (ZESTRIL ) 40 MG tablet [id number] Yes Take 1  tablet (40 mg total) by mouth daily. Sharon Morel, NP Taking Active   metFORMIN  (GLUCOPHAGE -XR) 500 MG 24 hr tablet [id number] Yes Take 1 tablet (500 mg total) by mouth daily with breakfast. Nichols, Tonya S, NP Taking Active   Multiple Vitamin (MULTIVITAMIN WITH MINERALS) TABS tablet [id number] Yes Take 1 tablet by mouth daily. Sharon Morel, NP Taking Active   nicotine  (NICODERM CQ  - DOSED IN MG/24 HOURS) 14 mg/24hr patch [id number] Yes Place 1 patch (14 mg total) onto the skin daily. Sharon Morel, NP Taking Active            Med Note Sharon Hunt, [facility] Hunt   Tue Jul 28, 2023  1:20 PM) Not taking the medication regularly  nicotine  (NICODERM CQ  - DOSED IN MG/24 HOURS) 21 mg/24hr patch [id number] No Place 1 patch (21 mg total) onto the skin daily.  Patient not taking: Reported on 07/28/2023   Sharon Morel, NP Not Taking Active   ofloxacin  (FLOXIN ) 0.3 % OTIC solution [id number] No Place 5 drops into the left ear daily.  Patient not taking: Reported on 07/28/2023   Sharon Morel, NP Not Taking Active            Med Note [place] Hunt, [facility]   Tue Jul 28, 2023  1:28 PM) Reports that she never got this medication   omeprazole  (PRILOSEC) 20 MG capsule [id number] Yes Take 1 capsule (20 mg total) by mouth daily. Sharon Morel, NP Taking Active   PARoxetine  (PAXIL ) 10 MG tablet [id number] Yes Take 1 tablet (10 mg total) by mouth daily.  Taking Active   Potassium 99 MG TABS [id number] Yes Take by mouth daily. [provider] Taking Active   rosuvastatin  (CRESTOR ) 20 MG tablet [id number] Yes Take 1 tablet (20 mg total) by mouth daily. Sharon Morel, NP Taking Active   silver  sulfADIAZINE  (SILVADENE ) 1 % cream [id number] Yes Apply 1 Application topically daily. Sharon Morel, NP Taking Active   thiamine  (VITAMIN B1) 100 MG tablet [id number] Yes Take 1 tablet (100 mg total) by mouth daily. Sharon Morel, NP Taking Active   traZODone  (DESYREL ) 50 MG tablet [id number] No Take 0.5-1 tablets (25-50 mg total) by mouth at bedtime as needed for sleep.  Patient not taking: Reported on 07/28/2023   Sharon Morel, NP Not Taking Active   triamcinolone  ointment (KENALOG ) 0.5 % [id number] No Apply 1 Application topically 2 (two) times daily.  Patient not taking: Reported on 07/28/2023   Sharon Morel, NP Not Taking Active               Assessment/Plan:   Diabetes: - Currently controlled - Recommend to continue current regimen  - Recommend to check glucose once daily (fasting) - Patient did not receive diabetic testing supplies after our last encounter and would like to start checking her blood glucose at home.      Hypertension: - Currently uncontrolled based on office visit blood pressure reatings yet patient reported blood pressure reading was controlled. Confirmed with patient that she has "AAA" batteries.  Patient hesitant with dose adjustments  - Reviewed Hunt term cardiovascular and renal outcomes of uncontrolled blood pressure - Reviewed  appropriate blood pressure monitoring technique and reviewed goal blood pressure. Recommended to check home blood pressure and heart rate three times weekly - Recommend to continue current regimen as patient is very anxious with discussion of medication changes and prefers PCP. Will defer possible assessment for dose increase/add-on to PCP as  she is more comfortable with provider.   - Will call Hunt to dispense BGM and diabetic testing supplies.      Hyperlipidemia/ASCVD Risk Reduction: - Currently uncontrolled per last lipid panel.  - Reviewed Hunt term complications of uncontrolled cholesterol - Recommend to continue current regimen     Tobacco Abuse - Currently uncontrolled. Patient expressed that she is not interested in Nicotine  patches at this time. She reported that she inconsistently wear the patches as it causes diarrhea and still smokes cigarettes.  - Provided information on 1 800 QUIT NOW support program - May consider periodic follow up on smoking cessation assessment    Medication Management:  - She stated she would like Ofloxain refilled but Hunt needed provider to renew.  - Called the Hunt, with the patient on the phone, to get requested (amlodipine , lotrisone , cyclobenzaprine , otic drops, rosuvastatin , silver  sulfadiazine , trazodone , triamcinolone ) medications refilled  - Given patient mentioned transportation to Hunt is by her children, patient was asked about getting medications shipped and she declined for now - BGM and above medications had an insurance rejection that indicated patient may have another insurance aside from [employer]. Patient confirmed that she does not have another insurance and I provided the number to update Medicaid on insurance coverage. Patient was encouraged to call insurance. Otherwise, the prescriptions will not be billed through her insurance. Will follow up on cost of medications.    Time: 1 hr telephone call     Follow Up Plan: 4 weeks with PharmD (telephone)  Alexandria Angel, PharmD Clinical Pharmacist Cell: [phone]

## 2023-07-29 ENCOUNTER — Other Ambulatory Visit: Payer: Self-pay

## 2023-07-29 ENCOUNTER — Other Ambulatory Visit (HOSPITAL_COMMUNITY): Payer: Self-pay

## 2023-07-29 MED ORDER — SILVER SULFADIAZINE 1 % EX CREA
1.0000 | TOPICAL_CREAM | Freq: Every day | CUTANEOUS | 0 refills | Status: AC
  Filled 2023-07-29: qty 50, 34d supply, fill #0

## 2023-07-29 MED ORDER — OFLOXACIN 0.3 % OT SOLN
5.0000 [drp] | Freq: Every day | OTIC | 0 refills | Status: DC
  Filled 2023-07-29: qty 5, 20d supply, fill #0

## 2023-07-30 ENCOUNTER — Other Ambulatory Visit (HOSPITAL_COMMUNITY): Payer: Self-pay

## 2023-07-31 ENCOUNTER — Other Ambulatory Visit (HOSPITAL_COMMUNITY): Payer: Self-pay

## 2023-07-31 ENCOUNTER — Telehealth: Payer: Self-pay

## 2023-07-31 DIAGNOSIS — Z79899 Other long term (current) drug therapy: Secondary | ICD-10-CM

## 2023-07-31 NOTE — Progress Notes (Signed)
   07/31/2023  Patient ID: Pink Bridges, female   DOB: [birth year], 53 y.o.   MRN: [id number]  Care Coordination Call I called [facility] to verify the cost and was told $0 copay. Also, per fill history patient has not been taking lisinopril  and that medication was filled.   Alexandria Angel, PharmD Clinical Pharmacist Cell: [phone]

## 2023-10-06 ENCOUNTER — Other Ambulatory Visit (INDEPENDENT_AMBULATORY_CARE_PROVIDER_SITE_OTHER): Payer: Self-pay

## 2023-10-06 ENCOUNTER — Other Ambulatory Visit (HOSPITAL_COMMUNITY): Payer: Self-pay

## 2023-10-06 DIAGNOSIS — F1010 Alcohol abuse, uncomplicated: Secondary | ICD-10-CM

## 2023-10-06 DIAGNOSIS — E1140 Type 2 diabetes mellitus with diabetic neuropathy, unspecified: Secondary | ICD-10-CM

## 2023-10-06 NOTE — Progress Notes (Signed)
 10/06/2023 Name: Sharon Hunt MRN: [id number] DOB: 1970-11-01  Chief Complaint  Patient presents with   Diabetes   Hypertension   Hyperlipidemia    Sharon Hunt is a 53 y.o. year old female who presented for a telephone visit.   They were referred to the pharmacist by their PCP for assistance in managing diabetes and hypertension. PMH includes HTN, GERD with esophagitis, T2DM, neuropathy, alcohol and tobacco use, HLD, QT prolongation.   Subjective: Patient was last seen by PCP, Bascom Borer, NP, on 04/14/23. At last visit, BP was 150/93 mmHg, HR 92 bpm. A1C was controlled at 5.7%. No medication changes were made. Patient was last engaged by pharmacy via telephone on 07/28/23. She reported home BP reading on 103/76 mmHg. She was counseled on smoking cessation. The pharmacist attempted to request refills with the patient from the pharmacy, but they were getting a rejection on billing her Medicaid. Patient was given instructions to call Medicaid and update current insurance coverage.   Today, patient reports that she is doing ok. She is living in her car. She states that her medications are everywhere. She reports that it would be better for her if all her medications were due at the same time. She has used [facility]	[place] for mail order, which goes to her address on file, and her children let her know that it is ready. She states that her vision is not very good and she needs someone to help read the labels, but then she states that she fills up her own pill box. When asked what medicine she is taking she states heart burn, sugar, blood pressure. Throughout the visit she is not speaking coherently.   Care Team: Primary Care Provider: Borer Bascom RAMAN, NP ; Next Scheduled Visit: needs to be scheduled  Medication Access/Adherence  Current Pharmacy:  [place] [facility] [address], [address] [place] [address] Phone: [phone] Fax:  [phone]  [facility] - [facility] [address] [place] [address] Phone: [phone] Fax: [phone]   Patient reports affordability concerns with their medications: No  Patient reports access/transportation concerns to their pharmacy: Yes  - it is difficult for her to get to the pharmacy. Medications were last filled via mail order from [facility].  Patient reports adherence concerns with their medications:  Yes   - Metformin  last filled 10/24/2 for 90ds - Other maintenance medications were last filled at [facility] pharmacy on 07/31/23 for 90ds, but prior to this had not been filled since Dec 2024.   She is not able to speak on how many days a week she forgets her medications. She does use a pill box. Concerned for medications overheating in her car which could effect safety and effectiveness.    Diabetes:  Current medications: metformin  XR 500 mg daily with breakfast  Patient denies hypoglycemic s/sx including dizziness, shakiness, sweating. Patient denies hyperglycemic symptoms including polyuria, polydipsia, polyphagia, nocturia, neuropathy, blurred vision.  Current medication access support: BCBS ACA Plan + Medicaid  Hypertension:  Current medications: amlodipine  5 mg daily, lisinopril  40 mg daily, Potassium supplement on her list but unclear whether she is taking Medications previously tried:   Patient has a validated, automated, upper arm home BP cuff - however when she attempted to monitor her BP during our call today she stated it was dead.  Current blood pressure readings readings: N/A  Patient denies hypotensive s/sx including dizziness, lightheadedness.  Patient denies hypertensive symptoms including headache,  chest pain, shortness of breath   Hyperlipidemia/ASCVD Risk Reduction  Current lipid lowering medications: rosuvastatin  20 mg daily  Antiplatelet regimen: aspirin  81 mg daily  ASCVD History: none  Risk Factors: T2DM, tobacco  use  Clinical ASCVD: No  The ASCVD Risk score (Arnett DK, et al., 2019) failed to calculate for the following reasons:   The valid HDL cholesterol range is 20 to 100 mg/dL   Tobacco Abuse:  Previous history documented by Dorcas Solian, PharmD on 07/28/23 The patient would like to pause smoking cessation therapy due to side effects and shared that she is not yet ready to quit. I reassured her that her decision to hold off will be documented clearly and that there is no pressure to proceed before she feels ready. She reported that the nicotine  patch causes increased bowel movements, while metformin  XR contributes to constipation. The patient currently smokes approximately 10 cigarettes per day when not using the nicotine  patch. When wearing the patch, she does not smoke at all.  Objective:  BP Readings from Last 3 Encounters:  04/24/23 (!) 167/99  04/14/23 (!) 150/93  01/08/23 (!) 164/97    Lab Results  Component Value Date   HGBA1C 5.7 (A) 04/14/2023   HGBA1C 5.3 01/08/2023   HGBA1C 5.2 07/09/2022       Latest Ref Rng & Units 01/08/2023    8:54 AM 07/09/2022    9:17 AM 12/20/2021    4:12 PM  BMP  Glucose 70 - 99 mg/dL 98  894  308   BUN 6 - 24 mg/dL 7  13  6    Creatinine 0.57 - 1.00 mg/dL 9.32  9.14  9.16   BUN/Creat Ratio 9 - 23 10  15  7    Sodium 134 - 144 mmol/L 146  140  126   Potassium 3.5 - 5.2 mmol/L 4.0  4.0  4.1   Chloride 96 - 106 mmol/L 107  97  82   CO2 20 - 29 mmol/L 22  23  21    Calcium  8.7 - 10.2 mg/dL 9.2  88.8  9.5     Lab Results  Component Value Date   CHOL 318 (H) 07/09/2022   HDL 123 07/09/2022   LDLCALC 179 (H) 07/09/2022   TRIG 101 07/09/2022   CHOLHDL 2.6 07/09/2022    Medications Reviewed Today     Reviewed by Brinda Lorain SQUIBB, RPH (Pharmacist) on 10/06/23 at 1425  Med List Status: <None>   Medication Order Taking? Sig Documenting Provider Last Dose Status Informant  Accu-Chek Softclix Lancets lancets [id number] No Use to monitor blood sugar  levels once daily.  Patient not taking: Reported on 07/28/2023   Oley Bascom RAMAN, NP Not Taking Active            Med Note MAYER, [facility] R   Tue Jul 28, 2023  1:16 PM) Would like to start using BGM  aluminum-magnesium  hydroxide-simethicone (MAALOX) 200-200-20 MG/5ML SUSP [id number] No Take 30 mLs by mouth in the morning and at bedtime.  Patient not taking: Reported on 07/28/2023   [provider] Not Taking Active Self           Med Note MAYER, [facility] R   Tue Jul 28, 2023  1:15 PM) Taking PRN   amLODipine  (NORVASC ) 5 MG tablet [id number] No Take 1 tablet (5 mg total) by mouth daily. Oley Bascom RAMAN, NP Taking Active   aspirin  81 MG chewable tablet [id number] No Chew 81 mg by mouth daily. [provider] Taking  Active   augmented betamethasone  dipropionate (DIPROLENE -AF) 0.05 % ointment [id number] No APPLY TOPICALLY TWICE DAILY Oley Bascom RAMAN, NP Taking Active   Blood Glucose Monitoring Suppl (BLOOD GLUCOSE MONITOR SYSTEM) w/Device KIT [id number] No Use to monitor blood sugar levels  Patient not taking: Reported on 07/28/2023   Oley Bascom RAMAN, NP Not Taking Active   cariprazine  (VRAYLAR ) 3 MG capsule [id number] No Take 1 capsule (3 mg total) by mouth daily.  Patient not taking: Reported on 07/28/2023    Not Taking Active   cholecalciferol (VITAMIN D3) 25 MCG (1000 UNIT) tablet [id number] No Take 1,000 Units by mouth daily. [provider] Taking Active   clotrimazole -betamethasone  (LOTRISONE ) cream [id number] No Apply 1 Application topically daily. Joya Stabs, DPM Taking Active   cyclobenzaprine  (FLEXERIL ) 10 MG tablet [id number] No Take 1 tablet (10 mg total) by mouth 2 (two) times daily as needed for muscle spasms.  Patient not taking: Reported on 07/28/2023   Griselda Norris, MD Not Taking Active            Med Note MAYER, [facility] R   Tue Jul 28, 2023  1:22 PM) Ran out   folic acid  (FOLVITE ) 1 MG tablet [id number] No Take 1 tablet (1 mg total) by mouth daily.  Sebastian Toribio GAILS, MD Taking Active   gabapentin  (NEURONTIN ) 300 MG capsule [id number] No Take 1 capsule (300 mg total) by mouth 3 (three) times daily. Paseda, Folashade R, FNP Taking Active   Glucose Blood (BLOOD GLUCOSE TEST STRIPS) STRP [id number] No Use to monitor blood sugar levels once daily. Oley Bascom RAMAN, NP Not Taking Active   hydrocerin (EUCERIN) CREA [id number] No Apply 1 application. topically 3 (three) times daily.  Patient not taking: Reported on 01/08/2023   Sebastian Toribio GAILS, MD Not Taking Active   hydrOXYzine  (ATARAX ) 25 MG tablet [id number] No Take 1 tablet (25 mg total) by mouth 3 (three) times daily as needed for anxiety  Taking Active   ibuprofen (ADVIL) 200 MG tablet [id number] No Take 200 mg by mouth daily as needed for mild pain (pain score 1-3). [provider] Taking Active   ketoconazole  (NIZORAL ) 2 % cream [id number] No Apply 1 Application topically daily. Joya Stabs, DPM Taking Active   lisinopril  (ZESTRIL ) 40 MG tablet [id number] No Take 1 tablet (40 mg total) by mouth daily. Oley Bascom RAMAN, NP Taking Active   metFORMIN  (GLUCOPHAGE -XR) 500 MG 24 hr tablet [id number] No Take 1 tablet (500 mg total) by mouth daily with breakfast. Nichols, Tonya S, NP Taking Active   Multiple Vitamin (MULTIVITAMIN WITH MINERALS) TABS tablet [id number] No Take 1 tablet by mouth daily. Oley Bascom RAMAN, NP Taking Active   nicotine  (NICODERM CQ  - DOSED IN MG/24 HOURS) 14 mg/24hr patch [id number] No Place 1 patch (14 mg total) onto the skin daily. Oley Bascom RAMAN, NP Taking Active            Med Note MAYER, [facility] R   Tue Jul 28, 2023  1:20 PM) Not taking the medication regularly  nicotine  (NICODERM CQ  - DOSED IN MG/24 HOURS) 21 mg/24hr patch [id number] No Place 1 patch (21 mg total) onto the skin daily.  Patient not taking: Reported on 07/28/2023   Oley Bascom RAMAN, NP Not Taking Active   ofloxacin  (FLOXIN ) 0.3 % OTIC solution [id number]  Place 5 drops into the left ear daily.  Oley Bascom RAMAN, NP  Active   omeprazole  (PRILOSEC) 20 MG capsule [id number] No Take 1 capsule (20 mg total) by mouth  daily. Oley Bascom RAMAN, NP Taking Active   PARoxetine  (PAXIL ) 10 MG tablet [id number] No Take 1 tablet (10 mg total) by mouth daily.  Taking Active   Potassium 99 MG TABS [id number] No Take by mouth daily. [provider] Taking Active   rosuvastatin  (CRESTOR ) 20 MG tablet [id number] No Take 1 tablet (20 mg total) by mouth daily. Oley Bascom RAMAN, NP Taking Active   silver  sulfADIAZINE  (SSD) 1 % cream [id number]  Apply 1 Application topically daily. Oley Bascom RAMAN, NP  Active   thiamine  (VITAMIN B1) 100 MG tablet [id number] No Take 1 tablet (100 mg total) by mouth daily. Oley Bascom RAMAN, NP Taking Active   traZODone  (DESYREL ) 50 MG tablet [id number] No Take 0.5-1 tablets (25-50 mg total) by mouth at bedtime as needed for sleep.  Patient not taking: Reported on 07/28/2023   Oley Bascom RAMAN, NP Not Taking Active   triamcinolone  ointment (KENALOG ) 0.5 % [id number] No Apply 1 Application topically 2 (two) times daily.  Patient not taking: Reported on 07/28/2023   Oley Bascom RAMAN, NP Not Taking Active               Assessment/Plan:   Diabetes: - Currently controlled with most recent A1C of 5.7% below goal <7%. Medication adherence is not able to be assessed, though it is very unlikely patient is currently taking metformin . With most recent A1C of 5.7% on metformin  XR 500 mg daily, anticipate that current glycemic control is likely adequate - especially since patient denied s/sx of hyperglycemia. Patient would be eligible for SGLT2i given microalbuminuria, but unsure whether she is a good candidate given her current issues with adherence and possible barriers to good hygiene in her current living situation.  - Last UACR 07/09/22: 309 mg/g - Reviewed long term cardiovascular and renal outcomes of uncontrolled blood sugar - Reviewed goal A1c, goal fasting, and goal 2 hour  post prandial glucose - Reviewed dietary modifications including increasing intake of protein and non-starchy vegetables. Counseled patient to stay hydrated with water throughout the day. - Recommend to continue metformin  XR 500 mg daily. Attempted to refill - but still getting rejection at pharmacy indicated that [facility] coverage has terminated. Will attempt to do a three-way call with patient and Medicaid insurance at follow-up.  - Recommend to check glucose twice daily: fasting and 2-hr PPG . Counseled patient to bring glucometer or BG log to every appointment. - Next A1C due July 2025     Hypertension: - Currently uncontrolled with clinic BP consistently above goal less than 130/80. Patient is not having s/sx of hypo- or hyper-tension. Medication adherence appears appropriate based on fill hx, but likely that patient is frequently missing doses due to medications being organized in her car. Unable to make medication adjustments today due to inadequate medication history.  - Reviewed long term cardiovascular and renal outcomes of uncontrolled blood pressure - Reviewed appropriate blood pressure monitoring technique and reviewed goal blood pressure. Recommended to check home blood pressure and heart rate once daily and keep a log to bring to upcoming appointments. Confirmed that she does have batteries in her BP cuff. It may have been overheated today.  - Recommend to continue amlodipine  5 mg daily, lisinopril  40 mg daily   Hyperlipidemia/ASCVD Risk Reduction: - Currently uncontrolled with most recent LDL-C of 179 mg/dL above goal < 70 mg/dL given U7IF + comorbidities. High intensity statin indicated. Patient has not had repeat lipid panel since starting statin.  - Reviewed long term  complications of uncontrolled cholesterol - Recommend to continue rosuvastatin  20 mg daily.     Tobacco Abuse - Unable to assess today due to patient's lack of ability to engage in conversation. Will  continue to follow.    Will connect patient to Child psychotherapist for environmental and housing concerns. Helped patient schedule PCP appointment and will contact her via telephone before then to remind her to set up transportation.    Follow Up Plan:  PharmD telephone 10/30/23 PCP clinic visit 11/05/23   Lorain Baseman, PharmD [facility] [phone]

## 2023-10-07 ENCOUNTER — Telehealth: Payer: Self-pay | Admitting: *Deleted

## 2023-10-07 NOTE — Progress Notes (Unsigned)
 Complex Care Management Note Care Guide Note  10/07/2023 Name: Sharon Hunt MRN: [id number] DOB: 11/29/1970   Complex Care Management Outreach Attempts: An unsuccessful telephone outreach was attempted today to offer the patient information about available complex care management services.  Follow Up Plan:  Additional outreach attempts will be made to offer the patient complex care management information and services.   Encounter Outcome:  No Answer  Thedford Franks, CMA [place]  [facility], [facility] Guide Direct Dial: [phone]  Fax: [phone] Website: [email or web address]

## 2023-10-08 NOTE — Progress Notes (Signed)
 Complex Care Management Note  Care Guide Note 10/08/2023 Name: Sharon Hunt MRN: [id number] DOB: Oct 20, 1970  Sharon Hunt is a 53 y.o. year old female who sees Oley Bascom RAMAN, NP for primary care. I reached out to Earlean MALVA Pouch by phone today to offer complex care management services.  [name] was given information about Complex Care Management services today including:   The Complex Care Management services include support from the care team which includes your Nurse Care Manager, Clinical Social Worker, or Pharmacist.  The Complex Care Management team is here to help remove barriers to the health concerns and goals most important to you. Complex Care Management services are voluntary, and the patient may decline or stop services at any time by request to their care team member.   Complex Care Management Consent Status: Patient agreed to services and verbal consent obtained.   Follow up plan:  Telephone appointment with complex care management team member scheduled for:  10/20/2023  Encounter Outcome:  Patient Scheduled  Thedford Franks, CMA [place]  [facility], [facility] Guide Direct Dial: [phone]  Fax: [phone] Website: [email or web address]

## 2023-10-20 ENCOUNTER — Other Ambulatory Visit: Admitting: Licensed Clinical Social Worker

## 2023-10-30 ENCOUNTER — Telehealth: Payer: Self-pay

## 2023-10-30 ENCOUNTER — Other Ambulatory Visit: Payer: Self-pay

## 2023-10-30 NOTE — Progress Notes (Unsigned)
 10/30/2023 Name: Sharon Hunt MRN: [id number] DOB: 07/26/1970  No chief complaint on file.   Sharon Hunt is a 53 y.o. year old female who presented for a telephone visit.   They were referred to the pharmacist by their PCP for assistance in managing diabetes and hypertension. PMH includes HTN, GERD with esophagitis, T2DM, neuropathy, alcohol and tobacco use, HLD, QT prolongation.   Subjective: Patient was last seen by PCP, Bascom Borer, NP, on 04/14/23. At last visit, BP was 150/93 mmHg, HR 92 bpm. A1C was controlled at 5.7%. No medication changes were made. Patient was last engaged by pharmacy via telephone on 07/28/23. She reported home BP reading on 103/76 mmHg. She was counseled on smoking cessation. The pharmacist attempted to request refills with the patient from the pharmacy, but they were getting a rejection on billing her Medicaid. Patient was given instructions to call Medicaid and update current insurance coverage.   Today, patient reports that she is doing ok. She is living in her car. She states that her medications are everywhere. She reports that it would be better for her if all her medications were due at the same time. She has used [facility]	[place] for mail order, which goes to her address on file, and her children let her know that it is ready. She states that her vision is not very good and she needs someone to help read the labels, but then she states that she fills up her own pill box. When asked what medicine she is taking she states heart burn, sugar, blood pressure. Throughout the visit she is not speaking coherently.   Care Team: Primary Care Provider: Borer Bascom RAMAN, NP ; Next Scheduled Visit: needs to be scheduled  Medication Access/Adherence  Current Pharmacy:  [facility] [address], [address] [place] [address] Phone: [phone] Fax: [phone]  [facility] [address] [place] [address] Phone: [phone] Fax: [phone]   Patient reports affordability concerns with their medications: No  Patient reports access/transportation concerns to their pharmacy: Yes  - it is difficult for her to get to the pharmacy. Medications were last filled via mail order from [facility]  Patient reports adherence concerns with their medications:  Yes   - Metformin  last filled 10/24/2 for 90ds - Other maintenance medications were last filled at [facility] pharmacy on 07/31/23 for 90ds, but prior to this had not been filled since Dec 2024.   She is not able to speak on how many days a week she forgets her medications. She does use a pill box. Concerned for medications overheating in her car which could effect safety and effectiveness.    Diabetes:  Current medications: metformin  XR 500 mg daily with breakfast  Patient denies hypoglycemic s/sx including dizziness, shakiness, sweating. Patient denies hyperglycemic symptoms including polyuria, polydipsia, polyphagia, nocturia, neuropathy, blurred vision.  Current medication access support: BCBS ACA Plan + Medicaid  Hypertension:  Current medications: amlodipine  5 mg daily, lisinopril  40 mg daily, Potassium supplement on her list but unclear whether she is taking Medications previously tried:   Patient has a validated, automated, upper arm home BP cuff - however when she attempted to monitor her BP during our call today she stated it was dead.  Current blood pressure readings readings: N/A  Patient denies hypotensive s/sx including dizziness, lightheadedness.  Patient denies hypertensive symptoms including headache, chest pain, shortness of breath   Hyperlipidemia/ASCVD Risk Reduction  Current lipid lowering medications: rosuvastatin  20 mg daily  Antiplatelet regimen: aspirin  81 mg daily  ASCVD History: none  Risk Factors: T2DM, tobacco use  Clinical ASCVD: No  The ASCVD Risk score (Arnett DK, et al., 2019)  failed to calculate for the following reasons:   The valid HDL cholesterol range is 20 to 100 mg/dL   Tobacco Abuse:  Previous history documented by Dorcas Solian, PharmD on 07/28/23 The patient would like to pause smoking cessation therapy due to side effects and shared that she is not yet ready to quit. I reassured her that her decision to hold off will be documented clearly and that there is no pressure to proceed before she feels ready. She reported that the nicotine  patch causes increased bowel movements, while metformin  XR contributes to constipation. The patient currently smokes approximately 10 cigarettes per day when not using the nicotine  patch. When wearing the patch, she does not smoke at all.  Objective:  BP Readings from Last 3 Encounters:  04/24/23 (!) 167/99  04/14/23 (!) 150/93  01/08/23 (!) 164/97    Lab Results  Component Value Date   HGBA1C 5.7 (A) 04/14/2023   HGBA1C 5.3 01/08/2023   HGBA1C 5.2 07/09/2022       Latest Ref Rng & Units 01/08/2023    8:54 AM 07/09/2022    9:17 AM 12/20/2021    4:12 PM  BMP  Glucose 70 - 99 mg/dL 98  894  308   BUN 6 - 24 mg/dL 7  13  6    Creatinine 0.57 - 1.00 mg/dL 9.32  9.14  9.16   BUN/Creat Ratio 9 - 23 10  15  7    Sodium 134 - 144 mmol/L 146  140  126   Potassium 3.5 - 5.2 mmol/L 4.0  4.0  4.1   Chloride 96 - 106 mmol/L 107  97  82   CO2 20 - 29 mmol/L 22  23  21    Calcium  8.7 - 10.2 mg/dL 9.2  88.8  9.5     Lab Results  Component Value Date   CHOL 318 (H) 07/09/2022   HDL 123 07/09/2022   LDLCALC 179 (H) 07/09/2022   TRIG 101 07/09/2022   CHOLHDL 2.6 07/09/2022    Medications Reviewed Today   Medications were not reviewed in this encounter       Assessment/Plan:   Diabetes: - Currently controlled with most recent A1C of 5.7% below goal <7%. Medication adherence is not able to be assessed, though it is very unlikely patient is currently taking metformin . With most recent A1C of 5.7% on metformin  XR 500  mg daily, anticipate that current glycemic control is likely adequate - especially since patient denied s/sx of hyperglycemia. Patient would be eligible for SGLT2i given microalbuminuria, but unsure whether she is a good candidate given her current issues with adherence and possible barriers to good hygiene in her current living situation.  - Last UACR 07/09/22: 309 mg/g - Reviewed long term cardiovascular and renal outcomes of uncontrolled blood sugar - Reviewed goal A1c, goal fasting, and goal 2 hour post prandial glucose - Reviewed dietary modifications including increasing intake of protein and non-starchy vegetables. Counseled patient to stay hydrated with water throughout the day. - Recommend to continue metformin  XR 500 mg daily. Attempted to refill - but still getting rejection at pharmacy indicated that [facility] coverage has terminated. Will attempt to do a three-way call with patient and Medicaid insurance at follow-up.  - Recommend to check  glucose twice daily: fasting and 2-hr PPG . Counseled patient to bring glucometer or BG log to every appointment. - Next A1C due July 2025     Hypertension: - Currently uncontrolled with clinic BP consistently above goal less than 130/80. Patient is not having s/sx of hypo- or hyper-tension. Medication adherence appears appropriate based on fill hx, but likely that patient is frequently missing doses due to medications being organized in her car. Unable to make medication adjustments today due to inadequate medication history.  - Reviewed long term cardiovascular and renal outcomes of uncontrolled blood pressure - Reviewed appropriate blood pressure monitoring technique and reviewed goal blood pressure. Recommended to check home blood pressure and heart rate once daily and keep a log to bring to upcoming appointments. Confirmed that she does have batteries in her BP cuff. It may have been overheated today.  - Recommend to continue amlodipine  5  mg daily, lisinopril  40 mg daily   Hyperlipidemia/ASCVD Risk Reduction: - Currently uncontrolled with most recent LDL-C of 179 mg/dL above goal < 70 mg/dL given U7IF + comorbidities. High intensity statin indicated. Patient has not had repeat lipid panel since starting statin.  - Reviewed long term complications of uncontrolled cholesterol - Recommend to continue rosuvastatin  20 mg daily.     Tobacco Abuse - Unable to assess today due to patient's lack of ability to engage in conversation. Will continue to follow.    Will connect patient to Child psychotherapist for environmental and housing concerns. Helped patient schedule PCP appointment and will contact her via telephone before then to remind her to set up transportation.    Follow Up Plan:  PharmD telephone 10/30/23 PCP clinic visit 11/05/23   Lorain Baseman, PharmD [facility] [phone]

## 2023-10-30 NOTE — Progress Notes (Signed)
 Attempted to contact patient for scheduled appointment for medication management. Left HIPAA compliant message for patient to return my call at their convenience.   Lorain Baseman, PharmD [facility] [phone]

## 2023-11-03 ENCOUNTER — Other Ambulatory Visit: Payer: Self-pay | Admitting: Licensed Clinical Social Worker

## 2023-11-03 ENCOUNTER — Other Ambulatory Visit: Payer: Self-pay

## 2023-11-03 NOTE — Patient Instructions (Signed)
 Visit Information  Thank you for taking time to visit with me today. Please don't hesitate to contact me if I can be of assistance to you before our next scheduled appointment.  Care Guide to call client to schedule LCSW follow up call with client  Please call the care guide team at [phone] if you need to cancel or reschedule your appointment.   Following is a copy of your care plan:   Goals Addressed             This Visit's Progress    VBCI Social Work Care Plan       Problems:   Transport issues; financial issues; food scarcity issues; housing issues              Depression; anxiety             Lack of family support             Needs help in managing support in medical system  CSW Clinical Goal(s):   Over the next 30 days the Patient will attend all scheduled medical appointments as evidenced by patient report and care team review of appointment completion in electronic medical record.               Over next 30 days patient will explore community resources of support to help client with SDOH needs AEB patient report of support from community agencies  Interventions:  Spoke with client about client needs             Spoke with client about medication procurement              Spoke with client about housing needs. She said she is currently residing in her car. She has trouble buying gas for car and trouble with meeting financial needs.LCSW has spoken previously with client about local housing resources [facility], [employer])               She has food scarcity issues.  LCSW talked with client about [employer] and several other local food pantries. LCSW spoke of [employer] and encouraged client to visit that location to ask about food support from that church               Discussed support from NP Bascom GORMAN Borer. Client has appointment with NP this week              Discussed management of Diabetes. Client is trying to take medications as prescribed.                Discussed client insurance. She has Medicare and [place] Medicaid with [employer]             Encouraged client to call LCSW as needed for SW support at [phone]  Patient Goals/Self-Care Activities:  Attend appointments with NP as scheduled               Attend all scheduled medical appointments            Call LCSW as needed for SW support            Take prescribed medications as scheduled.             Call community resources of interest to inquire about possible help from these agencies   Plan:   Care Guide to call client to schedule LCSW follow up phone call with client        Please go to [facility]  Behavioral Health Urgent Care [address] [place] [phone]) if you are experiencing a Mental Health or Behavioral Health Crisis or need someone to talk to.  The patient verbalized understanding of instructions, educational materials, and care plan provided today and DECLINED offer to receive copy of patient instructions, educational materials, and care plan.    Glendia Pear  MSW, LCSW [place] Licensed Clinical Social Worker Direct Dial:  [phone] Fax:  [phone] Website:  [email or web address]

## 2023-11-03 NOTE — Patient Outreach (Signed)
 Complex Care Management   Visit Note  11/03/2023  Name:  Sharon Hunt: [id number] DOB: 09/07/1970  Situation: Referral received for Complex Care Management related to SDOH needs: housing, food, finances I obtained verbal consent from Patient.  Visit completed with Patient  on the phone  Background:   Past Medical History:  Diagnosis Date   GERD (gastroesophageal reflux disease)    H/O hiatal hernia    Hyperlipidemia    Hypertension    Type 2 diabetes mellitus with chronic painful diabetic neuropathy (HCC) 12/20/2021    Assessment: Patient Reported Symptoms:  Cognitive Cognitive Status: Alert and oriented to person, place, and time, Difficulties with attention and concentration Cognitive/Intellectual Conditions Management [RPT]: None reported or documented in medical history or problem list   Health Maintenance Behaviors: Annual physical exam, Stress management  Neurological Neurological Review of Symptoms: Weakness, Numbness (anxiety) Neurological Management Strategies: Coping strategies  HEENT HEENT Symptoms Reported: No symptoms reported HEENT Management Strategies: Coping strategies    Cardiovascular Cardiovascular Symptoms Reported: Fatigue Cardiovascular Management Strategies: Adequate rest, Coping strategies  Respiratory Respiratory Symptoms Reported: Shortness of breath Respiratory Management Strategies: Adequate rest, Coping strategies  Endocrine Endocrine Symptoms Reported: Shortness of breath, Weakness or fatigue Is patient diabetic?: Yes    Gastrointestinal Gastrointestinal Symptoms Reported: No symptoms reported Gastrointestinal Management Strategies: Adequate rest, Coping strategies    Genitourinary Genitourinary Symptoms Reported: No symptoms reported Genitourinary Management Strategies: Adequate rest, Coping strategies  Integumentary Integumentary Symptoms Reported: No symptoms reported Skin Management Strategies: Coping strategies  Musculoskeletal  Musculoskelatal Symptoms Reviewed: Weakness, Muscle pain Musculoskeletal Management Strategies: Coping strategies      Psychosocial Psychosocial Symptoms Reported: Anxiety - if selected complete GAD, Sadness - if selected complete PHQ 2-9, Depression - if selected complete PHQ 2-9 Behavioral Management Strategies: Coping strategies Major Change/Loss/Stressor/Fears (CP): Medical condition, self Techniques to Cope with Loss/Stress/Change: Counseling, Diversional activities, Support group Quality of Family Relationships: unable to assess    11/03/2023    PHQ2-9 Depression Screening   Little interest or pleasure in doing things Several days  Feeling down, depressed, or hopeless Several days  PHQ-2 - Total Score 2  Trouble falling or staying asleep, or sleeping too much Several days  Feeling tired or having little energy Several days  Poor appetite or overeating  Several days  Feeling bad about yourself - or that you are a failure or have let yourself or your family down Several days  Trouble concentrating on things, such as reading the newspaper or watching television Several days  Moving or speaking so slowly that other people could have noticed.  Or the opposite - being so fidgety or restless that you have been moving around a lot more than usual Several days  Thoughts that you would be better off dead, or hurting yourself in some way Not at all  PHQ2-9 Total Score 8  If you checked off any problems, how difficult have these problems made it for you to do your work, take care of things at home, or get along with other people Somewhat difficult  Depression Interventions/Treatment Medication, Counseling    There were no vitals filed for this visit.  Medications Reviewed Today     Reviewed by Frances Ozell GORMAN KEN (Social Worker) on 11/03/23 at 1352  Med List Status: <None>   Medication Order Taking? Sig Documenting Provider Last Dose Status Informant  Accu-Chek Softclix Lancets  lancets [id number] unknown Use to monitor blood sugar levels once daily.  Patient not taking: Reported on 11/03/2023  Oley Bascom RAMAN, NP  Active              aluminum-magnesium  hydroxide-simethicone (MAALOX) 200-200-20 MG/5ML SUSP [id number] unknown Take 30 mLs by mouth in the morning and at bedtime.  Patient not taking: Reported on 11/03/2023   [provider]  Active Self             amLODipine  (NORVASC ) 5 MG tablet [id number] Yes Take 1 tablet (5 mg total) by mouth daily. Oley Bascom RAMAN, NP  Active   aspirin  81 MG chewable tablet [id number] Yes Chew 81 mg by mouth daily. [provider]  Active   augmented betamethasone  dipropionate (DIPROLENE -AF) 0.05 % ointment [id number] Yes APPLY TOPICALLY TWICE DAILY Nichols, Tonya S, NP  Active   Blood Glucose Monitoring Suppl (BLOOD GLUCOSE MONITOR SYSTEM) w/Device KIT [id number] unknown Use to monitor blood sugar levels  Patient not taking: Reported on 11/03/2023   Oley Bascom RAMAN, NP  Active   cariprazine  (VRAYLAR ) 3 MG capsule [id number] Yes Take 1 capsule (3 mg total) by mouth daily.   Active   cholecalciferol (VITAMIN D3) 25 MCG (1000 UNIT) tablet [id number] Yes Take 1,000 Units by mouth daily. [provider]  Active   clotrimazole -betamethasone  (LOTRISONE ) cream [id number] Yes Apply 1 Application topically daily. Sikora, Rebecca, DPM  Active   cyclobenzaprine  (FLEXERIL ) 10 MG tablet [id number] unknown Take 1 tablet (10 mg total) by mouth 2 (two) times daily as needed for muscle spasms.  Patient not taking: Reported on 11/03/2023   Griselda Norris, MD  Active              folic acid  (FOLVITE ) 1 MG tablet [id number] unknown Take 1 tablet (1 mg total) by mouth daily. Sebastian Toribio GAILS, MD  Active   gabapentin  (NEURONTIN ) 300 MG capsule [id number] Yes Take 1 capsule (300 mg total) by mouth 3 (three) times daily. Paseda, Folashade R, FNP  Active   Glucose Blood (BLOOD GLUCOSE TEST STRIPS) STRP [id number] unknown Use to monitor  blood sugar levels once daily. Oley Bascom RAMAN, NP  Active   hydrocerin (EUCERIN) CREA [id number] unknown Apply 1 application. topically 3 (three) times daily.  Patient not taking: Reported on 11/03/2023   Sebastian Toribio GAILS, MD  Active   hydrOXYzine  (ATARAX ) 25 MG tablet [id number] Yes Take 1 tablet (25 mg total) by mouth 3 (three) times daily as needed for anxiety   Active   ibuprofen (ADVIL) 200 MG tablet [id number] Yes Take 200 mg by mouth daily as needed for mild pain (pain score 1-3). [provider]  Active   ketoconazole  (NIZORAL ) 2 % cream [id number] unknown Apply 1 Application topically daily. Sikora, Rebecca, DPM  Active   lisinopril  (ZESTRIL ) 40 MG tablet [id number] Yes Take 1 tablet (40 mg total) by mouth daily. Oley Bascom RAMAN, NP  Active   metFORMIN  (GLUCOPHAGE -XR) 500 MG 24 hr tablet [id number] Yes Take 1 tablet (500 mg total) by mouth daily with breakfast. Nichols, Tonya S, NP  Active   Multiple Vitamin (MULTIVITAMIN WITH MINERALS) TABS tablet [id number] Yes Take 1 tablet by mouth daily. Oley Bascom RAMAN, NP  Active   nicotine  (NICODERM CQ  - DOSED IN MG/24 HOURS) 14 mg/24hr patch [id number] Not taking  Place 1 patch (14 mg total) onto the skin daily.  Patient not taking: Reported on 11/03/2023   Oley Bascom RAMAN, NP  Active              nicotine  (NICODERM CQ  - DOSED IN MG/24 HOURS) 21 mg/24hr patch [id number]  Not taking  Place 1 patch (21 mg total) onto the skin daily.  Patient not taking: Reported on 11/03/2023   Oley Bascom RAMAN, NP  Active   ofloxacin  (FLOXIN ) 0.3 % OTIC solution [id number] Unknown  Place 5 drops into the left ear daily. Oley Bascom RAMAN, NP  Active   omeprazole  (PRILOSEC) 20 MG capsule [id number] Yes Take 1 capsule (20 mg total) by mouth daily. Oley Bascom RAMAN, NP  Active   PARoxetine  (PAXIL ) 10 MG tablet [id number] Yes Take 1 tablet (10 mg total) by mouth daily.   Active   Potassium 99 MG TABS [id number] Yes Take by mouth daily. [provider]  Active    rosuvastatin  (CRESTOR ) 20 MG tablet [id number] Yes Take 1 tablet (20 mg total) by mouth daily. Nichols, Tonya S, NP  Active   silver  sulfADIAZINE  (SSD) 1 % cream [id number] unknown Apply 1 Application topically daily. Oley Bascom RAMAN, NP  Active   thiamine  (VITAMIN B1) 100 MG tablet [id number] Not taking  Take 1 tablet (100 mg total) by mouth daily.  Patient not taking: Reported on 11/03/2023   Oley Bascom RAMAN, NP  Active   traZODone  (DESYREL ) 50 MG tablet [id number] Not taking  Take 0.5-1 tablets (25-50 mg total) by mouth at bedtime as needed for sleep.  Patient not taking: Reported on 11/03/2023   Oley Bascom RAMAN, NP  Active   triamcinolone  ointment (KENALOG ) 0.5 % [id number] unknown Apply 1 Application topically 2 (two) times daily.  Patient not taking: Reported on 07/28/2023   Oley Bascom RAMAN, NP  Active             Recommendation:   PCP Follow-up Continue Current Plan of Care Call LCSW as needed for SW support Call community resources of interest to inquire about possible help for client from these agencies.  Follow Up Plan:   Care Guide to call client to schedule LCSW follow up call with client    Glendia Pear  MSW, LCSW [place]/[facility] Licensed Clinical Social Worker Direct Dial:  [phone] Fax:  [phone] Website:  [email or web address]

## 2023-11-05 ENCOUNTER — Ambulatory Visit: Payer: Self-pay | Admitting: Nurse Practitioner

## 2023-11-09 ENCOUNTER — Other Ambulatory Visit (HOSPITAL_COMMUNITY): Payer: Self-pay

## 2023-11-09 ENCOUNTER — Ambulatory Visit: Payer: Self-pay

## 2023-11-09 NOTE — Telephone Encounter (Signed)
 FYI Only or Action Required?: FYI only for provider.  Patient was last seen in primary care on 04/14/2023 by Oley Bascom RAMAN, NP.  Called Nurse Triage reporting Foot Pain and Foot Swelling.  Symptoms began several days ago.  Interventions attempted: Other: dressing right leg boil with toilet paper and states she made a salt paste for the wound.  Symptoms are: bilateral leg swelling from knees to feet, worse on right leg, right leg abscess, bilateral foot pain gradually worsening.  Triage Disposition: See HCP Within 4 Hours (Or PCP Triage)  Patient/caregiver understands and will follow disposition?: Unsure            Copied from CRM #[id number]. Topic: Clinical - Red Word Triage >> Nov 09, 2023  2:41 PM Marissa P wrote: Red Word that prompted transfer to Nurse Triage: Patients feet are very swollen and lots of pain, needs to be seen as soon as possible please. Reason for Disposition  [1] Thigh, calf, or ankle swelling AND [2] bilateral AND [3] 1 side is more swollen  Answer Assessment - Initial Assessment Questions 1. ONSET: When did the swelling start? (e.g., minutes, hours, days)     She states she noticed 3-4 days ago, she noticed the indentions from her socks.  2. LOCATION: What part of the leg is swollen?  Are both legs swollen or just one leg?     Bilateral feet and ankles up to knees, she states her right leg is bigger than her left.  3. SEVERITY: How bad is the swelling? (e.g., localized; mild, moderate, severe)     Moderate.  4. REDNESS: Is there redness or signs of infection?     She states there are small red dots on her right leg. No warmth or drainage.  5. PAIN: Is the swelling painful to touch? If Yes, ask: How painful is it?   (Scale 1-10; mild, moderate or severe)     Yes, 10/10.  6. FEVER: Do you have a fever? If Yes, ask: What is it, how was it measured, and when did it start?      No.  7. CAUSE: What do you think is causing the leg  swelling?     She states she had a boil recently on right leg (size of a nickel and now down to the size of a dime), she states it popped on its own. She states she has been covering it with toilet paper. She states that is where the most swelling it located. She also states she has neuropathy which can cause some of the pain.  8. MEDICAL HISTORY: Do you have a history of blood clots (e.g., DVT), cancer, heart failure, kidney disease, or liver failure?     No.  9. RECURRENT SYMPTOM: Have you had leg swelling before? If Yes, ask: When was the last time? What happened that time?     No.  10. OTHER SYMPTOMS: Do you have any other symptoms? (e.g., chest pain, difficulty breathing)       She states she has a constant headache on the lower right/bottom of head. She states she sometimes was having chest pain that was relieved by sitting up and taking a deep breath.  11. PREGNANCY: Is there any chance you are pregnant? When was your last menstrual period?       N/A.  Protocols used: Leg Swelling and Edema-A-AH

## 2023-11-10 ENCOUNTER — Other Ambulatory Visit: Payer: Self-pay

## 2023-11-30 ENCOUNTER — Other Ambulatory Visit: Payer: Self-pay | Admitting: Licensed Clinical Social Worker

## 2023-12-03 ENCOUNTER — Ambulatory Visit: Payer: Self-pay | Admitting: Nurse Practitioner

## 2023-12-04 ENCOUNTER — Ambulatory Visit: Payer: Self-pay | Admitting: Nurse Practitioner

## 2023-12-10 ENCOUNTER — Telehealth: Payer: Self-pay | Admitting: *Deleted

## 2023-12-10 NOTE — Progress Notes (Signed)
 Complex Care Management Care Guide Note  12/10/2023 Name: Sharon Hunt MRN: [id number] DOB: 10-Dec-1970  Sharon Hunt is a 53 y.o. year old female who is a primary care patient of Oley Bascom RAMAN, NP and is actively engaged with the care management team. I reached out to Earlean MALVA Pouch by phone today to assist with re-scheduling  with the Licensed Clinical Social Worker BSW.  Follow up plan: Unsuccessful telephone outreach attempt made. No further outreach attempts will be made at this time. We have been unable to contact the patient to reschedule for complex care management services.   [name]  [facility]  [facility], [facility] Guide  Direct Dial: [phone]  Fax [phone]

## 2023-12-16 ENCOUNTER — Other Ambulatory Visit: Payer: Self-pay

## 2023-12-16 ENCOUNTER — Other Ambulatory Visit (HOSPITAL_COMMUNITY): Payer: Self-pay

## 2023-12-16 DIAGNOSIS — I10 Essential (primary) hypertension: Secondary | ICD-10-CM

## 2023-12-16 DIAGNOSIS — E1140 Type 2 diabetes mellitus with diabetic neuropathy, unspecified: Secondary | ICD-10-CM

## 2023-12-16 DIAGNOSIS — G629 Polyneuropathy, unspecified: Secondary | ICD-10-CM

## 2023-12-16 DIAGNOSIS — F419 Anxiety disorder, unspecified: Secondary | ICD-10-CM

## 2023-12-16 DIAGNOSIS — L309 Dermatitis, unspecified: Secondary | ICD-10-CM

## 2023-12-16 DIAGNOSIS — K219 Gastro-esophageal reflux disease without esophagitis: Secondary | ICD-10-CM

## 2023-12-16 DIAGNOSIS — G4700 Insomnia, unspecified: Secondary | ICD-10-CM

## 2023-12-16 NOTE — Progress Notes (Signed)
 12/16/2023 Name: ALEXARAE OLIVA MRN: [id number] DOB: 12-31-1970  Chief Complaint  Patient presents with   Medication Access    Neyah JOELEEN WORTLEY is a 53 y.o. year old female who presented for a telephone visit.   They were referred to the pharmacist by their PCP for assistance in managing diabetes and hypertension. PMH includes HTN, GERD with esophagitis, T2DM, neuropathy, alcohol and tobacco use, HLD, QT prolongation.   Subjective: Patient was last seen by PCP, Bascom Borer, NP, on 04/14/23. At last visit, BP was 150/93 mmHg, HR 92 bpm. A1C was controlled at 5.7%. No medication changes were made. Patient was last engaged by pharmacy via telephone on 07/28/23. She reported home BP reading on 103/76 mmHg. She was counseled on smoking cessation. The pharmacist attempted to request refills with the patient from the pharmacy, but they were getting a rejection on billing her Medicaid. Patient was given instructions to call Medicaid and update current insurance coverage. Patient was engaged by pharmacy again via telephone on 10/06/23. She reported living in her car and her medications were everywhere. I attempted to do a three way call with her and her Medicaid to troubleshoot insurance coverage, but I was not able to reach her for follow-up.   Today, patient reports that she is doing ok. She is no longer living in her car and has an apartment. She has her medication bottles with her, she has been rationing meds. Has a little bit metformin  and lisinopril  left. She is out of amlodipine , gabapentin , trazodone , omeprazole , and triamcinolone  cream. She reports that she recently got a Medicare [employer] card but has no prescription insurance. Reports that she gets disability payments.   Care Team: Primary Care Provider: Borer Bascom RAMAN, NP ; Next Scheduled Visit: needs to be scheduled  Medication Access/Adherence  Current Pharmacy:  [place] - [facility] [address], [address] [place] [address] Phone: [phone] Fax: [phone]  [place] - [facility] [address] [place] [address] Phone: [phone] Fax: [phone]  [facility] [place], [place] - [address] [place] [address] [place] [address] Phone: [phone] Fax: [phone]   Patient reports affordability concerns with their medications: No  Patient reports access/transportation concerns to their pharmacy: Yes  - it is difficult for her to get to the pharmacy. Medications were last filled via mail order from [facility]. Would like to use [facility] for mail order today. Patient reports adherence concerns with their medications:  Yes   - Metformin  last filled 01/08/23 for 90ds - Other maintenance medications were last filled at [facility] pharmacy on 07/31/23 for 90ds, but prior to this had not been filled since Dec 2024.    Objective:  BP Readings from Last 3 Encounters:  04/24/23 (!) 167/99  04/14/23 (!) 150/93  01/08/23 (!) 164/97    Lab Results  Component Value Date   HGBA1C 5.7 (A) 04/14/2023   HGBA1C 5.3 01/08/2023   HGBA1C 5.2 07/09/2022       Latest Ref Rng & Units 01/08/2023    8:54 AM 07/09/2022    9:17 AM 12/20/2021    4:12 PM  BMP  Glucose 70 - 99 mg/dL 98  894  308   BUN 6 - 24 mg/dL 7  13  6    Creatinine 0.57 - 1.00 mg/dL 9.32  9.14  9.16   BUN/Creat Ratio 9 - 23 10  15  7   Sodium 134 - 144 mmol/L 146  140  126   Potassium 3.5 - 5.2 mmol/L 4.0  4.0  4.1   Chloride 96 - 106 mmol/L 107  97  82   CO2 20 - 29 mmol/L 22  23  21    Calcium  8.7 - 10.2 mg/dL 9.2  88.8  9.5     Lab Results  Component Value Date   CHOL 318 (H) 07/09/2022   HDL 123 07/09/2022   LDLCALC 179 (H) 07/09/2022   TRIG 101 07/09/2022   CHOLHDL 2.6 07/09/2022    Medications Reviewed Today     Reviewed by Brinda Lorain SQUIBB, RPH (Pharmacist) on 12/16/23 at 1847  Med List Status: <None>   Medication  Order Taking? Sig Documenting Provider Last Dose Status Informant  Accu-Chek Softclix Lancets lancets [id number]  Use to monitor blood sugar levels once daily.  Patient not taking: Reported on 11/03/2023   Oley Bascom RAMAN, NP  Active            Med Note MAYER, [facility] R   Tue Jul 28, 2023  1:16 PM) Would like to start using BGM  aluminum-magnesium  hydroxide-simethicone (MAALOX) 200-200-20 MG/5ML SUSP [id number]  Take 30 mLs by mouth in the morning and at bedtime.  Patient not taking: Reported on 11/03/2023   [provider]  Active Self           Med Note MAYER, [facility] R   Tue Jul 28, 2023  1:15 PM) Taking PRN   amLODipine  (NORVASC ) 5 MG tablet [id number]  Take 1 tablet (5 mg total) by mouth daily. Oley Bascom RAMAN, NP  Active   aspirin  81 MG chewable tablet [id number]  Chew 81 mg by mouth daily. [provider]  Active   augmented betamethasone  dipropionate (DIPROLENE -AF) 0.05 % ointment [id number]  APPLY TOPICALLY TWICE DAILY Nichols, Tonya S, NP  Active   Blood Glucose Monitoring Suppl (BLOOD GLUCOSE MONITOR SYSTEM) w/Device KIT [id number]  Use to monitor blood sugar levels  Patient not taking: Reported on 11/03/2023   Oley Bascom RAMAN, NP  Active   cariprazine  (VRAYLAR ) 3 MG capsule [id number]  Take 1 capsule (3 mg total) by mouth daily.   Active   cholecalciferol (VITAMIN D3) 25 MCG (1000 UNIT) tablet [id number]  Take 1,000 Units by mouth daily. [provider]  Active   clotrimazole -betamethasone  (LOTRISONE ) cream [id number]  Apply 1 Application topically daily. Sikora, Rebecca, DPM  Active   cyclobenzaprine  (FLEXERIL ) 10 MG tablet [id number]  Take 1 tablet (10 mg total) by mouth 2 (two) times daily as needed for muscle spasms.  Patient not taking: Reported on 11/03/2023   Griselda Norris, MD  Active            Med Note MAYER, [facility] R   Tue Jul 28, 2023  1:22 PM) Ran out   folic acid  (FOLVITE ) 1 MG tablet [id number] No Take 1 tablet (1 mg total) by mouth daily.  Sebastian Toribio GAILS, MD Taking Active   gabapentin  (NEURONTIN ) 300 MG capsule [id number]  Take 1 capsule (300 mg total) by mouth 3 (three) times daily. Paseda, Folashade R, FNP  Active   Glucose Blood (BLOOD GLUCOSE TEST STRIPS) STRP [id number]  Use to monitor blood sugar levels once daily. Oley Bascom RAMAN, NP  Active   hydrocerin (EUCERIN) CREA [id number]  Apply 1 application. topically 3 (three) times daily.  Patient not taking: Reported on 11/03/2023   Sebastian Toribio GAILS, MD  Active   hydrOXYzine  (ATARAX ) 25  MG tablet [id number]  Take 1 tablet (25 mg total) by mouth 3 (three) times daily as needed for anxiety   Active   ibuprofen (ADVIL) 200 MG tablet [id number]  Take 200 mg by mouth daily as needed for mild pain (pain score 1-3). [provider]  Active   ketoconazole  (NIZORAL ) 2 % cream [id number]  Apply 1 Application topically daily. Sikora, Rebecca, DPM  Active   lisinopril  (ZESTRIL ) 40 MG tablet [id number]  Take 1 tablet (40 mg total) by mouth daily. Oley Bascom RAMAN, NP  Active   metFORMIN  (GLUCOPHAGE -XR) 500 MG 24 hr tablet [id number]  Take 1 tablet (500 mg total) by mouth daily with breakfast. Nichols, Tonya S, NP  Active   Multiple Vitamin (MULTIVITAMIN WITH MINERALS) TABS tablet [id number]  Take 1 tablet by mouth daily. Nichols, Tonya S, NP  Active   nicotine  (NICODERM CQ  - DOSED IN MG/24 HOURS) 14 mg/24hr patch [id number]  Place 1 patch (14 mg total) onto the skin daily.  Patient not taking: Reported on 11/03/2023   Oley Bascom RAMAN, NP  Active            Med Note MAYER, [facility] R   Tue Jul 28, 2023  1:20 PM) Not taking the medication regularly  nicotine  (NICODERM CQ  - DOSED IN MG/24 HOURS) 21 mg/24hr patch [id number]  Place 1 patch (21 mg total) onto the skin daily.  Patient not taking: Reported on 11/03/2023   Nichols, Tonya S, NP  Active   ofloxacin  (FLOXIN ) 0.3 % OTIC solution [id number]  Place 5 drops into the left ear daily. Oley Bascom RAMAN, NP  Active   omeprazole  (PRILOSEC) 20  MG capsule [id number]  Take 1 capsule (20 mg total) by mouth daily. Oley Bascom RAMAN, NP  Active   PARoxetine  (PAXIL ) 10 MG tablet [id number]  Take 1 tablet (10 mg total) by mouth daily.   Active   Potassium 99 MG TABS [id number]  Take by mouth daily. [provider]  Active   rosuvastatin  (CRESTOR ) 20 MG tablet [id number]  Take 1 tablet (20 mg total) by mouth daily. Oley Bascom RAMAN, NP  Active   silver  sulfADIAZINE  (SSD) 1 % cream [id number]  Apply 1 Application topically daily. Oley Bascom RAMAN, NP  Active   thiamine  (VITAMIN B1) 100 MG tablet [id number]  Take 1 tablet (100 mg total) by mouth daily.  Patient not taking: Reported on 11/03/2023   Oley Bascom RAMAN, NP  Active   traZODone  (DESYREL ) 50 MG tablet [id number]  Take 0.5-1 tablets (25-50 mg total) by mouth at bedtime as needed for sleep.  Patient not taking: Reported on 11/03/2023   Oley Bascom RAMAN, NP  Active   triamcinolone  ointment (KENALOG ) 0.5 % [id number]  Apply 1 Application topically 2 (two) times daily.  Patient not taking: Reported on 07/28/2023   Oley Bascom RAMAN, NP  Active               Assessment/Plan:   Medication Access:  She was agreeable to filling out Medicare extra help form today - I assisted her with this and it was submitted to SS. She was advised to look out for any communication from them in the mail. Will plan to use [facility] pharmacy for mail order while we wait to see if she can obtain prescription insurance.  - Helped her schedule a PCP appt which she wrote down and plans to have one of her children provide transportation for. Recommend getting repeat labs at PCP appt  on 01/21/24 (BMP, lipid panel, A1C, UACR) - Collaborated with PCP to provide refills of her maintenance medications   Follow Up Plan:  PharmD telephone 12/31/23 PCP clinic visit 01/21/24   Lorain Baseman, PharmD [facility] [phone]

## 2023-12-17 ENCOUNTER — Telehealth: Payer: Self-pay

## 2023-12-17 DIAGNOSIS — E1140 Type 2 diabetes mellitus with diabetic neuropathy, unspecified: Secondary | ICD-10-CM

## 2023-12-17 DIAGNOSIS — F419 Anxiety disorder, unspecified: Secondary | ICD-10-CM

## 2023-12-17 DIAGNOSIS — G4700 Insomnia, unspecified: Secondary | ICD-10-CM

## 2023-12-17 DIAGNOSIS — I10 Essential (primary) hypertension: Secondary | ICD-10-CM

## 2023-12-17 MED ORDER — PAROXETINE HCL 10 MG PO TABS
10.0000 mg | ORAL_TABLET | Freq: Every day | ORAL | 3 refills | Status: DC
Start: 2023-12-17 — End: 2023-12-22

## 2023-12-17 MED ORDER — LISINOPRIL 40 MG PO TABS: 40.0000 mg | ORAL_TABLET | Freq: Every day | ORAL | 3 refills | Status: DC

## 2023-12-17 MED ORDER — METFORMIN HCL ER 500 MG PO TB24: 500.0000 mg | ORAL_TABLET | Freq: Two times a day (BID) | ORAL | 3 refills | Status: DC

## 2023-12-17 MED ORDER — GABAPENTIN 300 MG PO CAPS: 300.0000 mg | ORAL_CAPSULE | Freq: Three times a day (TID) | ORAL | 3 refills | Status: AC

## 2023-12-17 MED ORDER — TRIAMCINOLONE ACETONIDE 0.5 % EX OINT
1.0000 | TOPICAL_OINTMENT | Freq: Two times a day (BID) | CUTANEOUS | 1 refills | Status: AC
Start: 2023-12-17 — End: ?

## 2023-12-17 MED ORDER — ROSUVASTATIN CALCIUM 20 MG PO TABS: 20.0000 mg | ORAL_TABLET | Freq: Every day | ORAL | 3 refills | Status: DC

## 2023-12-17 MED ORDER — AMLODIPINE BESYLATE 5 MG PO TABS: 5.0000 mg | ORAL_TABLET | Freq: Every day | ORAL | 1 refills | Status: DC

## 2023-12-17 MED ORDER — TRAZODONE HCL 50 MG PO TABS: 25.0000 mg | ORAL_TABLET | Freq: Every evening | ORAL | 3 refills | Status: DC | PRN

## 2023-12-17 MED ORDER — OMEPRAZOLE 20 MG PO CPDR
20.0000 mg | DELAYED_RELEASE_CAPSULE | Freq: Every day | ORAL | 3 refills | Status: DC
Start: 2023-12-17 — End: 2023-12-22

## 2023-12-17 NOTE — Telephone Encounter (Signed)
 Contacted [facility] who reported patient has not been enrolled in program since Oct 2024. She will need to re-enroll. Started application today, but need patient signature. If she is unable to provide financial documentation, she can be enrolled in generics only program.   Per pharmacy representative, of all the medications sent yesterday, the only one they are not able to fill on their donated medication formulary is gabapentin .   If unable to submit application in timely manner, will resend applications to [facility] pharmacy and request mail delivery - though I do not believe they have as many medications in stock via [facility] as are available at [facility].   Will follow-up next week.   Lorain Baseman, PharmD [facility] [phone]

## 2023-12-22 ENCOUNTER — Other Ambulatory Visit: Payer: Self-pay

## 2023-12-22 ENCOUNTER — Other Ambulatory Visit (HOSPITAL_COMMUNITY): Payer: Self-pay

## 2023-12-22 MED ORDER — AMLODIPINE BESYLATE 5 MG PO TABS
5.0000 mg | ORAL_TABLET | Freq: Every day | ORAL | 1 refills | Status: AC
  Filled 2023-12-22: qty 90, 90d supply, fill #0

## 2023-12-22 MED ORDER — TRAZODONE HCL 100 MG PO TABS
50.0000 mg | ORAL_TABLET | Freq: Every evening | ORAL | 3 refills | Status: AC | PRN
  Filled 2023-12-22: qty 45, 90d supply, fill #0

## 2023-12-22 MED ORDER — ATORVASTATIN CALCIUM 40 MG PO TABS
40.0000 mg | ORAL_TABLET | Freq: Every day | ORAL | 3 refills | Status: AC
  Filled 2023-12-22: qty 90, 90d supply, fill #0

## 2023-12-22 MED ORDER — METFORMIN HCL 500 MG PO TABS
500.0000 mg | ORAL_TABLET | Freq: Two times a day (BID) | ORAL | 3 refills | Status: AC
  Filled 2023-12-22: qty 180, 90d supply, fill #0

## 2023-12-22 MED ORDER — PAROXETINE HCL 20 MG PO TABS
10.0000 mg | ORAL_TABLET | Freq: Every day | ORAL | 3 refills | Status: AC
Start: 2023-12-22 — End: ?
  Filled 2023-12-22: qty 45, 90d supply, fill #0

## 2023-12-22 MED ORDER — LISINOPRIL 40 MG PO TABS
40.0000 mg | ORAL_TABLET | Freq: Every day | ORAL | 3 refills | Status: AC
  Filled 2023-12-22: qty 90, 90d supply, fill #0

## 2023-12-22 NOTE — Telephone Encounter (Signed)
 Given difficulty obtaining patient's signature for [facility] application, resent applicable medicines to be mailed out from [facility] pharmacy at [facility] via [facility] supply. This required substitution of metformin  XR to metformin  IR (patient reported occasional diarrhea, non severe to IR meformin in the past) and substituting rosuvastatin  for atorvastatin .   Can revisit using  [facility] for larger [facility] formulary in the future.   Lorain Baseman, PharmD [facility] [phone]

## 2023-12-31 ENCOUNTER — Other Ambulatory Visit: Payer: Self-pay

## 2023-12-31 DIAGNOSIS — E1140 Type 2 diabetes mellitus with diabetic neuropathy, unspecified: Secondary | ICD-10-CM

## 2023-12-31 DIAGNOSIS — I10 Essential (primary) hypertension: Secondary | ICD-10-CM

## 2023-12-31 NOTE — Progress Notes (Signed)
 12/31/2023 Name: Sharon Hunt MRN: [id number] DOB: Mar 24, 1970  Chief Complaint  Patient presents with   Medication Access    Sharon Hunt is a 53 y.o. year old female who presented for a telephone visit.   They were referred to the pharmacist by their PCP for assistance in managing diabetes and hypertension. PMH includes HTN, GERD with esophagitis, T2DM, neuropathy, alcohol and tobacco use, HLD, QT prolongation.   Subjective: Patient was last seen by PCP, Sharon Borer, NP, on 04/14/23. At last visit, BP was 150/93 mmHg, HR 92 bpm. A1C was controlled at 5.7%. No medication changes were made. Patient was last engaged by pharmacy via telephone on 07/28/23. She reported home BP reading on 103/76 mmHg. She was counseled on smoking cessation. The pharmacist attempted to request refills with the patient from the pharmacy, but they were getting a rejection on billing her Medicaid. Patient was given instructions to call Medicaid and update current insurance coverage. Patient was engaged by pharmacy again via telephone on 10/06/23. She reported living in her car and her medications were everywhere. I attempted to do a three way call with her and her Medicaid to troubleshoot insurance coverage, but I was not able to reach her for follow-up. Patient had pharmacy telephone f/u on 12/16/23. She reported she had an apartment now, had been rationing her medications. We attempted to fill her meds with [facility] but enrollment had expired, therefore had a limited number of her meds filled and sent out from [facility] pharmacy (more limited DOH stock). Also assisted patient in applying to Medicare LIS.   Today, patient reports doing ok. She had just woken up so was not willing to do a full telephone visit. However, she did confirm that she had received her medications in the mail at her new address (atorvastatin , metformin , paroxetine , trazodone , amlodipine , lisinopril ). Confirmed that she has the date/time of her  PCP appt written down. She anticipates that her daughter will be able to help her get to this appt. She does not have updates on her insurance today.  Care Team: Primary Care Provider: Borer Sharon RAMAN, NP ; Next Scheduled Visit: needs to be scheduled  Medication Access/Adherence  Current Pharmacy:  [place] - [facility] [address] [place] [address] Phone: [phone] Fax: [phone]  [place] - [facility] [address] [place] [address] Phone: [phone] Fax: [phone]  [facility] [address] [place] [address] Phone: [phone] Fax: [phone]   Patient reports affordability concerns with their medications: Yes  - currently uninsured, unclear if she may be gaining Medicare through disability soon Patient reports access/transportation concerns to their pharmacy: Yes  - it is difficult for her to get to the pharmacy. Last filled medications via [facility] supply (mail order due to lack of transportation) Patient reports adherence concerns with their medications:  Yes  - due to financial barriers   Objective:  BP Readings from Last 3 Encounters:  04/24/23 (!) 167/99  04/14/23 (!) 150/93  01/08/23 (!) 164/97    Lab Results  Component Value Date   HGBA1C 5.7 (A) 04/14/2023   HGBA1C 5.3 01/08/2023   HGBA1C 5.2 07/09/2022       Latest Ref Rng & Units 01/08/2023    8:54 AM 07/09/2022    9:17 AM 12/20/2021    4:12 PM  BMP  Glucose 70 - 99 mg/dL 98  894  691   BUN 6 - 24 mg/dL 7  13  6    Creatinine 0.57 - 1.00 mg/dL 9.32  9.14  9.16   BUN/Creat Ratio 9 - 23 10  15  7    Sodium 134 - 144 mmol/L 146  140  126   Potassium 3.5 - 5.2 mmol/L 4.0  4.0  4.1   Chloride 96 - 106 mmol/L 107  97  82   CO2 20 - 29 mmol/L 22  23  21    Calcium  8.7 - 10.2 mg/dL 9.2  88.8  9.5     Lab Results  Component Value Date   CHOL 318 (H)  07/09/2022   HDL 123 07/09/2022   LDLCALC 179 (H) 07/09/2022   TRIG 101 07/09/2022   CHOLHDL 2.6 07/09/2022    Medications Reviewed Today     Reviewed by Sharon Hunt, RPH (Pharmacist) on 12/31/23 at 1024  Med List Status: <None>   Medication Order Taking? Sig Documenting Provider Last Dose Status Informant  Accu-Chek Softclix Lancets lancets [id number]  Use to monitor blood sugar levels once daily.  Patient not taking: Reported on 11/03/2023   Sharon Sharon RAMAN, NP  Active            Med Note MAYER, [facility] R   Tue Jul 28, 2023  1:16 PM) Would like to start using BGM  amLODipine  (NORVASC ) 5 MG tablet [id number] Yes Take 1 tablet (5 mg total) by mouth daily. Sharon Sharon RAMAN, NP  Active   aspirin  81 MG chewable tablet [id number]  Chew 81 mg by mouth daily. [provider]  Active   atorvastatin  (LIPITOR) 40 MG tablet [id number] Yes Take 1 tablet (40 mg total) by mouth daily. Sharon Sharon RAMAN, NP  Active    Patient not taking:   Discontinued 12/31/23 1024 (No longer needed (for PRN medications))   cariprazine  (VRAYLAR ) 3 MG capsule [id number]  Take 1 capsule (3 mg total) by mouth daily.   Active   cholecalciferol (VITAMIN D3) 25 MCG (1000 UNIT) tablet [id number]  Take 1,000 Units by mouth daily. [provider]  Active   clotrimazole -betamethasone  (LOTRISONE ) cream [id number]  Apply 1 Application topically daily. Sikora, Sharon Hunt  Active   gabapentin  (NEURONTIN ) 300 MG capsule [id number]  Take 1 capsule (300 mg total) by mouth 3 (three) times daily. Sharon Sharon RAMAN, NP  Active   Glucose Blood (BLOOD GLUCOSE TEST STRIPS) STRP [id number]  Use to monitor blood sugar levels once daily. Sharon Sharon RAMAN, NP  Active   hydrOXYzine  (ATARAX ) 25 MG tablet [id number]  Take 1 tablet (25 mg total) by mouth 3 (three) times daily as needed for anxiety   Active   ibuprofen (ADVIL) 200 MG tablet [id number]  Take 200 mg by mouth daily as needed for mild pain (pain score 1-3). [provider]  Active   ketoconazole  (NIZORAL ) 2 % cream [id number]  Apply 1 Application topically daily. Sikora, Sharon Hunt  Active   lisinopril  (ZESTRIL ) 40 MG tablet [id number] Yes Take 1 tablet (40 mg total) by mouth daily. Sharon Sharon RAMAN, NP  Active   metFORMIN  (GLUCOPHAGE ) 500 MG tablet [id number] Yes Take 1 tablet (500 mg total) by mouth 2 (two) times daily with a meal. Nichols, Tonya S, NP  Active   Multiple Vitamin (MULTIVITAMIN WITH MINERALS) TABS tablet [id number]  Take 1 tablet by mouth daily. Nichols, Tonya S, NP  Active   nicotine  (NICODERM CQ  - DOSED IN MG/24 HOURS) 14 mg/24hr patch [id number]  Place  1 patch (14 mg total) onto the skin daily.  Patient not taking: Reported on 11/03/2023   Sharon Sharon RAMAN, NP  Active            Med Note MAYER, [facility] R   Tue Jul 28, 2023  1:20 PM) Not taking the medication regularly  nicotine  (NICODERM CQ  - DOSED IN MG/24 HOURS) 21 mg/24hr patch [id number]  Place 1 patch (21 mg total) onto the skin daily.  Patient not taking: Reported on 11/03/2023   Sharon Sharon RAMAN, NP  Active   PARoxetine  (PAXIL ) 20 MG tablet [id number] Yes Take 0.5 tablets (10 mg total) by mouth daily. Sharon Sharon RAMAN, NP  Active   silver  sulfADIAZINE  (SSD) 1 % cream [id number]  Apply 1 Application topically daily. Sharon Sharon RAMAN, NP  Active   traZODone  (DESYREL ) 100 MG tablet [id number] Yes Take 0.5 tablets (50 mg total) by mouth at bedtime as needed for sleep. Sharon Sharon RAMAN, NP  Active   triamcinolone  ointment (KENALOG ) 0.5 % [id number]  Apply 1 Application topically 2 (two) times daily. Sharon Sharon RAMAN, NP  Active               Assessment/Plan:   Medication Access:  - At last visit on 12/31/23 - assisted in filling out form for Medicare Extra Help, as patient reported that she recently got a Medicare Red/white/blue card. She was instructed to look out for communication from the social security office in the mail.  - Currently using [facility] supply for mail order for  medications. At PCP f/u will have her sign forms for [facility], as they have a larger DOH formularly.  - Confirmed patient plans to attend PCP appt on 01/21/24. Recommend getting BMP, lipid panel, A1C and UACR at PCP appt.   Follow Up Plan:  PharmD telephone 02/03/24 PCP clinic visit 01/21/24   Lorain Baseman, PharmD [facility] [phone]

## 2024-01-21 ENCOUNTER — Ambulatory Visit: Payer: Self-pay | Admitting: Nurse Practitioner

## 2024-01-22 ENCOUNTER — Ambulatory Visit: Payer: Self-pay | Admitting: Nurse Practitioner

## 2024-02-03 ENCOUNTER — Other Ambulatory Visit: Payer: Self-pay

## 2024-02-03 ENCOUNTER — Other Ambulatory Visit (HOSPITAL_COMMUNITY): Payer: Self-pay

## 2024-02-03 NOTE — Progress Notes (Signed)
 02/03/2024 Name: AVERILL WINTERS MRN: [id number] DOB: [birth year]  Chief Complaint  Patient presents with   Medication Access    Parker NAYLEEN JANOSIK is a 53 y.o. year old female who presented for a telephone visit.   They were referred to the pharmacist by their PCP for assistance in managing diabetes and hypertension. PMH includes HTN, GERD with esophagitis, T2DM, neuropathy, alcohol and tobacco use, HLD, QT prolongation.   Subjective: Patient was last seen by PCP, Bascom Borer, NP, on 04/14/23. At last visit, BP was 150/93 mmHg, HR 92 bpm. A1C was controlled at 5.7%. No medication changes were made. Patient was last engaged by pharmacy via telephone on 07/28/23. She reported home BP reading on 103/76 mmHg. She was counseled on smoking cessation. The pharmacist attempted to request refills with the patient from the pharmacy, but they were getting a rejection on billing her Medicaid. Patient was given instructions to call Medicaid and update current insurance coverage. Patient was engaged by pharmacy again via telephone on 10/06/23. She reported living in her car and her medications were everywhere. I attempted to do a three way call with her and her Medicaid to troubleshoot insurance coverage, but I was not able to reach her for follow-up. Patient had pharmacy telephone f/u on 12/16/23. She reported she had an apartment now, had been rationing her medications. We attempted to fill her meds with [facility] but enrollment had expired, therefore had a limited number of her meds filled and sent out from [facility] pharmacy (more limited DOH stock). Also assisted patient in applying to Medicare LIS. At bief telephone f/u on 12/31/23, patient reported she received her medications from [facility] pharmacy in the mail. Unfortunately, she did not show to PCP appt scheduled for 01/21/24 or 01/22/24.  Today, patient reports doing ok. Willing to reschedule PCP appt but requests letter in the mail with appt date and  time.  Care Team: Primary Care Provider: Borer Bascom RAMAN, NP ; Next Scheduled Visit: needs to be scheduled  Medication Access/Adherence  Current Pharmacy:  [place] - [facility] [address] [place] [address] Phone: [phone] Fax: [phone]  [facility] - [facility] [address] [place] [address] Phone: [phone] Fax: [phone]  [facility] [address] [place] [address] Phone: [phone] Fax: [phone]   Patient reports affordability concerns with their medications: Yes  - currently uninsured, unclear if she may be gaining Medicare through disability soon. States she has a Medicare Red white and blue card. Instructed her to bring this to her follow-up visit.  Patient reports access/transportation concerns to their pharmacy: Yes  - it is difficult for her to get to the pharmacy. Last filled medications via [facility] DOH supply (mail order due to lack of transportation)  Has supplies of following meds through 03/24/23:  - Amlodipine  5 mg daily - Atorvastatin  40 mg daily - lisinopril  40 mg daily - metformin  500 mg BID - paroxetine  10 mg daily - Other meds on her list were not feasible to obtain via [facility] supply   Patient reports adherence concerns with their medications:  Yes  - due to financial barriers   Objective:  BP Readings from Last 3 Encounters:  04/24/23 (!) 167/99  04/14/23 (!) 150/93  01/08/23 (!) 164/97    Lab Results  Component Value Date   HGBA1C 5.7 (A) 04/14/2023   HGBA1C 5.3 01/08/2023   HGBA1C  5.2 07/09/2022       Latest Ref Rng & Units 01/08/2023    8:54 AM 07/09/2022    9:17 AM 12/20/2021    4:12 PM  BMP  Glucose 70 - 99 mg/dL 98  894  308   BUN 6 - 24 mg/dL 7  13  6    Creatinine 0.57 - 1.00 mg/dL 9.32  9.14  9.16   BUN/Creat Ratio 9 - 23 10  15  7    Sodium 134 - 144 mmol/L 146  140  126    Potassium 3.5 - 5.2 mmol/L 4.0  4.0  4.1   Chloride 96 - 106 mmol/L 107  97  82   CO2 20 - 29 mmol/L 22  23  21    Calcium  8.7 - 10.2 mg/dL 9.2  88.8  9.5     Lab Results  Component Value Date   CHOL 318 (H) 07/09/2022   HDL 123 07/09/2022   LDLCALC 179 (H) 07/09/2022   TRIG 101 07/09/2022   CHOLHDL 2.6 07/09/2022    Medications Reviewed Today     Reviewed by Brinda Lorain SQUIBB, RPH (Pharmacist) on 02/03/24 at 1421  Med List Status: <None>   Medication Order Taking? Sig Documenting Provider Last Dose Status Informant  Accu-Chek Softclix Lancets lancets [id number]  Use to monitor blood sugar levels once daily.  Patient not taking: Reported on 11/03/2023   Oley Bascom RAMAN, NP  Active            Med Note MAYER, [facility] R   Tue Jul 28, 2023  1:16 PM) Would like to start using BGM  amLODipine  (NORVASC ) 5 MG tablet [id number] Yes Take 1 tablet (5 mg total) by mouth daily. Oley Bascom RAMAN, NP  Active   aspirin  81 MG chewable tablet [id number]  Chew 81 mg by mouth daily. [provider]  Active   atorvastatin  (LIPITOR) 40 MG tablet [id number] Yes Take 1 tablet (40 mg total) by mouth daily. Oley Bascom RAMAN, NP  Active   cariprazine  (VRAYLAR ) 3 MG capsule [id number]  Take 1 capsule (3 mg total) by mouth daily.   Active   cholecalciferol (VITAMIN D3) 25 MCG (1000 UNIT) tablet [id number]  Take 1,000 Units by mouth daily. [provider]  Active   clotrimazole -betamethasone  (LOTRISONE ) cream [id number]  Apply 1 Application topically daily. Sikora, Rebecca, DPM  Active   gabapentin  (NEURONTIN ) 300 MG capsule [id number]  Take 1 capsule (300 mg total) by mouth 3 (three) times daily. Oley Bascom RAMAN, NP  Active   Glucose Blood (BLOOD GLUCOSE TEST STRIPS) STRP [id number]  Use to monitor blood sugar levels once daily. Oley Bascom RAMAN, NP  Active   hydrOXYzine  (ATARAX ) 25 MG tablet [id number]  Take 1 tablet (25 mg total) by mouth 3 (three) times daily as needed for anxiety   Active   ibuprofen  (ADVIL) 200 MG tablet [id number]  Take 200 mg by mouth daily as needed for mild pain (pain score 1-3). [provider]  Active   ketoconazole  (NIZORAL ) 2 % cream [id number]  Apply 1 Application topically daily. Sikora, Rebecca, DPM  Active   lisinopril  (ZESTRIL ) 40 MG tablet [id number] Yes Take 1 tablet (40 mg total) by mouth daily. Oley Bascom RAMAN, NP  Active   metFORMIN  (GLUCOPHAGE ) 500 MG tablet [id number] Yes Take 1 tablet (500 mg total) by mouth 2 (two) times daily with a meal. Nichols, Tonya S, NP  Active   Multiple Vitamin (MULTIVITAMIN WITH MINERALS) TABS tablet [id number]  Take 1  tablet by mouth daily. Oley Bascom RAMAN, NP  Active    Patient not taking:   Discontinued 02/03/24 1421 (Not covered by the pt's insurance)            Med Note MAYER, [facility] R   Tue Jul 28, 2023  1:20 PM) Not taking the medication regularly   Patient not taking:   Discontinued 02/03/24 1421 (Not covered by the pt's insurance)   PARoxetine  (PAXIL ) 20 MG tablet [id number] Yes Take 0.5 tablets (10 mg total) by mouth daily. Oley Bascom RAMAN, NP  Active   silver  sulfADIAZINE  (SSD) 1 % cream [id number]  Apply 1 Application topically daily. Oley Bascom RAMAN, NP  Active   traZODone  (DESYREL ) 100 MG tablet [id number]  Take 0.5 tablets (50 mg total) by mouth at bedtime as needed for sleep. Oley Bascom RAMAN, NP  Active   triamcinolone  ointment (KENALOG ) 0.5 % [id number]  Apply 1 Application topically 2 (two) times daily. Oley Bascom RAMAN, NP  Active               Assessment/Plan:   Medication Access:  - At visit on 12/31/23 - assisted in filling out form for Medicare Extra Help, as patient reported that she recently got a Medicare Red/white/blue card. She was instructed to look out for communication from the social security office in the mail. Patient says she has not received communication, and it does not appear she has been assigned part D benefits at this time.  - Currently using [facility] DOH supply for mail order  for medications. At PCP f/u will have her sign forms for [facility], as they have a larger DOH formularly.  - Rescheduled PCP appt for 03/02/24 (first available). Patient requests appt reminder to be MAILED to her. Will coordinate with CMA to mail this out from the clinic.  - Recommend getting BMP, lipid panel, A1C and UACR at PCP appt.   Follow Up Plan:  PharmD telephone 02/03/24 PCP clinic visit 01/21/24   Lorain Baseman, PharmD [facility] [phone]

## 2024-03-01 ENCOUNTER — Other Ambulatory Visit (HOSPITAL_COMMUNITY): Payer: Self-pay

## 2024-03-02 ENCOUNTER — Ambulatory Visit: Payer: Self-pay | Admitting: Nurse Practitioner

## 2024-03-09 ENCOUNTER — Other Ambulatory Visit: Payer: Self-pay

## 2024-03-09 ENCOUNTER — Emergency Department (HOSPITAL_COMMUNITY)
Admission: EM | Admit: 2024-03-09 | Discharge: 2024-03-09 | Discharge status: Against Medical Advice | Attending: Emergency Medicine | Admitting: Emergency Medicine

## 2024-03-09 ENCOUNTER — Encounter (HOSPITAL_COMMUNITY): Payer: Self-pay | Admitting: Emergency Medicine

## 2024-03-09 ENCOUNTER — Emergency Department (HOSPITAL_COMMUNITY)

## 2024-03-09 DIAGNOSIS — R0981 Nasal congestion: Secondary | ICD-10-CM | POA: Insufficient documentation

## 2024-03-09 DIAGNOSIS — R0602 Shortness of breath: Secondary | ICD-10-CM | POA: Insufficient documentation

## 2024-03-09 DIAGNOSIS — Z5321 Procedure and treatment not carried out due to patient leaving prior to being seen by health care provider: Secondary | ICD-10-CM | POA: Diagnosis not present

## 2024-03-09 DIAGNOSIS — M791 Myalgia, unspecified site: Secondary | ICD-10-CM | POA: Diagnosis not present

## 2024-03-09 LAB — RESP PANEL BY RT-PCR (RSV, FLU A&B, COVID)  RVPGX2
Influenza A by PCR: NEGATIVE
Influenza B by PCR: NEGATIVE
Resp Syncytial Virus by PCR: NEGATIVE
SARS Coronavirus 2 by RT PCR: NEGATIVE

## 2024-03-09 NOTE — ED Triage Notes (Signed)
 Quick triage note:  Pt to ED c/o I dont feel good Reports congestion, runny nose, body aches, Reports son dx with flu.

## 2024-03-09 NOTE — ED Notes (Signed)
 Pt stated she is leaving due to wait time.

## 2024-03-22 ENCOUNTER — Other Ambulatory Visit: Payer: Self-pay

## 2024-03-22 ENCOUNTER — Telehealth: Payer: Self-pay

## 2024-03-22 NOTE — Telephone Encounter (Signed)
 Attempted to contact patient for scheduled appointment for medication management. Left HIPAA compliant message for patient to return my call at their convenience.   Lorain Baseman, PharmD [facility] [phone]

## 2024-03-22 NOTE — Progress Notes (Deleted)
 "  03/22/2024 Name: Sharon Hunt MRN: [id number] DOB: [birth year]  No chief complaint on file.   Sharon Hunt is a 54 y.o. year old female who presented for a telephone visit.   They were referred to the pharmacist by their PCP for assistance in managing diabetes and hypertension. PMH includes HTN, GERD with esophagitis, T2DM, neuropathy, alcohol and tobacco use, HLD, QT prolongation.   Subjective: Patient was last seen by PCP, Sharon Borer, NP, on 04/14/23. At last visit, BP was 150/93 mmHg, HR 92 bpm. A1C was controlled at 5.7%. No medication changes were made. Patient was last engaged by pharmacy via telephone on 07/28/23. She reported home BP reading on 103/76 mmHg. She was counseled on smoking cessation. The pharmacist attempted to request refills with the patient from the pharmacy, but they were getting a rejection on billing her Medicaid. Patient was given instructions to call Medicaid and update current insurance coverage. Patient was engaged by pharmacy again via telephone on 10/06/23. She reported living in her car and her medications were everywhere. I attempted to do a three way call with her and her Medicaid to troubleshoot insurance coverage, but I was not able to reach her for follow-up. Patient had pharmacy telephone f/u on 12/16/23. She reported she had an apartment now, had been rationing her medications. We attempted to fill her meds with  [facility] but enrollment had expired, therefore had a limited number of her meds filled and sent out from [facility] pharmacy (more limited DOH stock). Also assisted patient in applying to Medicare LIS. At bief telephone f/u on 12/31/23, patient reported she received her medications from [facility] pharmacy in the mail. Unfortunately, she did not show to PCP appt scheduled for 01/21/24 or 01/22/24.  Today, patient reports doing ok. Willing to reschedule PCP appt but requests letter in the mail with appt date and time.  Care Team: Primary Care Provider:  Borer Sharon RAMAN, NP ; Next Scheduled Visit: needs to be scheduled  Medication Access/Adherence  Current Pharmacy:  [place] [facility] [address] [place] [address] Phone: [phone] Fax: [phone]  [place]aron Hunt - [facility] [address] [place] [address] Phone: [phone] Fax: [phone]  [facility] - [facility] [address] [place] [address] Phone: [phone] Fax: [phone]   Patient reports affordability concerns with their medications: Yes  - currently uninsured, unclear if she may be gaining Medicare through disability soon. States she has a Medicare Red white and blue card. Instructed her to bring this to her follow-up visit.  Patient reports access/transportation concerns to their pharmacy: Yes  - it is difficult for her to get to the pharmacy. Last filled medications via [facility] DOH supply (mail order due to lack of transportation)  Has supplies of following meds through 03/24/23:  - Amlodipine  5 mg daily - Atorvastatin  40 mg daily - lisinopril  40 mg daily - metformin  500 mg BID - paroxetine  10 mg daily - Other meds on her list were not feasible to obtain via [facility] supply   Patient reports adherence concerns with their medications:  Yes  - due to financial barriers   Objective:  BP Readings from Last 3 Encounters:  03/09/24 (!) 198/126  04/24/23 (!) 167/99  04/14/23 (!) 150/93    Lab Results  Component Value Date   HGBA1C 5.7 (A) 04/14/2023   HGBA1C 5.3 01/08/2023   HGBA1C 5.2 07/09/2022  Latest Ref Rng & Units 01/08/2023    8:54 AM 07/09/2022    9:17 AM 12/20/2021    4:12 PM  BMP  Glucose 70 - 99 mg/dL 98  894  308   BUN 6 - 24 mg/dL 7  13  6    Creatinine 0.57 - 1.00 mg/dL 9.32  9.14  9.16   BUN/Creat Ratio 9 - 23 10  15  7    Sodium 134 - 144 mmol/L 146  140  126   Potassium 3.5 - 5.2 mmol/L 4.0  4.0  4.1    Chloride 96 - 106 mmol/L 107  97  82   CO2 20 - 29 mmol/L 22  23  21    Calcium  8.7 - 10.2 mg/dL 9.2  88.8  9.5     Lab Results  Component Value Date   CHOL 318 (H) 07/09/2022   HDL 123 07/09/2022   LDLCALC 179 (H) 07/09/2022   TRIG 101 07/09/2022   CHOLHDL 2.6 07/09/2022    Medications Reviewed Today   Medications were not reviewed in this encounter       Assessment/Plan:   Medication Access:  - At visit on 12/31/23 - assisted in filling out form for Medicare Extra Help, as patient reported that she recently got a Medicare Red/white/blue card. She was instructed to look out for communication from the social security office in the mail. Patient says she has not received communication, and it does not appear she has been assigned part D benefits at this time.  - Currently using [facility] DOH supply for mail order for medications. At PCP f/u will have her sign forms for [facility], as they have a larger DOH formularly.  - Rescheduled PCP appt for 03/02/24 (first available). Patient requests appt reminder to be MAILED to her. Will coordinate with CMA to mail this out from the clinic.  - Recommend getting BMP, lipid panel, A1C and UACR at PCP appt.   Follow Up Plan:  PharmD telephone 02/03/24 PCP clinic visit 01/21/24   Sharon Hunt, PharmD [facility] [phone]   "

## 2024-03-23 ENCOUNTER — Telehealth: Payer: Self-pay | Admitting: *Deleted

## 2024-03-23 DIAGNOSIS — E1140 Type 2 diabetes mellitus with diabetic neuropathy, unspecified: Secondary | ICD-10-CM

## 2024-03-23 DIAGNOSIS — I10 Essential (primary) hypertension: Secondary | ICD-10-CM

## 2024-03-23 NOTE — Progress Notes (Signed)
 Complex Care Management Note  Care Guide Note 03/23/2024 Name: Sharon Hunt MRN: [id number] DOB: 15-Jul-1970  Sharon Hunt is a 54 y.o. year old female who sees Oley Bascom RAMAN, NP for primary care. I reached out to Earlean MALVA Pouch by phone today to offer complex care management services.  Ms. Dome was given information about Complex Care Management services today including:   The Complex Care Management services include support from the care team which includes your Nurse Care Manager, Clinical Social Worker, or Pharmacist.  The Complex Care Management team is here to help remove barriers to the health concerns and goals most important to you. Complex Care Management services are voluntary, and the patient may decline or stop services at any time by request to their care team member.   Complex Care Management Consent Status: Patient agreed to services and verbal consent obtained.   Follow up plan:  Telephone appointment with complex care management team member scheduled for:  1/12 with BSW 1/21 PCP appt and 3/3 with PharmD  // Encounter Outcome:  Patient Scheduled  [name]  [facility]  [facility] Guide  Direct Dial: [phone]  Fax [phone]

## 2024-03-23 NOTE — Progress Notes (Signed)
 Complex Care Management Care Guide Note  03/23/2024 Name: Sharon Hunt MRN: [id number] DOB: Oct 27, 1970  Sharon Hunt is a 54 y.o. year old female who is a primary care patient of Oley Bascom RAMAN, NP and is actively engaged with the care management team. I reached out to Earlean MALVA Pouch by phone today to assist with re-scheduling  with the Pharmacist.  Follow up plan: Unsuccessful telephone outreach attempt made.   [name]  [facility]  Value-Based Care Institute, [facility] Guide  Direct Dial: [phone]  Fax [phone]

## 2024-03-28 ENCOUNTER — Inpatient Hospital Stay: Payer: Self-pay | Admitting: Nurse Practitioner

## 2024-03-28 ENCOUNTER — Other Ambulatory Visit: Payer: Self-pay

## 2024-03-28 NOTE — Patient Outreach (Signed)
 Social Drivers of Health  Community Resource and Care Coordination Visit Note   03/28/2024  Name: Sharon Hunt MRN: [id number] DOB:[birth year]  Situation: Referral received for [facility] needs assessment and assistance related to Transportation. I obtained verbal consent from Patient.  Visit completed with Patient on the phone.   Background:      Assessment:   BSW outreached patient today to assist with transportation needs. During the call, patient reported that she does not currently have transportation and is unsure of her insurance coverage. Patient stated that she now has [employer] but may also have Blue Cross Blue Shield. BSW provided the patient with the customer service phone numbers for both insurance plans and encouraged her to contact them to confirm active coverage and available benefits, including transportation services and [employer] food card benefits. Patient stated that she plans to call customer service with her daughter to clarify her insurance coverage and determine eligibility for transportation services and food card benefits. BSW scheduled a follow-up call with the patient for January 13 at 10:00 AM to assist with benefit clarification and help patient set up transportation for her upcoming appointment on January 21.      Goals Addressed             This Visit's Progress    BSW Goal       Current SDOH Barriers:  Transportation  Interventions: Patient interviewed and appropriate screenings performed Referred patient to community resources  Provided patient with information about BCBS/Humana  Discussed plans with patient for ongoing follow up and provided patient with direct contact number BSW scheduled follow up with patient to book transportation          Recommendation:   attend all scheduled provider appointments call for transportation assistance at least one week before appointments ask for help if you don't understand your health insurance  benefits call the Suicide and Crisis Lifeline: 988 call the [place]  National Suicide Prevention Lifeline: 234-219-1357 or TTY: (657) 734-3065 TTY 304-311-7890) to talk to a trained counselor call 1-800-273-TALK (toll free, 24 hour hotline) go to [facility] [address] [place] [phone]) call 911 if you need help  Follow Up Plan:   Telephone follow-up in 1 day  Orlean Fey, HEDWIG [facility] Health  Value Based Care Institute Social Worker, [employer] [phone]

## 2024-03-28 NOTE — Patient Instructions (Signed)
 Visit Information  Thank you for taking time to visit with me today. Please don't hesitate to contact me if I can be of assistance to you before our next scheduled appointment.  Our next appointment is by telephone on 03/29/24 at 10am Please call the care guide team at [phone] if you need to cancel or reschedule your appointment.   Following is a copy of your care plan:   Goals Addressed             This Visit's Progress    BSW Goal       Current SDOH Barriers:  Transportation  Interventions: Patient interviewed and appropriate screenings performed Referred patient to community resources  Provided patient with information about BCBS/Humana  Discussed plans with patient for ongoing follow up and provided patient with direct contact number BSW scheduled follow up with patient to book transportation          Please call the Suicide and Crisis Lifeline: 988 call the [place]  National Suicide Prevention Lifeline: 9131774294 or TTY: (726) 456-2678 TTY (905)805-4786) to talk to a trained counselor call 1-800-273-TALK (toll free, 24 hour hotline) go to [facility] [address], [place] [phone]) call 911 if you are experiencing a Mental Health or Behavioral Health Crisis or need someone to talk to.  Patient verbalized understanding of Care plan and visit instructions communicated this visit  Orlean Fey, BSW [facility]  Value Based Care Institute Social Worker, [employer] [facility] [phone]

## 2024-03-29 ENCOUNTER — Other Ambulatory Visit: Payer: Self-pay

## 2024-03-29 NOTE — Patient Instructions (Signed)
 Ladasha MALVA Pouch - I am sorry I was unable to reach you today for our scheduled appointment. I work with Oley Bascom RAMAN, NP and am calling to support your healthcare needs. Please contact me at [phone] at your earliest convenience. I look forward to speaking with you soon.   Thank you,  Orlean Fey, BSW [place]  Value Based Care Institute Social Worker, [employer] [phone]

## 2024-03-30 ENCOUNTER — Other Ambulatory Visit: Payer: Self-pay

## 2024-03-30 NOTE — Patient Instructions (Signed)
 Sharon Hunt - I am sorry I was unable to reach you today for our scheduled appointment. I work with Oley Bascom RAMAN, NP and am calling to support your healthcare needs. Please contact me at [phone] at your earliest convenience. I look forward to speaking with you soon.   Thank you,  Orlean Fey, BSW [place]  Value Based Care Institute Social Worker, [employer] [phone]

## 2024-04-01 ENCOUNTER — Other Ambulatory Visit: Payer: Self-pay

## 2024-04-01 NOTE — Patient Instructions (Signed)
 Visit Information  Thank you for taking time to visit with me today. Please don't hesitate to contact me if I can be of assistance to you before our next scheduled appointment.  Your next care management appointment is by telephone on 1/22 at 3pm  Telephone follow-up in 1 week  Please call the care guide team at [phone] if you need to cancel, schedule, or reschedule an appointment.   Please call the Suicide and Crisis Lifeline: 988 call the [place]  National Suicide Prevention Lifeline: 504-655-4583 or TTY: (431) 788-2171 TTY 220-158-1742) to talk to a trained counselor call 1-800-273-TALK (toll free, 24 hour hotline) go to [facility] [address], [place] [phone]) call 911 if you are experiencing a Mental Health or Behavioral Health Crisis or need someone to talk to.  Sharon Hunt, BSW [place]  Value Based Care Institute Social Worker, [employer] [facility] [phone]

## 2024-04-01 NOTE — Patient Outreach (Signed)
 Social D[employer] of Health  Community Resource and Care Coordination Visit Note   04/01/2024  Name: Sharon Hunt MRN: [id number] DOB:30-Jan-1971  Situation: Referral received for [facility] needs assessment and assistance related to Transportation. I obtained verbal consent from Patient.  Visit completed with Patient on the phone.   Background:      Assessment:   BSW outreached patient to follow up on whether she had contacted her insurance with assistance from her daughter to clarify her coverage. Patient stated that she and her daughter did contact the insurance companies but were still unclear about her current coverage.During the call, BSW contacted [employer] [facility]) and confirmed with a representative that the patient no longer has an active Medicaid plan with [employer], which explains why she is no longer receiving transportation benefits. BSW then contacted [employer] [facility]) and confirmed that the patients new Medicare coverage became effective on March 17, 2024. [employer] confirmed that her current plan does not include transportation benefits. BSW was able to clarify and confirm the patients active insurance coverage. Due to the lack of transportation benefits, patients daughter will provide transportation to the patients upcoming appointment on January 21 at 1:20 PM with Dr. Oley.Patient and her daughter plan to visit Social Services to speak with a Medicaid worker to explore Medicaid re-enrollment and to better understand why her coverage switched. BSW will follow up with the patient on January 22 at 3:00 PM.     Goals Addressed   None     Recommendation:   attend all scheduled provider appointments ask for help if you don't understand your health insurance benefits  Follow Up Plan:   Telephone follow-up 1/22 3pm  Orlean Fey, BSW [place]  [facility] Social Worker, [employer] [facility] [phone]

## 2024-04-06 ENCOUNTER — Other Ambulatory Visit: Payer: Self-pay | Admitting: *Deleted

## 2024-04-06 ENCOUNTER — Inpatient Hospital Stay: Admitting: Nurse Practitioner

## 2024-04-06 NOTE — Patient Instructions (Signed)
 Sharon Hunt - I am sorry I was unable to reach you today for our scheduled appointment. I work with Oley Bascom RAMAN, NP and am calling to support your healthcare needs. Please contact me at [phone] at your earliest convenience. I look forward to speaking with you soon.   Thank you,  Violet Seabury, RN, BSN, ACM RN Care Manager [employer] [phone]

## 2024-04-07 ENCOUNTER — Encounter: Payer: Self-pay | Admitting: Nurse Practitioner

## 2024-04-07 ENCOUNTER — Other Ambulatory Visit: Payer: Self-pay

## 2024-04-07 NOTE — Patient Outreach (Signed)
 Social Drivers of Health  Community Resource and Care Coordination Visit Note   04/07/2024  Name: Sharon Hunt MRN: [id number] DOB:[birth year]  Situation: Referral received for [facility] needs assessment and assistance related to Transportation. I obtained verbal consent from Patient.  Visit completed with Patient on the phone.   Background:      Assessment:   BSW outreached patient today to follow up regarding transportation for her scheduled appointment on January 21. Patient stated that she did not attend the appointment due to lack of transportation. BSW reviewed patients insurance coverage and explained that she no longer has active Medicaid coverage with [employer]. BSW and patient called [employer] together, and [employer] confirmed that patient no longer has coverage through their plan. Patient currently has [facility], which does not include transportation benefits for her appointments. Patient stated that she would like to reach back out to [facility] to further clarify her coverage. BSW provided patient with the [employer] customer service phone number. BSW also recommended that patient visit Social Services to speak with a Medicaid worker regarding her coverage options and possible enrollment. BSW will follow up with patient on February 2 at 3:00 PM.     Recommendation:   attend all scheduled provider appointments ask for help if you don't understand your health insurance benefits  Follow Up Plan:   Telephone follow-up 04/18/24 at 3pm  Orlean Fey, BSW [place]  [facility] Social Worker, [employer] [facility] [phone]

## 2024-04-07 NOTE — Patient Instructions (Signed)
 Visit Information  Thank you for taking time to visit with me today. Please don't hesitate to contact me if I can be of assistance to you before our next scheduled appointment.  Your next care management appointment is by telephone on 04/18/24 at 3pm  Telephone follow-up 04/18/24 at 3pm  Please call the care guide team at [phone] if you need to cancel, schedule, or reschedule an appointment.   Please call the Suicide and Crisis Lifeline: 988 call the [place]  National Suicide Prevention Lifeline: (325) 036-9034 or TTY: 626-760-3018 TTY 7626486745) to talk to a trained counselor call 1-800-273-TALK (toll free, 24 hour hotline) go to [facility] [address], [place] [phone]) call 911 if you are experiencing a Mental Health or Behavioral Health Crisis or need someone to talk to.  Sharon Hunt, BSW [place]  [facility] Social Worker, [employer] [facility] [phone]

## 2024-04-18 ENCOUNTER — Other Ambulatory Visit: Payer: Self-pay

## 2024-04-18 NOTE — Patient Instructions (Signed)
 Sharon Hunt - I am sorry I was unable to reach you today for our scheduled appointment. I work with Oley Bascom RAMAN, NP and am calling to support your healthcare needs. Please contact me at [phone] at your earliest convenience. I look forward to speaking with you soon.   Thank you,  Orlean Fey, BSW [place]  Value Based Care Institute Social Worker, [employer] [phone]

## 2024-04-25 ENCOUNTER — Telehealth

## 2024-04-25 ENCOUNTER — Telehealth: Payer: Self-pay | Admitting: *Deleted

## 2024-05-11 ENCOUNTER — Ambulatory Visit: Payer: Self-pay | Admitting: Nurse Practitioner

## 2024-05-17 ENCOUNTER — Other Ambulatory Visit: Payer: Self-pay
